# Patient Record
Sex: Female | Born: 1995
Health system: Southern US, Community
[De-identification: ages and names within clinical notes are randomized; demographics above are authoritative.]

## PROBLEM LIST (undated history)

## (undated) ENCOUNTER — Inpatient Hospital Stay (HOSPITAL_COMMUNITY): Payer: Self-pay

## (undated) DIAGNOSIS — R51 Headache: Secondary | ICD-10-CM

## (undated) DIAGNOSIS — F32A Depression, unspecified: Secondary | ICD-10-CM

## (undated) DIAGNOSIS — F329 Major depressive disorder, single episode, unspecified: Secondary | ICD-10-CM

## (undated) DIAGNOSIS — I10 Essential (primary) hypertension: Secondary | ICD-10-CM

## (undated) DIAGNOSIS — K589 Irritable bowel syndrome without diarrhea: Secondary | ICD-10-CM

## (undated) DIAGNOSIS — E282 Polycystic ovarian syndrome: Secondary | ICD-10-CM

## (undated) DIAGNOSIS — N979 Female infertility, unspecified: Secondary | ICD-10-CM

## (undated) DIAGNOSIS — F419 Anxiety disorder, unspecified: Secondary | ICD-10-CM

## (undated) DIAGNOSIS — O133 Gestational [pregnancy-induced] hypertension without significant proteinuria, third trimester: Secondary | ICD-10-CM

## (undated) DIAGNOSIS — R519 Headache, unspecified: Secondary | ICD-10-CM

## (undated) HISTORY — DX: Polycystic ovarian syndrome: E28.2

## (undated) HISTORY — PX: NO PAST SURGERIES: SHX2092

## (undated) HISTORY — DX: Gestational (pregnancy-induced) hypertension without significant proteinuria, third trimester: O13.3

## (undated) HISTORY — DX: Female infertility, unspecified: N97.9

## (undated) HISTORY — DX: Depression, unspecified: F32.A

## (undated) HISTORY — DX: Anxiety disorder, unspecified: F41.9

---
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
## 2013-02-11 ENCOUNTER — Ambulatory Visit: Payer: Self-pay | Admitting: Family Medicine

## 2014-07-14 ENCOUNTER — Encounter: Payer: Self-pay | Admitting: Obstetrics & Gynecology

## 2014-07-14 ENCOUNTER — Ambulatory Visit (INDEPENDENT_AMBULATORY_CARE_PROVIDER_SITE_OTHER): Payer: Medicaid Other | Admitting: Obstetrics & Gynecology

## 2014-07-14 VITALS — BP 120/89 | HR 91 | Wt 181.2 lb

## 2014-07-14 DIAGNOSIS — IMO0002 Reserved for concepts with insufficient information to code with codable children: Secondary | ICD-10-CM

## 2014-07-14 DIAGNOSIS — Z23 Encounter for immunization: Secondary | ICD-10-CM | POA: Diagnosis not present

## 2014-07-14 DIAGNOSIS — O3680X1 Pregnancy with inconclusive fetal viability, fetus 1: Secondary | ICD-10-CM

## 2014-07-14 DIAGNOSIS — Z9189 Other specified personal risk factors, not elsewhere classified: Secondary | ICD-10-CM

## 2014-07-14 NOTE — Progress Notes (Signed)
18 y.o. G1P0 at 2069w3d by limited clinic scan today that showed gestational and yolk sacs, here to initiate prenatal care.  No fetal pole yet. Patient was told that the formal New OB visit will occur once viability is confirmed; she will return in 2 weeks.  Flu vaccine given.  Told to continue PNVs. [facility] with multiple MDs and other Advanced Practitioners was explained to patient; also emphasized that residents, students are part of our team. Return in two weeks for repeat scan, possible new OB visit.

## 2014-07-14 NOTE — Patient Instructions (Signed)
First Trimester of Pregnancy The first trimester of pregnancy is from week 1 until the end of week 12 (months 1 through 3). A week after a sperm fertilizes an egg, the egg will implant on the wall of the uterus. This embryo will begin to develop into a baby. Genes from you and your partner are forming the baby. The female genes determine whether the baby is a boy or a girl. At 6-8 weeks, the eyes and face are formed, and the heartbeat can be seen on ultrasound. At the end of 12 weeks, all the baby's organs are formed.  Now that you are pregnant, you will want to do everything you can to have a healthy baby. Two of the most important things are to get good prenatal care and to follow your health care provider's instructions. Prenatal care is all the medical care you receive before the baby's birth. This care will help prevent, find, and treat any problems during the pregnancy and childbirth. BODY CHANGES Your body goes through many changes during pregnancy. The changes vary from woman to woman.   You may gain or lose a couple of pounds at first.  You may feel sick to your stomach (nauseous) and throw up (vomit). If the vomiting is uncontrollable, call your health care provider.  You may tire easily.  You may develop headaches that can be relieved by medicines approved by your health care provider.  You may urinate more often. Painful urination may mean you have a bladder infection.  You may develop heartburn as a result of your pregnancy.  You may develop constipation because certain hormones are causing the muscles that push waste through your intestines to slow down.  You may develop hemorrhoids or swollen, bulging veins (varicose veins).  Your breasts may begin to grow larger and become tender. Your nipples may stick out more, and the tissue that surrounds them (areola) may become darker.  Your gums may bleed and may be sensitive to brushing and flossing.  Dark spots or blotches (chloasma,  mask of pregnancy) may develop on your face. This will likely fade after the baby is born.  Your menstrual periods will stop.  You may have a loss of appetite.  You may develop cravings for certain kinds of food.  You may have changes in your emotions from day to day, such as being excited to be pregnant or being concerned that something may go wrong with the pregnancy and baby.  You may have more vivid and strange dreams.  You may have changes in your hair. These can include thickening of your hair, rapid growth, and changes in texture. Some women also have hair loss during or after pregnancy, or hair that feels dry or thin. Your hair will most likely return to normal after your baby is born. WHAT TO EXPECT AT YOUR PRENATAL VISITS During a routine prenatal visit:  You will be weighed to make sure you and the baby are growing normally.  Your blood pressure will be taken.  Your abdomen will be measured to track your baby's growth.  The fetal heartbeat will be listened to starting around week 10 or 12 of your pregnancy.  Test results from any previous visits will be discussed. Your health care provider may ask you:  How you are feeling.  If you are feeling the baby move.  If you have had any abnormal symptoms, such as leaking fluid, bleeding, severe headaches, or abdominal cramping.  If you have any questions. Other tests   that may be performed during your first trimester include:  Blood tests to find your blood type and to check for the presence of any previous infections. They will also be used to check for low iron levels (anemia) and Rh antibodies. Later in the pregnancy, blood tests for diabetes will be done along with other tests if problems develop.  Urine tests to check for infections, diabetes, or protein in the urine.  An ultrasound to confirm the proper growth and development of the baby.  An amniocentesis to check for possible genetic problems.  Fetal screens for  spina bifida and Down syndrome.  You may need other tests to make sure you and the baby are doing well. HOME CARE INSTRUCTIONS  Medicines  Follow your health care provider's instructions regarding medicine use. Specific medicines may be either safe or unsafe to take during pregnancy.  Take your prenatal vitamins as directed.  If you develop constipation, try taking a stool softener if your health care provider approves. Diet  Eat regular, well-balanced meals. Choose a variety of foods, such as meat or vegetable-based protein, fish, milk and low-fat dairy products, vegetables, fruits, and whole grain breads and cereals. Your health care provider will help you determine the amount of weight gain that is right for you.  Avoid raw meat and uncooked cheese. These carry germs that can cause birth defects in the baby.  Eating four or five small meals rather than three large meals a day may help relieve nausea and vomiting. If you start to feel nauseous, eating a few soda crackers can be helpful. Drinking liquids between meals instead of during meals also seems to help nausea and vomiting.  If you develop constipation, eat more high-fiber foods, such as fresh vegetables or fruit and whole grains. Drink enough fluids to keep your urine clear or pale yellow. Activity and Exercise  Exercise only as directed by your health care provider. Exercising will help you:  Control your weight.  Stay in shape.  Be prepared for labor and delivery.  Experiencing pain or cramping in the lower abdomen or low back is a good sign that you should stop exercising. Check with your health care provider before continuing normal exercises.  Try to avoid standing for long periods of time. Move your legs often if you must stand in one place for a long time.  Avoid heavy lifting.  Wear low-heeled shoes, and practice good posture.  You may continue to have sex unless your health care provider directs you  otherwise. Relief of Pain or Discomfort  Wear a good support bra for breast tenderness.   Take warm sitz baths to soothe any pain or discomfort caused by hemorrhoids. Use hemorrhoid cream if your health care provider approves.   Rest with your legs elevated if you have leg cramps or low back pain.  If you develop varicose veins in your legs, wear support hose. Elevate your feet for 15 minutes, 3-4 times a day. Limit salt in your diet. Prenatal Care  Schedule your prenatal visits by the twelfth week of pregnancy. They are usually scheduled monthly at first, then more often in the last 2 months before delivery.  Write down your questions. Take them to your prenatal visits.  Keep all your prenatal visits as directed by your health care provider. Safety  Wear your seat belt at all times when driving.  Make a list of emergency phone numbers, including numbers for family, friends, the hospital, and police and fire departments. General Tips    Ask your health care provider for a referral to a local prenatal education class. Begin classes no later than at the beginning of month 6 of your pregnancy.  Ask for help if you have counseling or nutritional needs during pregnancy. Your health care provider can offer advice or refer you to specialists for help with various needs.  Do not use hot tubs, steam rooms, or saunas.  Do not douche or use tampons or scented sanitary pads.  Do not cross your legs for long periods of time.  Avoid cat litter boxes and soil used by cats. These carry germs that can cause birth defects in the baby and possibly loss of the fetus by miscarriage or stillbirth.  Avoid all smoking, herbs, alcohol, and medicines not prescribed by your health care provider. Chemicals in these affect the formation and growth of the baby.  Schedule a dentist appointment. At home, brush your teeth with a soft toothbrush and be gentle when you floss. SEEK MEDICAL CARE IF:   You have  dizziness.  You have mild pelvic cramps, pelvic pressure, or nagging pain in the abdominal area.  You have persistent nausea, vomiting, or diarrhea.  You have a bad smelling vaginal discharge.  You have pain with urination.  You notice increased swelling in your face, hands, legs, or ankles. SEEK IMMEDIATE MEDICAL CARE IF:   You have a fever.  You are leaking fluid from your vagina.  You have spotting or bleeding from your vagina.  You have severe abdominal cramping or pain.  You have rapid weight gain or loss.  You vomit blood or material that looks like coffee grounds.  You are exposed to German measles and have never had them.  You are exposed to fifth disease or chickenpox.  You develop a severe headache.  You have shortness of breath.  You have any kind of trauma, such as from a fall or a car accident. Document Released: 07/19/2001 Document Revised: 12/09/2013 Document Reviewed: 06/04/2013 Exit[employer] Patient Information 2015 [employer]. This information is not intended to replace advice given to you by your health care provider. Make sure you discuss any questions you have with your health care provider.  

## 2014-07-14 NOTE — Progress Notes (Signed)
Bedside ultrasound shows gestational sac measuring 5 weeks and 3 days.  Yolk sac is present but fetal pole is not identified.

## 2014-07-16 ENCOUNTER — Inpatient Hospital Stay (HOSPITAL_COMMUNITY): Payer: Medicaid Other

## 2014-07-16 ENCOUNTER — Encounter (HOSPITAL_COMMUNITY): Payer: Self-pay | Admitting: *Deleted

## 2014-07-16 ENCOUNTER — Inpatient Hospital Stay (HOSPITAL_COMMUNITY)
Admission: AD | Admit: 2014-07-16 | Discharge: 2014-07-16 | Disposition: A | Discharge status: Home / Self Care | Payer: Medicaid Other | Source: Ambulatory Visit | Attending: Obstetrics & Gynecology | Admitting: Obstetrics & Gynecology

## 2014-07-16 DIAGNOSIS — R109 Unspecified abdominal pain: Secondary | ICD-10-CM | POA: Diagnosis present

## 2014-07-16 DIAGNOSIS — K589 Irritable bowel syndrome without diarrhea: Secondary | ICD-10-CM | POA: Diagnosis not present

## 2014-07-16 DIAGNOSIS — O209 Hemorrhage in early pregnancy, unspecified: Secondary | ICD-10-CM

## 2014-07-16 DIAGNOSIS — O99611 Diseases of the digestive system complicating pregnancy, first trimester: Secondary | ICD-10-CM | POA: Insufficient documentation

## 2014-07-16 DIAGNOSIS — O208 Other hemorrhage in early pregnancy: Secondary | ICD-10-CM | POA: Diagnosis not present

## 2014-07-16 DIAGNOSIS — Z3A01 Less than 8 weeks gestation of pregnancy: Secondary | ICD-10-CM | POA: Diagnosis not present

## 2014-07-16 DIAGNOSIS — O26899 Other specified pregnancy related conditions, unspecified trimester: Secondary | ICD-10-CM

## 2014-07-16 DIAGNOSIS — O9989 Other specified diseases and conditions complicating pregnancy, childbirth and the puerperium: Secondary | ICD-10-CM

## 2014-07-16 LAB — URINALYSIS, ROUTINE W REFLEX MICROSCOPIC
Bilirubin Urine: NEGATIVE
Glucose, UA: NEGATIVE mg/dL
Ketones, ur: NEGATIVE mg/dL
LEUKOCYTES UA: NEGATIVE
Nitrite: NEGATIVE
PROTEIN: NEGATIVE mg/dL
SPECIFIC GRAVITY, URINE: 1.025 (ref 1.005–1.030)
UROBILINOGEN UA: 0.2 mg/dL (ref 0.0–1.0)
pH: 5.5 (ref 5.0–8.0)

## 2014-07-16 LAB — CBC WITH DIFFERENTIAL/PLATELET
BASOS ABS: 0.1 10*3/uL (ref 0.0–0.1)
Basophils Relative: 1 % (ref 0–1)
EOS PCT: 22 % — AB (ref 0–5)
Eosinophils Absolute: 3.1 10*3/uL — ABNORMAL HIGH (ref 0.0–0.7)
HCT: 35.3 % — ABNORMAL LOW (ref 36.0–46.0)
Hemoglobin: 12 g/dL (ref 12.0–15.0)
LYMPHS ABS: 3.1 10*3/uL (ref 0.7–4.0)
LYMPHS PCT: 23 % (ref 12–46)
MCH: 28.7 pg (ref 26.0–34.0)
MCHC: 34 g/dL (ref 30.0–36.0)
MCV: 84.4 fL (ref 78.0–100.0)
Monocytes Absolute: 0.8 10*3/uL (ref 0.1–1.0)
Monocytes Relative: 6 % (ref 3–12)
NEUTROS ABS: 6.7 10*3/uL (ref 1.7–7.7)
NEUTROS PCT: 48 % (ref 43–77)
PLATELETS: 330 10*3/uL (ref 150–400)
RBC: 4.18 MIL/uL (ref 3.87–5.11)
RDW: 13.3 % (ref 11.5–15.5)
WBC: 13.7 10*3/uL — AB (ref 4.0–10.5)

## 2014-07-16 LAB — URINE MICROSCOPIC-ADD ON

## 2014-07-16 LAB — POCT PREGNANCY, URINE: PREG TEST UR: POSITIVE — AB

## 2014-07-16 LAB — WET PREP, GENITAL
Trich, Wet Prep: NONE SEEN
Yeast Wet Prep HPF POC: NONE SEEN

## 2014-07-16 LAB — HCG, QUANTITATIVE, PREGNANCY: HCG, BETA CHAIN, QUANT, S: 12450 m[IU]/mL — AB (ref <5)

## 2014-07-16 NOTE — MAU Note (Signed)
Pt having cramping x 2 days, spotting today.  IVF pregnancy.

## 2014-07-16 NOTE — Discharge Instructions (Signed)
Subchorionic Hematoma °A subchorionic hematoma is a gathering of blood between the outer wall of the placenta and the inner wall of the womb (uterus). The placenta is the organ that connects the fetus to the wall of the uterus. The placenta performs the feeding, breathing (oxygen to the fetus), and waste removal (excretory work) of the fetus.  °Subchorionic hematoma is the most common abnormality found on a result from ultrasonography done during the first trimester or early second trimester of pregnancy. If there has been little or no vaginal bleeding, early small hematomas usually shrink on their own and do not affect your baby or pregnancy. The blood is gradually absorbed over 1-2 weeks. When bleeding starts later in pregnancy or the hematoma is larger or occurs in an older pregnant woman, the outcome may not be as good. Larger hematomas may get bigger, which increases the chances for miscarriage. Subchorionic hematoma also increases the risk of premature detachment of the placenta from the uterus, preterm (premature) labor, and stillbirth. °HOME CARE INSTRUCTIONS °· Stay on bed rest if your health care provider recommends this. Although bed rest will not prevent more bleeding or prevent a miscarriage, your health care provider may recommend bed rest until you are advised otherwise. °· Avoid heavy lifting (more than 10 lb [4.5 kg]), exercise, sexual intercourse, or douching as directed by your health care provider. °· Keep track of the number of pads you use each day and how soaked (saturated) they are. Write down this information. °· Do not use tampons. °· Keep all follow-up appointments as directed by your health care provider. Your health care provider may ask you to have follow-up blood tests or ultrasound tests or both. °SEEK IMMEDIATE MEDICAL CARE IF: °· You have severe cramps in your stomach, back, abdomen, or pelvis. °· You have a fever. °· You pass large clots or tissue. Save any tissue for your health  care provider to look at. °· Your bleeding increases or you become lightheaded, feel weak, or have fainting episodes. °Document Released: 11/09/2006 Document Revised: 12/09/2013 Document Reviewed: 02/21/2013 °ExitCare® Patient Information ©2015 [employer]. This information is not intended to replace advice given to you by your health care provider. Make sure you discuss any questions you have with your health care provider. °First Trimester of Pregnancy °The first trimester of pregnancy is from week 1 until the end of week 12 (months 1 through 3). During this time, your baby will begin to develop inside you. At 6-8 weeks, the eyes and face are formed, and the heartbeat can be seen on ultrasound. At the end of 12 weeks, all the baby's organs are formed. Prenatal care is all the medical care you receive before the birth of your baby. Make sure you get good prenatal care and follow all of your doctor's instructions. °HOME CARE  °Medicines °· Take medicine only as told by your doctor. Some medicines are safe and some are not during pregnancy. °· Take your prenatal vitamins as told by your doctor. °· Take medicine that helps you poop (stool softener) as needed if your doctor says it is okay. °Diet °· Eat regular, healthy meals. °· Your doctor will tell you the amount of weight gain that is right for you. °· Avoid raw meat and uncooked cheese. °· If you feel sick to your stomach (nauseous) or throw up (vomit): °· Eat 4 or 5 small meals a day instead of 3 large meals. °· Try eating a few soda crackers. °· Drink liquids between meals   instead of during meals. °· If you have a hard time pooping (constipation): °· Eat high-fiber foods like fresh vegetables, fruit, and whole grains. °· Drink enough fluids to keep your pee (urine) clear or pale yellow. °Activity and Exercise °· Exercise only as told by your doctor. Stop exercising if you have cramps or pain in your lower belly (abdomen) or low back. °· Try to avoid standing  for long periods of time. Move your legs often if you must stand in one place for a long time. °· Avoid heavy lifting. °· Wear low-heeled shoes. Sit and stand up straight. °· You can have sex unless your doctor tells you not to. °Relief of Pain or Discomfort °· Wear a good support bra if your breasts are sore. °· Take warm water baths (sitz baths) to soothe pain or discomfort caused by hemorrhoids. Use hemorrhoid cream if your doctor says it is okay. °· Rest with your legs raised if you have leg cramps or low back pain. °· Wear support hose if you have puffy, bulging veins (varicose veins) in your legs. Raise (elevate) your feet for 15 minutes, 3-4 times a day. Limit salt in your diet. °Prenatal Care °· Schedule your prenatal visits by the twelfth week of pregnancy. °· Write down your questions. Take them to your prenatal visits. °· Keep all your prenatal visits as told by your doctor. °Safety °· Wear your seat belt at all times when driving. °· Make a list of emergency phone numbers. The list should include numbers for family, friends, the hospital, and police and fire departments. °General Tips °· Ask your doctor for a referral to a local prenatal class. Begin classes no later than at the start of month 6 of your pregnancy. °· Ask for help if you need counseling or help with nutrition. Your doctor can give you advice or tell you where to go for help. °· Do not use hot tubs, steam rooms, or saunas. °· Do not douche or use tampons or scented sanitary pads. °· Do not cross your legs for long periods of time. °· Avoid litter boxes and soil used by cats. °· Avoid all smoking, herbs, and alcohol. Avoid drugs not approved by your doctor. °· Visit your dentist. At home, brush your teeth with a soft toothbrush. Be gentle when you floss. °GET HELP IF: °· You are dizzy. °· You have mild cramps or pressure in your lower belly. °· You have a nagging pain in your belly area. °· You continue to feel sick to your stomach, throw  up, or have watery poop (diarrhea). °· You have a bad smelling fluid coming from your vagina. °· You have pain with peeing (urination). °· You have increased puffiness (swelling) in your face, hands, legs, or ankles. °GET HELP RIGHT AWAY IF:  °· You have a fever. °· You are leaking fluid from your vagina. °· You have spotting or bleeding from your vagina. °· You have very bad belly cramping or pain. °· You gain or lose weight rapidly. °· You throw up blood. It may look like coffee grounds. °· You are around people who have German measles, fifth disease, or chickenpox. °· You have a very bad headache. °· You have shortness of breath. °· You have any kind of trauma, such as from a fall or a car accident. °Document Released: 01/11/2008 Document Revised: 12/09/2013 Document Reviewed: 06/04/2013 °ExitCare® Patient Information ©2015 [employer]. This information is not intended to replace advice given to you by your health   care provider. Make sure you discuss any questions you have with your health care provider. ° °

## 2014-07-16 NOTE — MAU Provider Note (Signed)
History     CSN: [id number]  Arrival date and time: 07/16/14 4098   First Provider Initiated Contact with Patient 07/16/14 2142      Chief Complaint  Patient presents with  . Abdominal Cramping  . Vaginal Bleeding   HPI  [name] is a 18 y.o. G1P0 at [redacted]w[redacted]d who presents to MAU today with complaint of lower abdominal cramping and vaginal bleeding. Patient states that cramping started yesterday and she then noted spotting tonight. She states dark red blood. She denies other vaginal discharge, UTI symptoms or fever. She states mild nausea without vomiting, diarrhea or constipation. Patient recently [facility] care with [facility]. She denies intercourse recently.   OB History    Gravida Para Term Preterm AB TAB SAB Ectopic Multiple Living   1               Past Medical History  Diagnosis Date  . IBS (irritable bowel syndrome)   . Infertility, female   . PCOS (polycystic ovarian syndrome)     No past surgical history on file.  History reviewed. No pertinent family history.  History  Substance Use Topics  . Smoking status: Never Smoker   . Smokeless tobacco: Never Used  . Alcohol Use: No    Allergies: No Known Allergies  No prescriptions prior to admission    Review of Systems  Constitutional: Negative for fever and malaise/fatigue.  Gastrointestinal: Positive for nausea and abdominal pain. Negative for vomiting, diarrhea and constipation.  Genitourinary: Negative for dysuria, urgency and frequency.       + vaginal bleeding Neg - vaginal discharge   Physical Exam   Blood pressure 132/69, pulse 99, temperature 98.2 F (36.8 C), temperature source Oral, resp. rate 16, height 5\' 1"  (1.549 m), weight 181 lb 12.8 oz (82.464 kg), last menstrual period 06/07/2014.  Physical Exam  Constitutional: She is oriented to person, place, and time. She appears well-developed and well-nourished. No distress.  HENT:  Head:  Normocephalic.  Cardiovascular: Normal rate.   Respiratory: Effort normal.  Genitourinary: Uterus is not enlarged (exam limited by maternal body habitus) and not tender. Cervix exhibits friability (mild). Cervix exhibits no motion tenderness and no discharge. Right adnexum displays no mass and no tenderness. Left adnexum displays no mass and no tenderness. There is bleeding (scant light brown blood noted) in the vagina. Vaginal discharge (scant mucous discharge noted) found.  Neurological: She is alert and oriented to person, place, and time.  Skin: Skin is warm and dry. No erythema.  Psychiatric: She has a normal mood and affect.   Results for orders placed or performed during the hospital encounter of 07/16/14 (from the past 24 hour(s))  Urinalysis, Routine w reflex microscopic     Status: Abnormal   Collection Time: 07/16/14  8:40 PM  Result Value Ref Range   Color, Urine YELLOW YELLOW   APPearance CLEAR CLEAR   Specific Gravity, Urine 1.025 1.005 - 1.030   pH 5.5 5.0 - 8.0   Glucose, UA NEGATIVE NEGATIVE mg/dL   Hgb urine dipstick TRACE (A) NEGATIVE   Bilirubin Urine NEGATIVE NEGATIVE   Ketones, ur NEGATIVE NEGATIVE mg/dL   Protein, ur NEGATIVE NEGATIVE mg/dL   Urobilinogen, UA 0.2 0.0 - 1.0 mg/dL   Nitrite NEGATIVE NEGATIVE   Leukocytes, UA NEGATIVE NEGATIVE  Urine microscopic-add on     Status: Abnormal   Collection Time: 07/16/14  8:40 PM  Result Value Ref Range   Squamous Epithelial /  LPF FEW (A) RARE   WBC, UA 3-6 <3 WBC/hpf   RBC / HPF 3-6 <3 RBC/hpf   Bacteria, UA FEW (A) RARE  Pregnancy, urine POC     Status: Abnormal   Collection Time: 07/16/14  8:49 PM  Result Value Ref Range   Preg Test, Ur POSITIVE (A) NEGATIVE  CBC with Differential     Status: Abnormal   Collection Time: 07/16/14  9:42 PM  Result Value Ref Range   WBC 13.7 (H) 4.0 - 10.5 K/uL   RBC 4.18 3.87 - 5.11 MIL/uL   Hemoglobin 12.0 12.0 - 15.0 g/dL   HCT 16.135.3 (L) 09.636.0 - 04.546.0 %   MCV 84.4 78.0 -  100.0 fL   MCH 28.7 26.0 - 34.0 pg   MCHC 34.0 30.0 - 36.0 g/dL   RDW 40.913.3 81.111.5 - 91.415.5 %   Platelets 330 150 - 400 K/uL   Neutrophils Relative % 48 43 - 77 %   Neutro Abs 6.7 1.7 - 7.7 K/uL   Lymphocytes Relative 23 12 - 46 %   Lymphs Abs 3.1 0.7 - 4.0 K/uL   Monocytes Relative 6 3 - 12 %   Monocytes Absolute 0.8 0.1 - 1.0 K/uL   Eosinophils Relative 22 (H) 0 - 5 %   Eosinophils Absolute 3.1 (H) 0.0 - 0.7 K/uL   Basophils Relative 1 0 - 1 %   Basophils Absolute 0.1 0.0 - 0.1 K/uL  ABO/Rh     Status: None (Preliminary result)   Collection Time: 07/16/14  9:42 PM  Result Value Ref Range   ABO/RH(D) O POS   hCG, quantitative, pregnancy     Status: Abnormal   Collection Time: 07/16/14  9:48 PM  Result Value Ref Range   hCG, Beta Chain, Quant, S 12450 (H) <5 mIU/mL  Wet prep, genital     Status: Abnormal   Collection Time: 07/16/14  9:53 PM  Result Value Ref Range   Yeast Wet Prep HPF POC NONE SEEN NONE SEEN   Trich, Wet Prep NONE SEEN NONE SEEN   Clue Cells Wet Prep HPF POC FEW (A) NONE SEEN   WBC, Wet Prep HPF POC FEW (A) NONE SEEN   [place] Ob Comp Less 14 Wks  07/16/2014   CLINICAL DATA:  Vaginal bleeding, pregnancy, in vitro fertilization, EGA [redacted] weeks 4 days  EXAM: OBSTETRIC <14 WK US AND TRANSVAGINAL OB US  TECHNIQUE: Both transabdominal and transvaginal ultrasound examinations were performed for complete evaluation of the gestation as well as the maternal uterus, adnexal regions, and pelvic cul-de-sac. Transvaginal technique was performed to assess early pregnancy.  COMPARISON:  None  FINDINGS: Intrauterine gestational sac: Visualized/normal in shape.  Yolk sac:  Present  Embryo:  Present  Cardiac Activity: Present  Heart Rate:  100 bpm  CRL:   2.1  mm   5 w 6 d                  US EDC: 03/11/2015  Maternal uterus/adnexae: Small subchorionic hemorrhage.  LEFT ovary normal size and morphology, 2.3 x 4.6 x 1.9 cm.  RIGHT ovary normal size and morphology, 3.1 x 3.0 x 2.5 cm.  No adnexal  masses.  Trace free pelvic fluid.  IMPRESSION: Single live intrauterine gestational measured at 5 weeks 6 days EGA by crown-rump length.  Small subchorionic hemorrhage.   Electronically Signed   By: Ulyses SouthwardMark  Boles M.D.   On: 07/16/2014 22:39   [place] Ob Transvaginal  07/16/2014   CLINICAL  DATA:  Vaginal bleeding, pregnancy, in vitro fertilization, EGA [redacted] weeks 4 days  EXAM: OBSTETRIC <14 WK US AND TRANSVAGINAL OB US  TECHNIQUE: Both transabdominal and transvaginal ultrasound examinations were performed for complete evaluation of the gestation as well as the maternal uterus, adnexal regions, and pelvic cul-de-sac. Transvaginal technique was performed to assess early pregnancy.  COMPARISON:  None  FINDINGS: Intrauterine gestational sac: Visualized/normal in shape.  Yolk sac:  Present  Embryo:  Present  Cardiac Activity: Present  Heart Rate:  100 bpm  CRL:   2.1  mm   5 w 6 d                  US EDC: 03/11/2015  Maternal uterus/adnexae: Small subchorionic hemorrhage.  LEFT ovary normal size and morphology, 2.3 x 4.6 x 1.9 cm.  RIGHT ovary normal size and morphology, 3.1 x 3.0 x 2.5 cm.  No adnexal masses.  Trace free pelvic fluid.  IMPRESSION: Single live intrauterine gestational measured at 5 weeks 6 days EGA by crown-rump length.  Small subchorionic hemorrhage.   Electronically Signed   By: Ulyses SouthwardMark  Boles M.D.   On: 07/16/2014 22:39    MAU Course  Procedures None  MDM +UPT UA, wet prep, GC/Chlamydia, CBC, ABO/Rh, quant hCG, HIV and US today  Assessment and Plan  A: SIUP at [id number] Small subchorionic hemorrhage  P: Discharge home Bleeding and first trimester warning signs/precautions discussed Patient advised [facility] as scheduled Patient may return to MAU as needed or if her condition were to change or worsen   Marny LowensteinJulie N Wenzel, PA-C  07/16/2014, 11:16 PM

## 2014-07-17 LAB — ABO/RH: ABO/RH(D): O POS

## 2014-07-17 LAB — GC/CHLAMYDIA PROBE AMP
CT PROBE, AMP APTIMA: NEGATIVE
GC PROBE AMP APTIMA: NEGATIVE

## 2014-07-17 LAB — HIV ANTIBODY (ROUTINE TESTING W REFLEX): HIV 1&2 Ab, 4th Generation: NONREACTIVE

## 2014-07-20 ENCOUNTER — Inpatient Hospital Stay (HOSPITAL_COMMUNITY): Payer: Medicaid Other

## 2014-07-20 ENCOUNTER — Encounter (HOSPITAL_COMMUNITY): Payer: Self-pay | Admitting: *Deleted

## 2014-07-20 ENCOUNTER — Inpatient Hospital Stay (HOSPITAL_COMMUNITY)
Admission: AD | Admit: 2014-07-20 | Discharge: 2014-07-21 | Disposition: A | Discharge status: Home / Self Care | Payer: Medicaid Other | Source: Ambulatory Visit | Attending: Obstetrics and Gynecology | Admitting: Obstetrics and Gynecology

## 2014-07-20 DIAGNOSIS — Z3A01 Less than 8 weeks gestation of pregnancy: Secondary | ICD-10-CM | POA: Insufficient documentation

## 2014-07-20 DIAGNOSIS — R109 Unspecified abdominal pain: Secondary | ICD-10-CM | POA: Diagnosis present

## 2014-07-20 DIAGNOSIS — O209 Hemorrhage in early pregnancy, unspecified: Secondary | ICD-10-CM | POA: Insufficient documentation

## 2014-07-20 LAB — URINALYSIS, ROUTINE W REFLEX MICROSCOPIC
Bilirubin Urine: NEGATIVE
GLUCOSE, UA: NEGATIVE mg/dL
KETONES UR: NEGATIVE mg/dL
LEUKOCYTES UA: NEGATIVE
Nitrite: NEGATIVE
PROTEIN: NEGATIVE mg/dL
Specific Gravity, Urine: 1.015 (ref 1.005–1.030)
UROBILINOGEN UA: 0.2 mg/dL (ref 0.0–1.0)
pH: 6 (ref 5.0–8.0)

## 2014-07-20 LAB — URINE MICROSCOPIC-ADD ON

## 2014-07-20 NOTE — MAU Note (Signed)
Abdominal cramping & vaginal bleeding worse today. Was seen on Wednesday for same complaint. Having small blood clots now.

## 2014-07-20 NOTE — MAU Provider Note (Signed)
History     CSN: [id number]  Arrival date and time: 07/20/14 2232   First Provider Initiated Contact with Patient 07/20/14 2313      Chief Complaint  Patient presents with  . Vaginal Bleeding  . Abdominal Cramping   HPI  Chelsea Ferguson is a 18 y.o. a G1P0 at 439w1d who presents today with bleeding, and cramping. She states that the bleeding started around 2100. She states that it was heavy, and with small blood clots. She states that the clots were about the size of a grain of rice.   Past Medical History  Diagnosis Date  . IBS (irritable bowel syndrome)   . Infertility, female   . PCOS (polycystic ovarian syndrome)     History reviewed. No pertinent past surgical history.  No family history on file.  History  Substance Use Topics  . Smoking status: Never Smoker   . Smokeless tobacco: Never Used  . Alcohol Use: No    Allergies: No Known Allergies  Prescriptions prior to admission  Medication Sig Dispense Refill Last Dose  . aspirin 81 MG chewable tablet Chew 81 mg by mouth daily.   07/19/2014 at Unknown time  . estradiol valerate (DELESTROGEN) 20 MG/ML injection Inject 20 mg into the muscle 2 (two) times a week. .43mg  every Monday and Friday   Past Week at Unknown time  . hydroxyprogesterone caproate (DELALUTIN) 250 mg/mL OIL injection Inject 250 mg into the muscle daily.    07/19/2014 at Unknown time  . Prenatal Vit-Min-FA-Fish Oil ([facility] PRENATAL GUMMY PO) Take 2 tablets by mouth daily.   07/19/2014 at Unknown time    ROS Physical Exam   Blood pressure 137/87, pulse 87, temperature 98.1 F (36.7 C), temperature source Oral, resp. rate 18, last menstrual period 06/07/2014, SpO2 100 %.  Physical Exam  Nursing note and vitals reviewed. Constitutional: She is oriented to person, place, and time. She appears well-developed and well-nourished. No distress.  Cardiovascular: Normal rate.   Respiratory: Effort normal.  GI: Soft. There is no tenderness. There is no  rebound.  Genitourinary:   External: no lesion Vagina: small amount of blood seen  Cervix: pink, smooth, no CMT Uterus: NSSC Adnexa: NT   Neurological: She is alert and oriented to person, place, and time.  Skin: Skin is warm and dry.  Psychiatric: She has a normal mood and affect.    MAU Course  Procedures  [place] Ob Transvaginal  07/21/2014   CLINICAL DATA:  Vaginal bleeding and pregnancy, first trimester.  EXAM: TRANSVAGINAL OB ULTRASOUND  TECHNIQUE: Transvaginal ultrasound was performed for complete evaluation of the gestation as well as the maternal uterus, adnexal regions, and pelvic cul-de-sac.  COMPARISON:  July 16, 2014.  FINDINGS: Intrauterine gestational sac: Visualized/normal in shape.  Yolk sac:  Visualized.  Embryo:  Visualized.  Cardiac Activity: Visualized.  Heart Rate: 120 bpm  CRL:   6.8  mm   6 w 4 d                  US EDC: March 11, 2015.  Maternal uterus/adnexae: Both ovaries appear normal. Trace free fluid is noted most likely physiologic.  IMPRESSION: Single live intrauterine gestation of 6 weeks 4 days.   Electronically Signed   By: Roque LiasJames  Green M.D.   On: 07/21/2014 00:03     Assessment and Plan   1. Vaginal bleeding in pregnancy, first trimester    Bleeding precautions Return to MAU as needed  Follow-up Information  [facility]   Specialty:  Obstetrics and Gynecology   Why:  As scheduled   Contact information:   [address] [place] [id number] [phone]       Tawnya CrookHogan, Heather Donovan 07/20/2014, 11:16 PM

## 2014-07-21 DIAGNOSIS — O4691 Antepartum hemorrhage, unspecified, first trimester: Secondary | ICD-10-CM

## 2014-07-21 NOTE — Discharge Instructions (Signed)
Vaginal Bleeding During Pregnancy, First Trimester  A small amount of bleeding (spotting) from the vagina is relatively common in early pregnancy. It usually stops on its own. Various things may cause bleeding or spotting in early pregnancy. Some bleeding may be related to the pregnancy, and some may not. In most cases, the bleeding is normal and is not a problem. However, bleeding can also be a sign of something serious. Be sure to tell your health care provider about any vaginal bleeding right away.  Some possible causes of vaginal bleeding during the first trimester include:  · Infection or inflammation of the cervix.  · Growths (polyps) on the cervix.  · Miscarriage or threatened miscarriage.  · Pregnancy tissue has developed outside of the uterus and in a fallopian tube (tubal pregnancy).  · Tiny cysts have developed in the uterus instead of pregnancy tissue (molar pregnancy).  HOME CARE INSTRUCTIONS   Watch your condition for any changes. The following actions may help to lessen any discomfort you are feeling:  · Follow your health care provider's instructions for limiting your activity. If your health care provider orders bed rest, you may need to stay in bed and only get up to use the bathroom. However, your health care provider may allow you to continue light activity.  · If needed, make plans for someone to help with your regular activities and responsibilities while you are on bed rest.  · Keep track of the number of pads you use each day, how often you change pads, and how soaked (saturated) they are. Write this down.  · Do not use tampons. Do not douche.  · Do not have sexual intercourse or orgasms until approved by your health care provider.  · If you pass any tissue from your vagina, save the tissue so you can show it to your health care provider.  · Only take over-the-counter or prescription medicines as directed by your health care provider.  · Do not take aspirin because it can make you  bleed.  · Keep all follow-up appointments as directed by your health care provider.  SEEK MEDICAL CARE IF:  · You have any vaginal bleeding during any part of your pregnancy.  · You have cramps or labor pains.  · You have a fever, not controlled by medicine.  SEEK IMMEDIATE MEDICAL CARE IF:   · You have severe cramps in your back or belly (abdomen).  · You pass large clots or tissue from your vagina.  · Your bleeding increases.  · You feel light-headed or weak, or you have fainting episodes.  · You have chills.  · You are leaking fluid or have a gush of fluid from your vagina.  · You pass out while having a bowel movement.  MAKE SURE YOU:  · Understand these instructions.  · Will watch your condition.  · Will get help right away if you are not doing well or get worse.  Document Released: 05/04/2005 Document Revised: 07/30/2013 Document Reviewed: 04/01/2013  [employer]® Patient Information ©2015 [employer]. This information is not intended to replace advice given to you by your health care provider. Make sure you discuss any questions you have with your health care provider.

## 2014-07-28 ENCOUNTER — Ambulatory Visit (INDEPENDENT_AMBULATORY_CARE_PROVIDER_SITE_OTHER): Payer: Medicaid Other | Admitting: Obstetrics and Gynecology

## 2014-07-28 ENCOUNTER — Other Ambulatory Visit: Payer: Self-pay | Admitting: Obstetrics and Gynecology

## 2014-07-28 ENCOUNTER — Encounter: Payer: Self-pay | Admitting: Obstetrics and Gynecology

## 2014-07-28 VITALS — BP 120/74 | HR 88 | Wt 181.0 lb

## 2014-07-28 DIAGNOSIS — Z3682 Encounter for antenatal screening for nuchal translucency: Secondary | ICD-10-CM

## 2014-07-28 DIAGNOSIS — N970 Female infertility associated with anovulation: Secondary | ICD-10-CM

## 2014-07-28 DIAGNOSIS — Z3401 Encounter for supervision of normal first pregnancy, first trimester: Secondary | ICD-10-CM

## 2014-07-28 DIAGNOSIS — Z3400 Encounter for supervision of normal first pregnancy, unspecified trimester: Secondary | ICD-10-CM | POA: Insufficient documentation

## 2014-07-28 DIAGNOSIS — O99210 Obesity complicating pregnancy, unspecified trimester: Secondary | ICD-10-CM | POA: Insufficient documentation

## 2014-07-28 DIAGNOSIS — O99211 Obesity complicating pregnancy, first trimester: Secondary | ICD-10-CM

## 2014-07-28 DIAGNOSIS — E669 Obesity, unspecified: Secondary | ICD-10-CM | POA: Insufficient documentation

## 2014-07-28 HISTORY — DX: Female infertility associated with anovulation: N97.0

## 2014-07-28 HISTORY — DX: Obesity complicating pregnancy, unspecified trimester: O99.210

## 2014-07-28 HISTORY — DX: Encounter for supervision of normal first pregnancy, unspecified trimester: Z34.00

## 2014-07-28 NOTE — Patient Instructions (Signed)
First Trimester of Pregnancy The first trimester of pregnancy is from week 1 until the end of week 12 (months 1 through 3). A week after a sperm fertilizes an egg, the egg will implant on the wall of the uterus. This embryo will begin to develop into a baby. Genes from you and your partner are forming the baby. The female genes determine whether the baby is a boy or a girl. At 6-8 weeks, the eyes and face are formed, and the heartbeat can be seen on ultrasound. At the end of 12 weeks, all the baby's organs are formed.  Now that you are pregnant, you will want to do everything you can to have a healthy baby. Two of the most important things are to get good prenatal care and to follow your health care provider's instructions. Prenatal care is all the medical care you receive before the baby's birth. This care will help prevent, find, and treat any problems during the pregnancy and childbirth. BODY CHANGES Your body goes through many changes during pregnancy. The changes vary from woman to woman.   You may gain or lose a couple of pounds at first.  You may feel sick to your stomach (nauseous) and throw up (vomit). If the vomiting is uncontrollable, call your health care provider.  You may tire easily.  You may develop headaches that can be relieved by medicines approved by your health care provider.  You may urinate more often. Painful urination may mean you have a bladder infection.  You may develop heartburn as a result of your pregnancy.  You may develop constipation because certain hormones are causing the muscles that push waste through your intestines to slow down.  You may develop hemorrhoids or swollen, bulging veins (varicose veins).  Your breasts may begin to grow larger and become tender. Your nipples may stick out more, and the tissue that surrounds them (areola) may become darker.  Your gums may bleed and may be sensitive to brushing and flossing.  Dark spots or blotches  (chloasma, mask of pregnancy) may develop on your face. This will likely fade after the baby is born.  Your menstrual periods will stop.  You may have a loss of appetite.  You may develop cravings for certain kinds of food.  You may have changes in your emotions from day to day, such as being excited to be pregnant or being concerned that something may go wrong with the pregnancy and baby.  You may have more vivid and strange dreams.  You may have changes in your hair. These can include thickening of your hair, rapid growth, and changes in texture. Some women also have hair loss during or after pregnancy, or hair that feels dry or thin. Your hair will most likely return to normal after your baby is born. WHAT TO EXPECT AT YOUR PRENATAL VISITS During a routine prenatal visit:  You will be weighed to make sure you and the baby are growing normally.  Your blood pressure will be taken.  Your abdomen will be measured to track your baby's growth.  The fetal heartbeat will be listened to starting around week 10 or 12 of your pregnancy.  Test results from any previous visits will be discussed. Your health care provider may ask you:  How you are feeling.  If you are feeling the baby move.  If you have had any abnormal symptoms, such as leaking fluid, bleeding, severe headaches, or abdominal cramping.  If you have any questions. Other tests   that may be performed during your first trimester include:  Blood tests to find your blood type and to check for the presence of any previous infections. They will also be used to check for low iron levels (anemia) and Rh antibodies. Later in the pregnancy, blood tests for diabetes will be done along with other tests if problems develop.  Urine tests to check for infections, diabetes, or protein in the urine.  An ultrasound to confirm the proper growth and development of the baby.  An amniocentesis to check for possible genetic problems.  Fetal  screens for spina bifida and Down syndrome.  You may need other tests to make sure you and the baby are doing well. HOME CARE INSTRUCTIONS  Medicines  Follow your health care provider's instructions regarding medicine use. Specific medicines may be either safe or unsafe to take during pregnancy.  Take your prenatal vitamins as directed.  If you develop constipation, try taking a stool softener if your health care provider approves. Diet  Eat regular, well-balanced meals. Choose a variety of foods, such as meat or vegetable-based protein, fish, milk and low-fat dairy products, vegetables, fruits, and whole grain breads and cereals. Your health care provider will help you determine the amount of weight gain that is right for you.  Avoid raw meat and uncooked cheese. These carry germs that can cause birth defects in the baby.  Eating four or five small meals rather than three large meals a day may help relieve nausea and vomiting. If you start to feel nauseous, eating a few soda crackers can be helpful. Drinking liquids between meals instead of during meals also seems to help nausea and vomiting.  If you develop constipation, eat more high-fiber foods, such as fresh vegetables or fruit and whole grains. Drink enough fluids to keep your urine clear or pale yellow. Activity and Exercise  Exercise only as directed by your health care provider. Exercising will help you:  Control your weight.  Stay in shape.  Be prepared for labor and delivery.  Experiencing pain or cramping in the lower abdomen or low back is a good sign that you should stop exercising. Check with your health care provider before continuing normal exercises.  Try to avoid standing for long periods of time. Move your legs often if you must stand in one place for a long time.  Avoid heavy lifting.  Wear low-heeled shoes, and practice good posture.  You may continue to have sex unless your health care provider directs you  otherwise. Relief of Pain or Discomfort  Wear a good support bra for breast tenderness.   Take warm sitz baths to soothe any pain or discomfort caused by hemorrhoids. Use hemorrhoid cream if your health care provider approves.   Rest with your legs elevated if you have leg cramps or low back pain.  If you develop varicose veins in your legs, wear support hose. Elevate your feet for 15 minutes, 3-4 times a day. Limit salt in your diet. Prenatal Care  Schedule your prenatal visits by the twelfth week of pregnancy. They are usually scheduled monthly at first, then more often in the last 2 months before delivery.  Write down your questions. Take them to your prenatal visits.  Keep all your prenatal visits as directed by your health care provider. Safety  Wear your seat belt at all times when driving.  Make a list of emergency phone numbers, including numbers for family, friends, the hospital, and police and fire departments. General Tips    Ask your health care provider for a referral to a local prenatal education class. Begin classes no later than at the beginning of month 6 of your pregnancy.  Ask for help if you have counseling or nutritional needs during pregnancy. Your health care provider can offer advice or refer you to specialists for help with various needs.  Do not use hot tubs, steam rooms, or saunas.  Do not douche or use tampons or scented sanitary pads.  Do not cross your legs for long periods of time.  Avoid cat litter boxes and soil used by cats. These carry germs that can cause birth defects in the baby and possibly loss of the fetus by miscarriage or stillbirth.  Avoid all smoking, herbs, alcohol, and medicines not prescribed by your health care provider. Chemicals in these affect the formation and growth of the baby.  Schedule a dentist appointment. At home, brush your teeth with a soft toothbrush and be gentle when you floss. SEEK MEDICAL CARE IF:   You have  dizziness.  You have mild pelvic cramps, pelvic pressure, or nagging pain in the abdominal area.  You have persistent nausea, vomiting, or diarrhea.  You have a bad smelling vaginal discharge.  You have pain with urination.  You notice increased swelling in your face, hands, legs, or ankles. SEEK IMMEDIATE MEDICAL CARE IF:   You have a fever.  You are leaking fluid from your vagina.  You have spotting or bleeding from your vagina.  You have severe abdominal cramping or pain.  You have rapid weight gain or loss.  You vomit blood or material that looks like coffee grounds.  You are exposed to German measles and have never had them.  You are exposed to fifth disease or chickenpox.  You develop a severe headache.  You have shortness of breath.  You have any kind of trauma, such as from a fall or a car accident. Document Released: 07/19/2001 Document Revised: 12/09/2013 Document Reviewed: 06/04/2013 [employer] Patient Information 2015 [employer]. This information is not intended to replace advice given to you by your health care provider. Make sure you discuss any questions you have with your health care provider.  Contraception Choices Contraception (birth control) is the use of any methods or devices to prevent pregnancy. Below are some methods to help avoid pregnancy. HORMONAL METHODS   Contraceptive implant. This is a thin, plastic tube containing progesterone hormone. It does not contain estrogen hormone. Your health care provider inserts the tube in the inner part of the upper arm. The tube can remain in place for up to 3 years. After 3 years, the implant must be removed. The implant prevents the ovaries from releasing an egg (ovulation), thickens the cervical mucus to prevent sperm from entering the uterus, and thins the lining of the inside of the uterus.  Progesterone-only injections. These injections are given every 3 months by your health care provider to prevent  pregnancy. This synthetic progesterone hormone stops the ovaries from releasing eggs. It also thickens cervical mucus and changes the uterine lining. This makes it harder for sperm to survive in the uterus.  Birth control pills. These pills contain estrogen and progesterone hormone. They work by preventing the ovaries from releasing eggs (ovulation). They also cause the cervical mucus to thicken, preventing the sperm from entering the uterus. Birth control pills are prescribed by a health care provider.Birth control pills can also be used to treat heavy periods.  Minipill. This type of birth control pill contains   only the progesterone hormone. They are taken every day of each month and must be prescribed by your health care provider.  Birth control patch. The patch contains hormones similar to those in birth control pills. It must be changed once a week and is prescribed by a health care provider.  Vaginal ring. The ring contains hormones similar to those in birth control pills. It is left in the vagina for 3 weeks, removed for 1 week, and then a new one is put back in place. The patient must be comfortable inserting and removing the ring from the vagina.A health care provider's prescription is necessary.  Emergency contraception. Emergency contraceptives prevent pregnancy after unprotected sexual intercourse. This pill can be taken right after sex or up to 5 days after unprotected sex. It is most effective the sooner you take the pills after having sexual intercourse. Most emergency contraceptive pills are available without a prescription. Check with your pharmacist. Do not use emergency contraception as your only form of birth control. BARRIER METHODS   Female condom. This is a thin sheath (latex or rubber) that is worn over the penis during sexual intercourse. It can be used with spermicide to increase effectiveness.  Female condom. This is a soft, loose-fitting sheath that is put into the vagina  before sexual intercourse.  Diaphragm. This is a soft, latex, dome-shaped barrier that must be fitted by a health care provider. It is inserted into the vagina, along with a spermicidal jelly. It is inserted before intercourse. The diaphragm should be left in the vagina for 6 to 8 hours after intercourse.  Cervical cap. This is a round, soft, latex or plastic cup that fits over the cervix and must be fitted by a health care provider. The cap can be left in place for up to 48 hours after intercourse.  Sponge. This is a soft, circular piece of polyurethane foam. The sponge has spermicide in it. It is inserted into the vagina after wetting it and before sexual intercourse.  Spermicides. These are chemicals that kill or block sperm from entering the cervix and uterus. They come in the form of creams, jellies, suppositories, foam, or tablets. They do not require a prescription. They are inserted into the vagina with an applicator before having sexual intercourse. The process must be repeated every time you have sexual intercourse. INTRAUTERINE CONTRACEPTION  Intrauterine device (IUD). This is a T-shaped device that is put in a woman's uterus during a menstrual period to prevent pregnancy. There are 2 types:  Copper IUD. This type of IUD is wrapped in copper wire and is placed inside the uterus. Copper makes the uterus and fallopian tubes produce a fluid that kills sperm. It can stay in place for 10 years.  Hormone IUD. This type of IUD contains the hormone progestin (synthetic progesterone). The hormone thickens the cervical mucus and prevents sperm from entering the uterus, and it also thins the uterine lining to prevent implantation of a fertilized egg. The hormone can weaken or kill the sperm that get into the uterus. It can stay in place for 3-5 years, depending on which type of IUD is used. PERMANENT METHODS OF CONTRACEPTION  Female tubal ligation. This is when the woman's fallopian tubes are  surgically sealed, tied, or blocked to prevent the egg from traveling to the uterus.  Hysteroscopic sterilization. This involves placing a small coil or insert into each fallopian tube. Your doctor uses a technique called hysteroscopy to do the procedure. The device causes scar tissue   to form. This results in permanent blockage of the fallopian tubes, so the sperm cannot fertilize the egg. It takes about 3 months after the procedure for the tubes to become blocked. You must use another form of birth control for these 3 months.  Female sterilization. This is when the female has the tubes that carry sperm tied off (vasectomy).This blocks sperm from entering the vagina during sexual intercourse. After the procedure, the man can still ejaculate fluid (semen). NATURAL PLANNING METHODS  Natural family planning. This is not having sexual intercourse or using a barrier method (condom, diaphragm, cervical cap) on days the woman could become pregnant.  Calendar method. This is keeping track of the length of each menstrual cycle and identifying when you are fertile.  Ovulation method. This is avoiding sexual intercourse during ovulation.  Symptothermal method. This is avoiding sexual intercourse during ovulation, using a thermometer and ovulation symptoms.  Post-ovulation method. This is timing sexual intercourse after you have ovulated. Regardless of which type or method of contraception you choose, it is important that you use condoms to protect against the transmission of sexually transmitted infections (STIs). Talk with your health care provider about which form of contraception is most appropriate for you. Document Released: 07/25/2005 Document Revised: 07/30/2013 Document Reviewed: 01/17/2013 [employer] Patient Information 2015 [employer]. This information is not intended to replace advice given to you by your health care provider. Make sure you discuss any questions you have with your health care  provider.  Breastfeeding Deciding to breastfeed is one of the best choices you can make for you and your baby. A change in hormones during pregnancy causes your breast tissue to grow and increases the number and size of your milk ducts. These hormones also allow proteins, sugars, and fats from your blood supply to make breast milk in your milk-producing glands. Hormones prevent breast milk from being released before your baby is born as well as prompt milk flow after birth. Once breastfeeding has begun, thoughts of your baby, as well as his or her sucking or crying, can stimulate the release of milk from your milk-producing glands.  BENEFITS OF BREASTFEEDING For Your Baby  Your first milk (colostrum) helps your baby's digestive system function better.   There are antibodies in your milk that help your baby fight off infections.   Your baby has a lower incidence of asthma, allergies, and sudden infant death syndrome.   The nutrients in breast milk are better for your baby than infant formulas and are designed uniquely for your baby's needs.   Breast milk improves your baby's brain development.   Your baby is less likely to develop other conditions, such as childhood obesity, asthma, or type 2 diabetes mellitus.  For You   Breastfeeding helps to create a very special bond between you and your baby.   Breastfeeding is convenient. Breast milk is always available at the correct temperature and costs nothing.   Breastfeeding helps to burn calories and helps you lose the weight gained during pregnancy.   Breastfeeding makes your uterus contract to its prepregnancy size faster and slows bleeding (lochia) after you give birth.   Breastfeeding helps to lower your risk of developing type 2 diabetes mellitus, osteoporosis, and breast or ovarian cancer later in life. SIGNS THAT YOUR BABY IS HUNGRY Early Signs of Hunger  Increased alertness or activity.  Stretching.  Movement of the  head from side to side.  Movement of the head and opening of the mouth when the corner   of the mouth or cheek is stroked (rooting).  Increased sucking sounds, smacking lips, cooing, sighing, or squeaking.  Hand-to-mouth movements.  Increased sucking of fingers or hands. Late Signs of Hunger  Fussing.  Intermittent crying. Extreme Signs of Hunger Signs of extreme hunger will require calming and consoling before your baby will be able to breastfeed successfully. Do not wait for the following signs of extreme hunger to occur before you initiate breastfeeding:   Restlessness.  A loud, strong cry.   Screaming. BREASTFEEDING BASICS Breastfeeding Initiation  Find a comfortable place to sit or lie down, with your neck and back well supported.  Place a pillow or rolled up blanket under your baby to bring him or her to the level of your breast (if you are seated). Nursing pillows are specially designed to help support your arms and your baby while you breastfeed.  Make sure that your baby's abdomen is facing your abdomen.   Gently massage your breast. With your fingertips, massage from your chest wall toward your nipple in a circular motion. This encourages milk flow. You may need to continue this action during the feeding if your milk flows slowly.  Support your breast with 4 fingers underneath and your thumb above your nipple. Make sure your fingers are well away from your nipple and your baby's mouth.   Stroke your baby's lips gently with your finger or nipple.   When your baby's mouth is open wide enough, quickly bring your baby to your breast, placing your entire nipple and as much of the colored area around your nipple (areola) as possible into your baby's mouth.   More areola should be visible above your baby's upper lip than below the lower lip.   Your baby's tongue should be between his or her lower gum and your breast.   Ensure that your baby's mouth is correctly  positioned around your nipple (latched). Your baby's lips should create a seal on your breast and be turned out (everted).  It is common for your baby to suck about 2-3 minutes in order to start the flow of breast milk. Latching Teaching your baby how to latch on to your breast properly is very important. An improper latch can cause nipple pain and decreased milk supply for you and poor weight gain in your baby. Also, if your baby is not latched onto your nipple properly, he or she may swallow some air during feeding. This can make your baby fussy. Burping your baby when you switch breasts during the feeding can help to get rid of the air. However, teaching your baby to latch on properly is still the best way to prevent fussiness from swallowing air while breastfeeding. Signs that your baby has successfully latched on to your nipple:    Silent tugging or silent sucking, without causing you pain.   Swallowing heard between every 3-4 sucks.    Muscle movement above and in front of his or her ears while sucking.  Signs that your baby has not successfully latched on to nipple:   Sucking sounds or smacking sounds from your baby while breastfeeding.  Nipple pain. If you think your baby has not latched on correctly, slip your finger into the corner of your baby's mouth to break the suction and place it between your baby's gums. Attempt breastfeeding initiation again. Signs of Successful Breastfeeding Signs from your baby:   A gradual decrease in the number of sucks or complete cessation of sucking.   Falling asleep.     Relaxation of his or her body.   Retention of a small amount of milk in his or her mouth.   Letting go of your breast by himself or herself. Signs from you:  Breasts that have increased in firmness, weight, and size 1-3 hours after feeding.   Breasts that are softer immediately after breastfeeding.  Increased milk volume, as well as a change in milk consistency  and color by the fifth day of breastfeeding.   Nipples that are not sore, cracked, or bleeding. Signs That Your Baby is Getting Enough Milk  Wetting at least 3 diapers in a 24-hour period. The urine should be clear and pale yellow by age 5 days.  At least 3 stools in a 24-hour period by age 5 days. The stool should be soft and yellow.  At least 3 stools in a 24-hour period by age 7 days. The stool should be seedy and yellow.  No loss of weight greater than 10% of birth weight during the first 3 days of age.  Average weight gain of 4-7 ounces (113-198 g) per week after age 4 days.  Consistent daily weight gain by age 5 days, without weight loss after the age of 2 weeks. After a feeding, your baby may spit up a small amount. This is common. BREASTFEEDING FREQUENCY AND DURATION Frequent feeding will help you make more milk and can prevent sore nipples and breast engorgement. Breastfeed when you feel the need to reduce the fullness of your breasts or when your baby shows signs of hunger. This is called "breastfeeding on demand." Avoid introducing a pacifier to your baby while you are working to establish breastfeeding (the first 4-6 weeks after your baby is born). After this time you may choose to use a pacifier. Research has shown that pacifier use during the first year of a baby's life decreases the risk of sudden infant death syndrome (SIDS). Allow your baby to feed on each breast as long as he or she wants. Breastfeed until your baby is finished feeding. When your baby unlatches or falls asleep while feeding from the first breast, offer the second breast. Because newborns are often sleepy in the first few weeks of life, you may need to awaken your baby to get him or her to feed. Breastfeeding times will vary from baby to baby. However, the following rules can serve as a guide to help you ensure that your baby is properly fed:  Newborns (babies 4 weeks of age or younger) may breastfeed every  1-3 hours.  Newborns should not go longer than 3 hours during the day or 5 hours during the night without breastfeeding.  You should breastfeed your baby a minimum of 8 times in a 24-hour period until you begin to introduce solid foods to your baby at around 6 months of age. BREAST MILK PUMPING Pumping and storing breast milk allows you to ensure that your baby is exclusively fed your breast milk, even at times when you are unable to breastfeed. This is especially important if you are going back to work while you are still breastfeeding or when you are not able to be present during feedings. Your lactation consultant can give you guidelines on how long it is safe to store breast milk.  A breast pump is a machine that allows you to pump milk from your breast into a sterile bottle. The pumped breast milk can then be stored in a refrigerator or freezer. Some breast pumps are operated by hand, while others   use electricity. Ask your lactation consultant which type will work best for you. Breast pumps can be purchased, but some hospitals and breastfeeding support groups lease breast pumps on a monthly basis. A lactation consultant can teach you how to hand express breast milk, if you prefer not to use a pump.  CARING FOR YOUR BREASTS WHILE YOU BREASTFEED Nipples can become dry, cracked, and sore while breastfeeding. The following recommendations can help keep your breasts moisturized and healthy:  Avoid using soap on your nipples.   Wear a supportive bra. Although not required, special nursing bras and tank tops are designed to allow access to your breasts for breastfeeding without taking off your entire bra or top. Avoid wearing underwire-style bras or extremely tight bras.  Air dry your nipples for 3-4minutes after each feeding.   Use only cotton bra pads to absorb leaked breast milk. Leaking of breast milk between feedings is normal.   Use lanolin on your nipples after breastfeeding. Lanolin  helps to maintain your skin's normal moisture barrier. If you use pure lanolin, you do not need to wash it off before feeding your baby again. Pure lanolin is not toxic to your baby. You may also hand express a few drops of breast milk and gently massage that milk into your nipples and allow the milk to air dry. In the first few weeks after giving birth, some women experience extremely full breasts (engorgement). Engorgement can make your breasts feel heavy, warm, and tender to the touch. Engorgement peaks within 3-5 days after you give birth. The following recommendations can help ease engorgement:  Completely empty your breasts while breastfeeding or pumping. You may want to start by applying warm, moist heat (in the shower or with warm water-soaked hand towels) just before feeding or pumping. This increases circulation and helps the milk flow. If your baby does not completely empty your breasts while breastfeeding, pump any extra milk after he or she is finished.  Wear a snug bra (nursing or regular) or tank top for 1-2 days to signal your body to slightly decrease milk production.  Apply ice packs to your breasts, unless this is too uncomfortable for you.  Make sure that your baby is latched on and positioned properly while breastfeeding. If engorgement persists after 48 hours of following these recommendations, contact your health care provider or a lactation consultant. OVERALL HEALTH CARE RECOMMENDATIONS WHILE BREASTFEEDING  Eat healthy foods. Alternate between meals and snacks, eating 3 of each per day. Because what you eat affects your breast milk, some of the foods may make your baby more irritable than usual. Avoid eating these foods if you are sure that they are negatively affecting your baby.  Drink milk, fruit juice, and water to satisfy your thirst (about 10 glasses a day).   Rest often, relax, and continue to take your prenatal vitamins to prevent fatigue, stress, and  anemia.  Continue breast self-awareness checks.  Avoid chewing and smoking tobacco.  Avoid alcohol and drug use. Some medicines that may be harmful to your baby can pass through breast milk. It is important to ask your health care provider before taking any medicine, including all over-the-counter and prescription medicine as well as vitamin and herbal supplements. It is possible to become pregnant while breastfeeding. If birth control is desired, ask your health care provider about options that will be safe for your baby. SEEK MEDICAL CARE IF:   You feel like you want to stop breastfeeding or have become frustrated with breastfeeding.    You have painful breasts or nipples.  Your nipples are cracked or bleeding.  Your breasts are red, tender, or warm.  You have a swollen area on either breast.  You have a fever or chills.  You have nausea or vomiting.  You have drainage other than breast milk from your nipples.  Your breasts do not become full before feedings by the fifth day after you give birth.  You feel sad and depressed.  Your baby is too sleepy to eat well.  Your baby is having trouble sleeping.   Your baby is wetting less than 3 diapers in a 24-hour period.  Your baby has less than 3 stools in a 24-hour period.  Your baby's skin or the white part of his or her eyes becomes yellow.   Your baby is not gaining weight by 5 days of age. SEEK IMMEDIATE MEDICAL CARE IF:   Your baby is overly tired (lethargic) and does not want to wake up and feed.  Your baby develops an unexplained fever. Document Released: 07/25/2005 Document Revised: 07/30/2013 Document Reviewed: 01/16/2013 [employer] Patient Information 2015 [employer]. This information is not intended to replace advice given to you by your health care provider. Make sure you discuss any questions you have with your health care provider.  

## 2014-07-28 NOTE — Addendum Note (Signed)
Addended by: Catalina AntiguaONSTANT, Kyrus Hyde on: 07/28/2014 04:09 PM   Modules accepted: Orders

## 2014-07-28 NOTE — Progress Notes (Signed)
   Subjective:    Chelsea Ferguson is a G1P0 7048w2d being seen today for her first obstetrical visit.  Her obstetrical history is significant for obesity and pregnancy conceived via IVF. Patient does intend to breast feed. Pregnancy history fully reviewed.  Patient reports no complaints.  Filed Vitals:   07/28/14 1539  BP: 120/74  Pulse: 88  Weight: 181 lb (82.101 kg)    HISTORY: OB History  Gravida Para Term Preterm AB SAB TAB Ectopic Multiple Living  1             # Outcome Date GA Lbr Len/2nd Weight Sex Delivery Anes PTL Lv  1 Current              Past Medical History  Diagnosis Date  . IBS (irritable bowel syndrome)   . Infertility, female   . PCOS (polycystic ovarian syndrome)    No past surgical history on file. No family history on file.   Exam    Uterus:     Pelvic Exam:    Perineum: No Hemorrhoids, Normal Perineum   Vulva: normal   Vagina:  normal mucosa, normal discharge   pH:    Cervix: closed and long   Adnexa: normal adnexa and no mass, fullness, tenderness   Bony Pelvis: gynecoid  System: Breast:  normal appearance, no masses or tenderness   Skin: normal coloration and turgor, no rashes    Neurologic: oriented, no focal deficits   Extremities: normal strength, tone, and muscle mass   HEENT extra ocular movement intact   Mouth/Teeth mucous membranes moist, pharynx normal without lesions and dental hygiene good   Neck supple and no masses   Cardiovascular: regular rate and rhythm   Respiratory:  chest clear, no wheezing, crepitations, rhonchi, normal symmetric air entry   Abdomen: soft, non-tender; bowel sounds normal; no masses,  no organomegaly   Urinary:       Assessment:    Pregnancy: G1P0 Patient Active Problem List   Diagnosis Date Noted  . Supervision of normal first pregnancy 07/28/2014  . Obesity (BMI 30.0-34.9) 07/28/2014  . Obesity affecting pregnancy, antepartum 07/28/2014  . AMENORRHEA 11/14/2007        Plan:      Initial labs drawn. Prenatal vitamins. Problem list reviewed and updated. Genetic Screening discussed First Screen: undecided.  Ultrasound discussed; fetal survey: requested.  Follow up in 4 weeks. 50% of 30 min visit spent on counseling and coordination of care.     Chelsea Ferguson 07/28/2014

## 2014-07-29 LAB — PRESCRIPTION MONITORING PROFILE (19 PANEL)
AMPHETAMINE/METH: NEGATIVE ng/mL
BARBITURATE SCREEN, URINE: NEGATIVE ng/mL
BENZODIAZEPINE SCREEN, URINE: NEGATIVE ng/mL
BUPRENORPHINE, URINE: NEGATIVE ng/mL
Cannabinoid Scrn, Ur: NEGATIVE ng/mL
Carisoprodol, Urine: NEGATIVE ng/mL
Cocaine Metabolites: NEGATIVE ng/mL
Creatinine, Urine: 89.1 mg/dL (ref >20.0)
Fentanyl, Ur: NEGATIVE ng/mL
MDMA URINE: NEGATIVE ng/mL
METHAQUALONE SCREEN (URINE): NEGATIVE ng/mL
Meperidine, Ur: NEGATIVE ng/mL
Methadone Screen, Urine: NEGATIVE ng/mL
NITRITES URINE, INITIAL: NEGATIVE ug/mL
Opiate Screen, Urine: NEGATIVE ng/mL
Oxycodone Screen, Ur: NEGATIVE ng/mL
Phencyclidine, Ur: NEGATIVE ng/mL
Propoxyphene: NEGATIVE ng/mL
TAPENTADOLUR: NEGATIVE ng/mL
Tramadol Scrn, Ur: NEGATIVE ng/mL
ZOLPIDEM, URINE: NEGATIVE ng/mL
pH, Initial: 5.5 pH (ref 4.5–8.9)

## 2014-07-29 LAB — OBSTETRIC PANEL
Antibody Screen: NEGATIVE
Basophils Absolute: 0.2 10*3/uL — ABNORMAL HIGH (ref 0.0–0.1)
Basophils Relative: 1 % (ref 0–1)
Eosinophils Absolute: 3.5 10*3/uL — ABNORMAL HIGH (ref 0.0–0.7)
Eosinophils Relative: 23 % — ABNORMAL HIGH (ref 0–5)
HCT: 35.5 % — ABNORMAL LOW (ref 36.0–46.0)
HEMOGLOBIN: 11.9 g/dL — AB (ref 12.0–15.0)
Hepatitis B Surface Ag: NEGATIVE
LYMPHS PCT: 23 % (ref 12–46)
Lymphs Abs: 3.5 10*3/uL (ref 0.7–4.0)
MCH: 27.8 pg (ref 26.0–34.0)
MCHC: 33.5 g/dL (ref 30.0–36.0)
MCV: 82.9 fL (ref 78.0–100.0)
MONO ABS: 0.8 10*3/uL (ref 0.1–1.0)
MPV: 10.5 fL (ref 9.4–12.4)
Monocytes Relative: 5 % (ref 3–12)
Neutro Abs: 7.3 10*3/uL (ref 1.7–7.7)
Neutrophils Relative %: 48 % (ref 43–77)
PLATELETS: 341 10*3/uL (ref 150–400)
RBC: 4.28 MIL/uL (ref 3.87–5.11)
RDW: 14.1 % (ref 11.5–15.5)
RUBELLA: 1.02 {index} — AB (ref <0.90)
Rh Type: POSITIVE
WBC: 15.3 10*3/uL — ABNORMAL HIGH (ref 4.0–10.5)

## 2014-07-29 LAB — CULTURE, OB URINE: Colony Count: 30000

## 2014-07-29 LAB — HIV ANTIBODY (ROUTINE TESTING W REFLEX): HIV: NONREACTIVE

## 2014-08-08 NOTE — L&D Delivery Note (Addendum)
Operative Delivery Note At 7:34 PM a viable female was delivered via Vaginal, Vacuum Investment banker, operational(Extractor).  Presentation: vertex; Position: Right,, Occiput,, Anterior; Station: +3.  Verbal consent: obtained from patient.  Risks and benefits discussed in detail.  Risks include, but are not limited to the risks of anesthesia, bleeding, infection, damage to maternal tissues, fetal cephalhematoma.  There is also the risk of inability to effect vaginal delivery of the head, or shoulder dystocia that cannot be resolved by established maneuvers, leading to the need for emergency cesarean section.  APGAR: 3, 8; weight 5 lb 3.6 oz (2370 g).   Placenta status: Intact, Spontaneous.   Cord: 3 vessels with the following complications: none.    VAVD done for maternal exhaustion.  Kiwi Vacuum extractor placed at +3 station 3cm posterior from anterior fontenelle. At the onset of the next contraction, the Vacuum was increased to 500mm Hg. One pull was done along the axis of the pelvis and the head was delivered.  The vacuum was discontinued, due to mild shoulder dystocia, the patient was laid back and McRoberts was done and the remainder of the infant was delivered.  The baby was delivered to mother's abdomen.  Primary resuscitation started with stimulation.  With low Apgar, cord clamped and cut and handed off to warmer.  Cord blood gas obtained (arterial).  Due to extensive bleeding, cytotec 1000mcg PR given, along with Hemabate IM.  Second line initiated and code hemorrhage called.  Second dose of Hemabate given.  Uterine tone improved and bleeding slowed.  The patient continued to have some mild to moderate flow.  Bakari balloon placement was trialed, but was unsuccessful despite multiple attempts.  Ultrasound was used in attempt for guidance and it was noticed that her bladder was distended.  The foley catheter was adjusted and drained the bladder.  Bleeding improved after that with continued uterine tone.    UA cord pH:  7.29  An hour of direct critical care time was preformed, including life saving maneuvers to prevent hemorrhagic shock.  Anesthesia: Epidural  Instruments: none Episiotomy: None Lacerations:  1st degree Suture Repair: 3.0 vicryl rapide Est. Blood Loss (mL):  1800 mL  Mom to postpartum.  Baby to Couplet care / Skin to Skin.   Chelsea Ferguson JEHIEL 02/10/2015, 8:41 PM

## 2014-08-25 ENCOUNTER — Ambulatory Visit (INDEPENDENT_AMBULATORY_CARE_PROVIDER_SITE_OTHER): Payer: Medicaid Other | Admitting: Family Medicine

## 2014-08-25 ENCOUNTER — Encounter: Payer: Self-pay | Admitting: Family Medicine

## 2014-08-25 VITALS — BP 118/80 | HR 77 | Wt 178.0 lb

## 2014-08-25 DIAGNOSIS — Z3401 Encounter for supervision of normal first pregnancy, first trimester: Secondary | ICD-10-CM

## 2014-08-25 NOTE — Progress Notes (Signed)
New OB labs reviewed and look good Having occasional headaches. Limited appetite--should improve with change in trimester. For first screen next week.

## 2014-08-25 NOTE — Patient Instructions (Signed)
Second Trimester of Pregnancy The second trimester is from week 13 through week 28, months 4 through 6. The second trimester is often a time when you feel your best. Your body has also adjusted to being pregnant, and you begin to feel better physically. Usually, morning sickness has lessened or quit completely, you may have more energy, and you may have an increase in appetite. The second trimester is also a time when the fetus is growing rapidly. At the end of the sixth month, the fetus is about 9 inches long and weighs about 1 pounds. You will likely begin to feel the baby move (quickening) between 18 and 20 weeks of the pregnancy. BODY CHANGES Your body goes through many changes during pregnancy. The changes vary from woman to woman.   Your weight will continue to increase. You will notice your lower abdomen bulging out.  You may begin to get stretch marks on your hips, abdomen, and breasts.  You may develop headaches that can be relieved by medicines approved by your health care provider.  You may urinate more often because the fetus is pressing on your bladder.  You may develop or continue to have heartburn as a result of your pregnancy.  You may develop constipation because certain hormones are causing the muscles that push waste through your intestines to slow down.  You may develop hemorrhoids or swollen, bulging veins (varicose veins).  You may have back pain because of the weight gain and pregnancy hormones relaxing your joints between the bones in your pelvis and as a result of a shift in weight and the muscles that support your balance.  Your breasts will continue to grow and be tender.  Your gums may bleed and may be sensitive to brushing and flossing.  Dark spots or blotches (chloasma, mask of pregnancy) may develop on your face. This will likely fade after the baby is born.  A dark line from your belly button to the pubic area (linea nigra) may appear. This will likely  fade after the baby is born.  You may have changes in your hair. These can include thickening of your hair, rapid growth, and changes in texture. Some women also have hair loss during or after pregnancy, or hair that feels dry or thin. Your hair will most likely return to normal after your baby is born. WHAT TO EXPECT AT YOUR PRENATAL VISITS During a routine prenatal visit:  You will be weighed to make sure you and the fetus are growing normally.  Your blood pressure will be taken.  Your abdomen will be measured to track your baby's growth.  The fetal heartbeat will be listened to.  Any test results from the previous visit will be discussed. Your health care provider may ask you:  How you are feeling.  If you are feeling the baby move.  If you have had any abnormal symptoms, such as leaking fluid, bleeding, severe headaches, or abdominal cramping.  If you have any questions. Other tests that may be performed during your second trimester include:  Blood tests that check for:  Low iron levels (anemia).  Gestational diabetes (between 24 and 28 weeks).  Rh antibodies.  Urine tests to check for infections, diabetes, or protein in the urine.  An ultrasound to confirm the proper growth and development of the baby.  An amniocentesis to check for possible genetic problems.  Fetal screens for spina bifida and Down syndrome. HOME CARE INSTRUCTIONS   Avoid all smoking, herbs, alcohol, and unprescribed   drugs. These chemicals affect the formation and growth of the baby.  Follow your health care provider's instructions regarding medicine use. There are medicines that are either safe or unsafe to take during pregnancy.  Exercise only as directed by your health care provider. Experiencing uterine cramps is a good sign to stop exercising.  Continue to eat regular, healthy meals.  Wear a good support bra for breast tenderness.  Do not use hot tubs, steam rooms, or saunas.  Wear  your seat belt at all times when driving.  Avoid raw meat, uncooked cheese, cat litter boxes, and soil used by cats. These carry germs that can cause birth defects in the baby.  Take your prenatal vitamins.  Try taking a stool softener (if your health care provider approves) if you develop constipation. Eat more high-fiber foods, such as fresh vegetables or fruit and whole grains. Drink plenty of fluids to keep your urine clear or pale yellow.  Take warm sitz baths to soothe any pain or discomfort caused by hemorrhoids. Use hemorrhoid cream if your health care provider approves.  If you develop varicose veins, wear support hose. Elevate your feet for 15 minutes, 3-4 times a day. Limit salt in your diet.  Avoid heavy lifting, wear low heel shoes, and practice good posture.  Rest with your legs elevated if you have leg cramps or low back pain.  Visit your dentist if you have not gone yet during your pregnancy. Use a soft toothbrush to brush your teeth and be gentle when you floss.  A sexual relationship may be continued unless your health care provider directs you otherwise.  Continue to go to all your prenatal visits as directed by your health care provider. SEEK MEDICAL CARE IF:   You have dizziness.  You have mild pelvic cramps, pelvic pressure, or nagging pain in the abdominal area.  You have persistent nausea, vomiting, or diarrhea.  You have a bad smelling vaginal discharge.  You have pain with urination. SEEK IMMEDIATE MEDICAL CARE IF:   You have a fever.  You are leaking fluid from your vagina.  You have spotting or bleeding from your vagina.  You have severe abdominal cramping or pain.  You have rapid weight gain or loss.  You have shortness of breath with chest pain.  You notice sudden or extreme swelling of your face, hands, ankles, feet, or legs.  You have not felt your baby move in over an hour.  You have severe headaches that do not go away with  medicine.  You have vision changes. Document Released: 07/19/2001 Document Revised: 07/30/2013 Document Reviewed: 09/25/2012 [employer] Patient Information 2015 [employer]. This information is not intended to replace advice given to you by your health care provider. Make sure you discuss any questions you have with your health care provider.  Breastfeeding Deciding to breastfeed is one of the best choices you can make for you and your baby. A change in hormones during pregnancy causes your breast tissue to grow and increases the number and size of your milk ducts. These hormones also allow proteins, sugars, and fats from your blood supply to make breast milk in your milk-producing glands. Hormones prevent breast milk from being released before your baby is born as well as prompt milk flow after birth. Once breastfeeding has begun, thoughts of your baby, as well as his or her sucking or crying, can stimulate the release of milk from your milk-producing glands.  BENEFITS OF BREASTFEEDING For Your Baby  Your first   milk (colostrum) helps your baby's digestive system function better.   There are antibodies in your milk that help your baby fight off infections.   Your baby has a lower incidence of asthma, allergies, and sudden infant death syndrome.   The nutrients in breast milk are better for your baby than infant formulas and are designed uniquely for your baby's needs.   Breast milk improves your baby's brain development.   Your baby is less likely to develop other conditions, such as childhood obesity, asthma, or type 2 diabetes mellitus.  For You   Breastfeeding helps to create a very special bond between you and your baby.   Breastfeeding is convenient. Breast milk is always available at the correct temperature and costs nothing.   Breastfeeding helps to burn calories and helps you lose the weight gained during pregnancy.   Breastfeeding makes your uterus contract to its  prepregnancy size faster and slows bleeding (lochia) after you give birth.   Breastfeeding helps to lower your risk of developing type 2 diabetes mellitus, osteoporosis, and breast or ovarian cancer later in life. SIGNS THAT YOUR BABY IS HUNGRY Early Signs of Hunger  Increased alertness or activity.  Stretching.  Movement of the head from side to side.  Movement of the head and opening of the mouth when the corner of the mouth or cheek is stroked (rooting).  Increased sucking sounds, smacking lips, cooing, sighing, or squeaking.  Hand-to-mouth movements.  Increased sucking of fingers or hands. Late Signs of Hunger  Fussing.  Intermittent crying. Extreme Signs of Hunger Signs of extreme hunger will require calming and consoling before your baby will be able to breastfeed successfully. Do not wait for the following signs of extreme hunger to occur before you initiate breastfeeding:   Restlessness.  A loud, strong cry.   Screaming. BREASTFEEDING BASICS Breastfeeding Initiation  Find a comfortable place to sit or lie down, with your neck and back well supported.  Place a pillow or rolled up blanket under your baby to bring him or her to the level of your breast (if you are seated). Nursing pillows are specially designed to help support your arms and your baby while you breastfeed.  Make sure that your baby's abdomen is facing your abdomen.   Gently massage your breast. With your fingertips, massage from your chest wall toward your nipple in a circular motion. This encourages milk flow. You may need to continue this action during the feeding if your milk flows slowly.  Support your breast with 4 fingers underneath and your thumb above your nipple. Make sure your fingers are well away from your nipple and your baby's mouth.   Stroke your baby's lips gently with your finger or nipple.   When your baby's mouth is open wide enough, quickly bring your baby to your breast,  placing your entire nipple and as much of the colored area around your nipple (areola) as possible into your baby's mouth.   More areola should be visible above your baby's upper lip than below the lower lip.   Your baby's tongue should be between his or her lower gum and your breast.   Ensure that your baby's mouth is correctly positioned around your nipple (latched). Your baby's lips should create a seal on your breast and be turned out (everted).  It is common for your baby to suck about 2-3 minutes in order to start the flow of breast milk. Latching Teaching your baby how to latch on to your breast   properly is very important. An improper latch can cause nipple pain and decreased milk supply for you and poor weight gain in your baby. Also, if your baby is not latched onto your nipple properly, he or she may swallow some air during feeding. This can make your baby fussy. Burping your baby when you switch breasts during the feeding can help to get rid of the air. However, teaching your baby to latch on properly is still the best way to prevent fussiness from swallowing air while breastfeeding. Signs that your baby has successfully latched on to your nipple:    Silent tugging or silent sucking, without causing you pain.   Swallowing heard between every 3-4 sucks.    Muscle movement above and in front of his or her ears while sucking.  Signs that your baby has not successfully latched on to nipple:   Sucking sounds or smacking sounds from your baby while breastfeeding.  Nipple pain. If you think your baby has not latched on correctly, slip your finger into the corner of your baby's mouth to break the suction and place it between your baby's gums. Attempt breastfeeding initiation again. Signs of Successful Breastfeeding Signs from your baby:   A gradual decrease in the number of sucks or complete cessation of sucking.   Falling asleep.   Relaxation of his or her body.    Retention of a small amount of milk in his or her mouth.   Letting go of your breast by himself or herself. Signs from you:  Breasts that have increased in firmness, weight, and size 1-3 hours after feeding.   Breasts that are softer immediately after breastfeeding.  Increased milk volume, as well as a change in milk consistency and color by the fifth day of breastfeeding.   Nipples that are not sore, cracked, or bleeding. Signs That Your Baby is Getting Enough Milk  Wetting at least 3 diapers in a 24-hour period. The urine should be clear and pale yellow by age 5 days.  At least 3 stools in a 24-hour period by age 5 days. The stool should be soft and yellow.  At least 3 stools in a 24-hour period by age 7 days. The stool should be seedy and yellow.  No loss of weight greater than 10% of birth weight during the first 3 days of age.  Average weight gain of 4-7 ounces (113-198 g) per week after age 4 days.  Consistent daily weight gain by age 5 days, without weight loss after the age of 2 weeks. After a feeding, your baby may spit up a small amount. This is common. BREASTFEEDING FREQUENCY AND DURATION Frequent feeding will help you make more milk and can prevent sore nipples and breast engorgement. Breastfeed when you feel the need to reduce the fullness of your breasts or when your baby shows signs of hunger. This is called "breastfeeding on demand." Avoid introducing a pacifier to your baby while you are working to establish breastfeeding (the first 4-6 weeks after your baby is born). After this time you may choose to use a pacifier. Research has shown that pacifier use during the first year of a baby's life decreases the risk of sudden infant death syndrome (SIDS). Allow your baby to feed on each breast as long as he or she wants. Breastfeed until your baby is finished feeding. When your baby unlatches or falls asleep while feeding from the first breast, offer the second breast.  Because newborns are often sleepy in the   first few weeks of life, you may need to awaken your baby to get him or her to feed. Breastfeeding times will vary from baby to baby. However, the following rules can serve as a guide to help you ensure that your baby is properly fed:  Newborns (babies 4 weeks of age or younger) may breastfeed every 1-3 hours.  Newborns should not go longer than 3 hours during the day or 5 hours during the night without breastfeeding.  You should breastfeed your baby a minimum of 8 times in a 24-hour period until you begin to introduce solid foods to your baby at around 6 months of age. BREAST MILK PUMPING Pumping and storing breast milk allows you to ensure that your baby is exclusively fed your breast milk, even at times when you are unable to breastfeed. This is especially important if you are going back to work while you are still breastfeeding or when you are not able to be present during feedings. Your lactation consultant can give you guidelines on how long it is safe to store breast milk.  A breast pump is a machine that allows you to pump milk from your breast into a sterile bottle. The pumped breast milk can then be stored in a refrigerator or freezer. Some breast pumps are operated by hand, while others use electricity. Ask your lactation consultant which type will work best for you. Breast pumps can be purchased, but some hospitals and breastfeeding support groups lease breast pumps on a monthly basis. A lactation consultant can teach you how to hand express breast milk, if you prefer not to use a pump.  CARING FOR YOUR BREASTS WHILE YOU BREASTFEED Nipples can become dry, cracked, and sore while breastfeeding. The following recommendations can help keep your breasts moisturized and healthy:  Avoid using soap on your nipples.   Wear a supportive bra. Although not required, special nursing bras and tank tops are designed to allow access to your breasts for  breastfeeding without taking off your entire bra or top. Avoid wearing underwire-style bras or extremely tight bras.  Air dry your nipples for 3-4minutes after each feeding.   Use only cotton bra pads to absorb leaked breast milk. Leaking of breast milk between feedings is normal.   Use lanolin on your nipples after breastfeeding. Lanolin helps to maintain your skin's normal moisture barrier. If you use pure lanolin, you do not need to wash it off before feeding your baby again. Pure lanolin is not toxic to your baby. You may also hand express a few drops of breast milk and gently massage that milk into your nipples and allow the milk to air dry. In the first few weeks after giving birth, some women experience extremely full breasts (engorgement). Engorgement can make your breasts feel heavy, warm, and tender to the touch. Engorgement peaks within 3-5 days after you give birth. The following recommendations can help ease engorgement:  Completely empty your breasts while breastfeeding or pumping. You may want to start by applying warm, moist heat (in the shower or with warm water-soaked hand towels) just before feeding or pumping. This increases circulation and helps the milk flow. If your baby does not completely empty your breasts while breastfeeding, pump any extra milk after he or she is finished.  Wear a snug bra (nursing or regular) or tank top for 1-2 days to signal your body to slightly decrease milk production.  Apply ice packs to your breasts, unless this is too uncomfortable for you.    Make sure that your baby is latched on and positioned properly while breastfeeding. If engorgement persists after 48 hours of following these recommendations, contact your health care provider or a lactation consultant. OVERALL HEALTH CARE RECOMMENDATIONS WHILE BREASTFEEDING  Eat healthy foods. Alternate between meals and snacks, eating 3 of each per day. Because what you eat affects your breast milk,  some of the foods may make your baby more irritable than usual. Avoid eating these foods if you are sure that they are negatively affecting your baby.  Drink milk, fruit juice, and water to satisfy your thirst (about 10 glasses a day).   Rest often, relax, and continue to take your prenatal vitamins to prevent fatigue, stress, and anemia.  Continue breast self-awareness checks.  Avoid chewing and smoking tobacco.  Avoid alcohol and drug use. Some medicines that may be harmful to your baby can pass through breast milk. It is important to ask your health care provider before taking any medicine, including all over-the-counter and prescription medicine as well as vitamin and herbal supplements. It is possible to become pregnant while breastfeeding. If birth control is desired, ask your health care provider about options that will be safe for your baby. SEEK MEDICAL CARE IF:   You feel like you want to stop breastfeeding or have become frustrated with breastfeeding.  You have painful breasts or nipples.  Your nipples are cracked or bleeding.  Your breasts are red, tender, or warm.  You have a swollen area on either breast.  You have a fever or chills.  You have nausea or vomiting.  You have drainage other than breast milk from your nipples.  Your breasts do not become full before feedings by the fifth day after you give birth.  You feel sad and depressed.  Your baby is too sleepy to eat well.  Your baby is having trouble sleeping.   Your baby is wetting less than 3 diapers in a 24-hour period.  Your baby has less than 3 stools in a 24-hour period.  Your baby's skin or the white part of his or her eyes becomes yellow.   Your baby is not gaining weight by 5 days of age. SEEK IMMEDIATE MEDICAL CARE IF:   Your baby is overly tired (lethargic) and does not want to wake up and feed.  Your baby develops an unexplained fever. Document Released: 07/25/2005 Document Revised:  07/30/2013 Document Reviewed: 01/16/2013 [employer] Patient Information 2015 [employer]. This information is not intended to replace advice given to you by your health care provider. Make sure you discuss any questions you have with your health care provider.  

## 2014-08-29 ENCOUNTER — Other Ambulatory Visit (HOSPITAL_COMMUNITY): Payer: Self-pay

## 2014-09-03 ENCOUNTER — Ambulatory Visit (HOSPITAL_COMMUNITY)
Admission: RE | Admit: 2014-09-03 | Discharge: 2014-09-03 | Disposition: A | Discharge status: Home / Self Care | Payer: Medicaid Other | Source: Ambulatory Visit | Attending: Obstetrics and Gynecology | Admitting: Obstetrics and Gynecology

## 2014-09-03 ENCOUNTER — Encounter (HOSPITAL_COMMUNITY): Payer: Self-pay

## 2014-09-03 DIAGNOSIS — Z36 Encounter for antenatal screening of mother: Secondary | ICD-10-CM | POA: Diagnosis present

## 2014-09-03 DIAGNOSIS — Z3682 Encounter for antenatal screening for nuchal translucency: Secondary | ICD-10-CM | POA: Insufficient documentation

## 2014-09-03 DIAGNOSIS — O09811 Supervision of pregnancy resulting from assisted reproductive technology, first trimester: Secondary | ICD-10-CM | POA: Diagnosis not present

## 2014-09-03 DIAGNOSIS — Z3A12 12 weeks gestation of pregnancy: Secondary | ICD-10-CM | POA: Insufficient documentation

## 2014-09-09 ENCOUNTER — Other Ambulatory Visit (HOSPITAL_COMMUNITY): Payer: Self-pay

## 2014-09-22 ENCOUNTER — Ambulatory Visit (INDEPENDENT_AMBULATORY_CARE_PROVIDER_SITE_OTHER): Payer: Medicaid Other | Admitting: Obstetrics and Gynecology

## 2014-09-22 ENCOUNTER — Encounter: Payer: Self-pay | Admitting: Obstetrics and Gynecology

## 2014-09-22 VITALS — BP 134/77 | HR 89 | Wt 177.0 lb

## 2014-09-22 DIAGNOSIS — Z3402 Encounter for supervision of normal first pregnancy, second trimester: Secondary | ICD-10-CM

## 2014-09-22 DIAGNOSIS — N970 Female infertility associated with anovulation: Secondary | ICD-10-CM

## 2014-09-22 DIAGNOSIS — E669 Obesity, unspecified: Secondary | ICD-10-CM

## 2014-09-22 DIAGNOSIS — O99212 Obesity complicating pregnancy, second trimester: Secondary | ICD-10-CM

## 2014-09-22 NOTE — Progress Notes (Signed)
Patient is doing well without complaints. Patient is starting to eat more. Anatomy ultrasound scheduled in 3 weeks.

## 2014-09-25 ENCOUNTER — Telehealth (HOSPITAL_COMMUNITY): Payer: Self-pay | Admitting: MS"

## 2014-09-25 NOTE — Telephone Encounter (Signed)
Patient called to inquire about first trimester screen result, stated OB did not have the results yet. Attempted to call patient back to inform her that her screen was within normal limits for all conditions screened. The number that the patient left does not have a voicemail box set up yet. I was unable to leave a message. First trimester screen report is scanned into the patient's chart.   Chelsea Ferguson 09/25/2014 12:09 PM

## 2014-09-25 NOTE — Telephone Encounter (Signed)
Patient called to inquire about first trimester screen results. Patient identified by name and DOB. Discussed that these are within normal range for the conditions screened.   Chelsea BraunKaren Lasheena Frieze 09/25/2014 12:16 PM

## 2014-09-29 ENCOUNTER — Encounter (HOSPITAL_COMMUNITY): Payer: Self-pay | Admitting: Obstetrics and Gynecology

## 2014-09-29 ENCOUNTER — Inpatient Hospital Stay (HOSPITAL_COMMUNITY)
Admission: AD | Admit: 2014-09-29 | Discharge: 2014-09-29 | Disposition: A | Discharge status: Home / Self Care | Payer: Medicaid Other | Source: Ambulatory Visit | Attending: Obstetrics & Gynecology | Admitting: Obstetrics & Gynecology

## 2014-09-29 DIAGNOSIS — O99611 Diseases of the digestive system complicating pregnancy, first trimester: Secondary | ICD-10-CM | POA: Diagnosis not present

## 2014-09-29 DIAGNOSIS — K219 Gastro-esophageal reflux disease without esophagitis: Secondary | ICD-10-CM | POA: Diagnosis not present

## 2014-09-29 DIAGNOSIS — R079 Chest pain, unspecified: Secondary | ICD-10-CM | POA: Insufficient documentation

## 2014-09-29 DIAGNOSIS — O210 Mild hyperemesis gravidarum: Secondary | ICD-10-CM | POA: Diagnosis not present

## 2014-09-29 DIAGNOSIS — Z3A16 16 weeks gestation of pregnancy: Secondary | ICD-10-CM

## 2014-09-29 DIAGNOSIS — M940 Chondrocostal junction syndrome [Tietze]: Secondary | ICD-10-CM

## 2014-09-29 DIAGNOSIS — O219 Vomiting of pregnancy, unspecified: Secondary | ICD-10-CM

## 2014-09-29 HISTORY — DX: Headache: R51

## 2014-09-29 HISTORY — DX: Headache, unspecified: R51.9

## 2014-09-29 MED ORDER — DOXYLAMINE-PYRIDOXINE 10-10 MG PO TBEC: 2.0000 | DELAYED_RELEASE_TABLET | Freq: Every day | ORAL | Status: DC

## 2014-09-29 MED ORDER — RANITIDINE HCL 150 MG PO CAPS: 150.0000 mg | ORAL_CAPSULE | Freq: Every day | ORAL | Status: DC

## 2014-09-29 MED ORDER — GI COCKTAIL ~~LOC~~
30.0000 mL | Freq: Once | ORAL | Status: AC
  Administered 2014-09-29: 30 mL via ORAL
  Filled 2014-09-29: qty 30

## 2014-09-29 NOTE — MAU Note (Signed)
Was on a trip to [place] yesterday and did a lot of walking and riding; no problems with pregnancy so far; pain increases with ambulation; pain was rated at 6 earlier this AM but around 4; vomited twice yesterday;

## 2014-09-29 NOTE — MAU Note (Signed)
C/o chest pain that woke her up at 0300 this AM;

## 2014-09-29 NOTE — MAU Provider Note (Signed)
History     CSN: [id number]  Arrival date and time: 09/29/14 7829   First Provider Initiated Contact with Patient 09/29/14 [phone]      Chief Complaint  Patient presents with  . Chest Pain   HPI   [name] is a 19 y.o. female G1P0 at [redacted]w[redacted]d who presents with chest pain. She had 2 vomiting episodes yesterday, which is not uncommon for her in this pregnancy. The two episodes of vomiting were violent and she felt that she vomited to the point of dry heaving.  She has been having problems with heart burn off and on throughout the pregnancy as well and feels like she needs to burp a lot.   She eats a lot of greesey goods, and fast food, including french fries and cheese sticks. She also drinks a large amounts of soda; three cans per day.   She is receiving prenatal care at [facility] Next appointment is March 14th for [place] for anatomy scan.     OB History    Gravida Para Term Preterm AB TAB SAB Ectopic Multiple Living   1               Past Medical History  Diagnosis Date  . IBS (irritable bowel syndrome)   . Infertility, female   . PCOS (polycystic ovarian syndrome)   . Headache     Past Surgical History  Procedure Laterality Date  . No past surgeries      Family History  Problem Relation Age of Onset  . Diabetes Maternal Grandmother   . Hypertension Maternal Grandmother   . Hypertension Paternal Grandmother     History  Substance Use Topics  . Smoking status: Never Smoker   . Smokeless tobacco: Never Used  . Alcohol Use: No    Allergies: No Known Allergies  Prescriptions prior to admission  Medication Sig Dispense Refill Last Dose  . Prenatal Vit-Min-FA-Fish Oil ([facility] PRENATAL GUMMY PO) Take 2 tablets by mouth daily.   Taking   No results found for this or any previous visit (from the past 48 hour(s)).   Review of Systems  Respiratory: Negative for cough and shortness of breath.   Cardiovascular: Positive for chest pain (She can produce the  pain when she pushes on her upper chest).  Gastrointestinal: Positive for nausea and vomiting.  Neurological: Positive for headaches.   Physical Exam   Blood pressure 115/72, pulse 85, temperature 98.5 F (36.9 C), temperature source Oral, resp. rate 16, last menstrual period 06/07/2014.  Physical Exam  Constitutional: She is oriented to person, place, and time. She appears well-developed and well-nourished. No distress.  HENT:  Head: Normocephalic.  Eyes: Pupils are equal, round, and reactive to light.  Neck: Neck supple.  Cardiovascular: Normal rate, regular rhythm and normal heart sounds.   Respiratory: Effort normal and breath sounds normal. No respiratory distress. She exhibits mass and tenderness. She exhibits no edema, no deformity and no swelling.  GI: Soft.  Musculoskeletal: Normal range of motion.  Neurological: She is alert and oriented to person, place, and time.  Skin: Skin is warm. She is not diaphoretic. There is pallor.  Psychiatric: Her behavior is normal.    MAU Course  Procedures  None  MDM  + fetal heart tones.  Gi cocktail with some relief  Patient continues to feel the pain in her chest when she pushes on it. Heating pad given  EKG shows normal sinus rhythm.   Assessment and Plan  A:  Nausea and vomiting in pregnancy GERD Costochondritis.   P:  Discharge home in stable condition Recommend ice, heat and tylenol as directed on the bottle  RX: Zantac, diclegis  Follow up with OB as scheduled Return to MAU as needed, if symptoms worsen  GERD diet discussed  Limit caffeine     Iona HansenJennifer Irene Rocio Roam, NP 09/29/2014 9:42 AM

## 2014-09-29 NOTE — Discharge Instructions (Signed)

## 2014-09-29 NOTE — MAU Note (Signed)
Urine in lab 

## 2014-10-20 ENCOUNTER — Ambulatory Visit (HOSPITAL_COMMUNITY)
Admission: RE | Admit: 2014-10-20 | Discharge: 2014-10-20 | Disposition: A | Discharge status: Home / Self Care | Payer: Medicaid Other | Source: Ambulatory Visit | Attending: Obstetrics and Gynecology | Admitting: Obstetrics and Gynecology

## 2014-10-20 DIAGNOSIS — Z3A19 19 weeks gestation of pregnancy: Secondary | ICD-10-CM | POA: Insufficient documentation

## 2014-10-20 DIAGNOSIS — Z3402 Encounter for supervision of normal first pregnancy, second trimester: Secondary | ICD-10-CM | POA: Insufficient documentation

## 2014-10-20 DIAGNOSIS — Z3689 Encounter for other specified antenatal screening: Secondary | ICD-10-CM | POA: Insufficient documentation

## 2014-10-21 ENCOUNTER — Encounter: Payer: Self-pay | Admitting: Obstetrics & Gynecology

## 2014-10-21 ENCOUNTER — Ambulatory Visit (INDEPENDENT_AMBULATORY_CARE_PROVIDER_SITE_OTHER): Payer: Medicaid Other | Admitting: Obstetrics & Gynecology

## 2014-10-21 VITALS — BP 112/82 | HR 90 | Wt 175.0 lb

## 2014-10-21 DIAGNOSIS — Z3402 Encounter for supervision of normal first pregnancy, second trimester: Secondary | ICD-10-CM

## 2014-10-21 NOTE — Progress Notes (Signed)
Routine visit. Good FM. No problems. Discussed rec'd weight gain of less than 20 pounds.

## 2014-10-27 ENCOUNTER — Inpatient Hospital Stay (HOSPITAL_COMMUNITY)
Admission: AD | Admit: 2014-10-27 | Discharge: 2014-10-27 | Disposition: A | Discharge status: Home / Self Care | Payer: Medicaid Other | Source: Ambulatory Visit | Attending: Obstetrics & Gynecology | Admitting: Obstetrics & Gynecology

## 2014-10-27 ENCOUNTER — Encounter (HOSPITAL_COMMUNITY): Payer: Self-pay | Admitting: *Deleted

## 2014-10-27 DIAGNOSIS — Z3A20 20 weeks gestation of pregnancy: Secondary | ICD-10-CM | POA: Diagnosis not present

## 2014-10-27 DIAGNOSIS — O9989 Other specified diseases and conditions complicating pregnancy, childbirth and the puerperium: Secondary | ICD-10-CM | POA: Insufficient documentation

## 2014-10-27 DIAGNOSIS — K5900 Constipation, unspecified: Secondary | ICD-10-CM | POA: Insufficient documentation

## 2014-10-27 LAB — URINALYSIS, ROUTINE W REFLEX MICROSCOPIC
Bilirubin Urine: NEGATIVE
Glucose, UA: NEGATIVE mg/dL
Hgb urine dipstick: NEGATIVE
Ketones, ur: NEGATIVE mg/dL
LEUKOCYTES UA: NEGATIVE
NITRITE: NEGATIVE
Protein, ur: NEGATIVE mg/dL
Specific Gravity, Urine: 1.01 (ref 1.005–1.030)
UROBILINOGEN UA: 0.2 mg/dL (ref 0.0–1.0)
pH: 6 (ref 5.0–8.0)

## 2014-10-27 NOTE — MAU Provider Note (Signed)
  History   G1 at 20 weeks in with c/o several white stools today. That started this morning. Denies diarrhea, abd pain, or PICA.  CSN: [id number]  Arrival date and time: 10/27/14 1259   First Provider Initiated Contact with Patient 10/27/14 1612      Chief Complaint  Patient presents with  . white stool    HPI  OB History    Gravida Para Term Preterm AB TAB SAB Ectopic Multiple Living   1               Past Medical History  Diagnosis Date  . IBS (irritable bowel syndrome)   . Infertility, female   . PCOS (polycystic ovarian syndrome)   . Headache     Past Surgical History  Procedure Laterality Date  . No past surgeries      Family History  Problem Relation Age of Onset  . Diabetes Maternal Grandmother   . Hypertension Maternal Grandmother   . Hypertension Paternal Grandmother     History  Substance Use Topics  . Smoking status: Never Smoker   . Smokeless tobacco: Never Used  . Alcohol Use: No    Allergies: No Known Allergies  Prescriptions prior to admission  Medication Sig Dispense Refill Last Dose  . Doxylamine-Pyridoxine 10-10 MG TBEC Take 2 tablets by mouth at bedtime. Take 2 tablets at bedtime. If symptoms persist, add 1 tab in the AM starting on day 3. If symptoms persist, add 1 tab in the PM starting day 4. 90 tablet 1   . Prenatal Vit-Min-FA-Fish Oil ([facility] PRENATAL GUMMY PO) Take 2 tablets by mouth daily.   09/26/2014  . ranitidine (ZANTAC) 150 MG capsule Take 1 capsule (150 mg total) by mouth daily. 60 capsule 2     Review of Systems  Constitutional: Negative.   HENT: Negative.   Eyes: Negative.   Respiratory: Negative.   Cardiovascular: Negative.   Gastrointestinal:       White stool since this morning.  Genitourinary: Negative.   Musculoskeletal: Negative.   Skin: Negative.   Neurological: Negative.   Endo/Heme/Allergies: Negative.   Psychiatric/Behavioral: Negative.    Physical Exam   Blood pressure 122/70, pulse 87, temperature 98.4  F (36.9 C), temperature source Oral, resp. rate 18, last menstrual period 06/07/2014.  Physical Exam  Constitutional: She is oriented to person, place, and time. She appears well-developed and well-nourished.  HENT:  Head: Normocephalic.  Neck: Normal range of motion.  Cardiovascular: Normal rate, regular rhythm, normal heart sounds and intact distal pulses.   Respiratory: Effort normal and breath sounds normal.  GI: Soft. Bowel sounds are normal.  Musculoskeletal: Normal range of motion.  Neurological: She is alert and oriented to person, place, and time. She has normal reflexes.  Skin: Skin is warm and dry.  Psychiatric: She has a normal mood and affect. Her behavior is normal. Judgment and thought content normal.    MAU Course  Procedures  MDM D/c home  Assessment and Plan  G1 at 20 wks, change in stool since a.m., probable dietary cause after reviewing diet hx.  Wyvonnia DuskyLAWSON, London Tarnowski DARLENE 10/27/2014, 4:12 PM

## 2014-10-27 NOTE — MAU Note (Signed)
Pt states she had a lot of gas & abd discomfort yesterday, had BM today - stool was white.  Has had issues with constipation.  Has had decreased appetite during entire pregnancy.  Denies abd pain today, no nausea or vomiting - takes diclegis on a regular basis.  No vaginal bleeding.

## 2014-11-12 ENCOUNTER — Encounter: Payer: Self-pay | Admitting: Physician Assistant

## 2014-11-12 ENCOUNTER — Ambulatory Visit (INDEPENDENT_AMBULATORY_CARE_PROVIDER_SITE_OTHER): Payer: Medicaid Other | Admitting: Physician Assistant

## 2014-11-12 VITALS — BP 119/78 | HR 82 | Wt 176.0 lb

## 2014-11-12 DIAGNOSIS — Z36 Encounter for antenatal screening of mother: Secondary | ICD-10-CM | POA: Diagnosis not present

## 2014-11-12 DIAGNOSIS — Z3402 Encounter for supervision of normal first pregnancy, second trimester: Secondary | ICD-10-CM

## 2014-11-12 NOTE — Progress Notes (Signed)
22 weeks, endorses good fetal movement.  Denies LOF, VB, dysuria.  Does complain of clear/white mucus with stool along with significant constipation.  Pt unable to provide stool/mucus sample.    Will switch to flintstone sour gummies for vitamin instead of regular MVT.  Trial stool softener (OTC med list provided).  Trial of zantac. Will also continue Diclegis at this time.  Encouraged increased fluid and fiber intake.   RTC 4 weeks.  If no improvement, may return sooner.

## 2014-11-12 NOTE — Addendum Note (Signed)
Addended by: Tandy GawHINTON, Shantay Sonn C on: 11/12/2014 03:25 PM   Modules accepted: Orders

## 2014-11-12 NOTE — Patient Instructions (Signed)
Safe Over the Counter Medications in Pregnancy   Acne:  Benzoyl Peroxide  Salicylic Acid   Backache/Headache:  Tylenol: 2 regular strength every 4 hours OR        2 Extra strength every 6 hours   Colds/Coughs/Allergies:  Benadryl (alcohol free) 25 mg every 6 hours as needed  Breath right strips  Claritin  Cepacol throat lozenges  Chloraseptic throat spray  Cold-Eeze- up to three times per day  Cough drops, alcohol free  Flonase (by prescription only)  Guaifenesin  Mucinex  Robitussin DM (plain only, alcohol free)  Saline nasal spray/drops  Sudafed (pseudoephedrine) & Actifed * use only after [redacted] weeks gestation and if you do not have high blood pressure  Tylenol  Vicks Vaporub  Zinc lozenges  Zyrtec   Constipation:  Colace  Ducolax suppositories  Fleet enema  Glycerin suppositories  Metamucil  Milk of magnesia  Miralax  Senokot  Smooth move tea   Diarrhea:  Kaopectate  Imodium A-D   *NO pepto Bismol   Hemorrhoids:  Anusol  Anusol HC  Preparation H  Tucks   Indigestion:  Tums  Maalox  Mylanta  Zantac  Pepcid   Insomnia:  Benadryl (alcohol free)  every 6 hours as needed  Tylenol PM  Unisom, no Gelcaps   Leg Cramps:  Tums  MagGel   Nausea/Vomiting:  Bonine  Dramamine  Emetrol  Ginger extract  Sea bands  Meclizine  Nausea medication to take during pregnancy:  Unisom (doxylamine succinate 25 mg tablets) Take one tablet daily at bedtime. If symptoms are not adequately controlled, the dose can be increased to a maximum recommended dose of two tablets daily (1/2 tablet in the morning, 1/2 tablet mid-afternoon and one at bedtime).  Vitamin B6  tablets. Take one tablet twice a day (up to 200 mg per day).   Skin Rashes:  Aveeno products  Benadryl cream or  every 6 hours as needed  Calamine Lotion  1% cortisone cream   Yeast infection:  Gyne-lotrimin 7  Monistat 7    **If taking multiple medications, please check labels  to avoid duplicating the same active ingredients  **take medication as directed on the label  ** Do not exceed 4000 mg of tylenol in 24 hours  **Do not take medications that contain aspirin or ibuprofen        Second Trimester of Pregnancy The second trimester is from week 13 through week 28, months 4 through 6. The second trimester is often a time when you feel your best. Your body has also adjusted to being pregnant, and you begin to feel better physically. Usually, morning sickness has lessened or quit completely, you may have more energy, and you may have an increase in appetite. The second trimester is also a time when the fetus is growing rapidly. At the end of the sixth month, the fetus is about 9 inches long and weighs about 1 pounds. You will likely begin to feel the baby move (quickening) between 18 and 20 weeks of the pregnancy. BODY CHANGES Your body goes through many changes during pregnancy. The changes vary from woman to woman.   Your weight will continue to increase. You will notice your lower abdomen bulging out.  You may begin to get stretch marks on your hips, abdomen, and breasts.  You may develop headaches that can be relieved by medicines approved by your health care provider.  You may urinate more often because the fetus is pressing on your bladder.  You may  develop or continue to have heartburn as a result of your pregnancy.  You may develop constipation because certain hormones are causing the muscles that push waste through your intestines to slow down.  You may develop hemorrhoids or swollen, bulging veins (varicose veins).  You may have back pain because of the weight gain and pregnancy hormones relaxing your joints between the bones in your pelvis and as a result of a shift in weight and the muscles that support your balance.  Your breasts will continue to grow and be tender.  Your gums may bleed and may be sensitive to brushing and flossing.  Dark  spots or blotches (chloasma, mask of pregnancy) may develop on your face. This will likely fade after the baby is born.  A dark line from your belly button to the pubic area (linea nigra) may appear. This will likely fade after the baby is born.  You may have changes in your hair. These can include thickening of your hair, rapid growth, and changes in texture. Some women also have hair loss during or after pregnancy, or hair that feels dry or thin. Your hair will most likely return to normal after your baby is born. WHAT TO EXPECT AT YOUR PRENATAL VISITS During a routine prenatal visit:  You will be weighed to make sure you and the fetus are growing normally.  Your blood pressure will be taken.  Your abdomen will be measured to track your baby's growth.  The fetal heartbeat will be listened to.  Any test results from the previous visit will be discussed. Your health care provider may ask you:  How you are feeling.  If you are feeling the baby move.  If you have had any abnormal symptoms, such as leaking fluid, bleeding, severe headaches, or abdominal cramping.  If you have any questions. Other tests that may be performed during your second trimester include:  Blood tests that check for:  Low iron levels (anemia).  Gestational diabetes (between 24 and 28 weeks).  Rh antibodies.  Urine tests to check for infections, diabetes, or protein in the urine.  An ultrasound to confirm the proper growth and development of the baby.  An amniocentesis to check for possible genetic problems.  Fetal screens for spina bifida and Down syndrome. HOME CARE INSTRUCTIONS   Avoid all smoking, herbs, alcohol, and unprescribed drugs. These chemicals affect the formation and growth of the baby.  Follow your health care provider's instructions regarding medicine use. There are medicines that are either safe or unsafe to take during pregnancy.  Exercise only as directed by your health care  provider. Experiencing uterine cramps is a good sign to stop exercising.  Continue to eat regular, healthy meals.  Wear a good support bra for breast tenderness.  Do not use hot tubs, steam rooms, or saunas.  Wear your seat belt at all times when driving.  Avoid raw meat, uncooked cheese, cat litter boxes, and soil used by cats. These carry germs that can cause birth defects in the baby.  Take your prenatal vitamins.  Try taking a stool softener (if your health care provider approves) if you develop constipation. Eat more high-fiber foods, such as fresh vegetables or fruit and whole grains. Drink plenty of fluids to keep your urine clear or pale yellow.  Take warm sitz baths to soothe any pain or discomfort caused by hemorrhoids. Use hemorrhoid cream if your health care provider approves.  If you develop varicose veins, wear support hose. Elevate your feet  for 15 minutes, 3-4 times a day. Limit salt in your diet.  Avoid heavy lifting, wear low heel shoes, and practice good posture.  Rest with your legs elevated if you have leg cramps or low back pain.  Visit your dentist if you have not gone yet during your pregnancy. Use a soft toothbrush to brush your teeth and be gentle when you floss.  A sexual relationship may be continued unless your health care provider directs you otherwise.  Continue to go to all your prenatal visits as directed by your health care provider. SEEK MEDICAL CARE IF:   You have dizziness.  You have mild pelvic cramps, pelvic pressure, or nagging pain in the abdominal area.  You have persistent nausea, vomiting, or diarrhea.  You have a bad smelling vaginal discharge.  You have pain with urination. SEEK IMMEDIATE MEDICAL CARE IF:   You have a fever.  You are leaking fluid from your vagina.  You have spotting or bleeding from your vagina.  You have severe abdominal cramping or pain.  You have rapid weight gain or loss.  You have shortness of  breath with chest pain.  You notice sudden or extreme swelling of your face, hands, ankles, feet, or legs.  You have not felt your baby move in over an hour.  You have severe headaches that do not go away with medicine.  You have vision changes. Document Released: 07/19/2001 Document Revised: 07/30/2013 Document Reviewed: 09/25/2012 [facility] Patient Information 2015 [place], [place]. This information is not intended to replace advice given to you by your health care provider. Make sure you discuss any questions you have with your health care provider.

## 2014-11-13 LAB — GLUCOSE TOLERANCE, 1 HOUR (50G) W/O FASTING: GLUCOSE 1 HOUR GTT: 134 mg/dL (ref 70–140)

## 2014-11-18 ENCOUNTER — Encounter: Payer: Medicaid Other | Admitting: Family Medicine

## 2014-11-19 ENCOUNTER — Encounter: Payer: Medicaid Other | Admitting: Advanced Practice Midwife

## 2014-12-09 ENCOUNTER — Ambulatory Visit (INDEPENDENT_AMBULATORY_CARE_PROVIDER_SITE_OTHER): Payer: Medicaid Other | Admitting: Obstetrics & Gynecology

## 2014-12-09 VITALS — BP 129/80 | HR 103 | Wt 184.0 lb

## 2014-12-09 DIAGNOSIS — Z3402 Encounter for supervision of normal first pregnancy, second trimester: Secondary | ICD-10-CM

## 2014-12-09 NOTE — Progress Notes (Signed)
Routine visit. Good FM. No problems. Glucola, labs, tdap at next visit. 

## 2014-12-11 ENCOUNTER — Ambulatory Visit (HOSPITAL_COMMUNITY)
Admission: RE | Admit: 2014-12-11 | Discharge: 2014-12-11 | Disposition: A | Discharge status: Home / Self Care | Payer: Medicaid Other | Source: Ambulatory Visit | Attending: Physician Assistant | Admitting: Physician Assistant

## 2014-12-11 ENCOUNTER — Ambulatory Visit (HOSPITAL_COMMUNITY): Payer: Medicaid Other

## 2014-12-11 DIAGNOSIS — Z3402 Encounter for supervision of normal first pregnancy, second trimester: Secondary | ICD-10-CM

## 2014-12-11 DIAGNOSIS — Z3A27 27 weeks gestation of pregnancy: Secondary | ICD-10-CM | POA: Insufficient documentation

## 2014-12-11 DIAGNOSIS — O09812 Supervision of pregnancy resulting from assisted reproductive technology, second trimester: Secondary | ICD-10-CM | POA: Diagnosis not present

## 2014-12-11 DIAGNOSIS — Z36 Encounter for antenatal screening of mother: Secondary | ICD-10-CM | POA: Diagnosis present

## 2014-12-23 ENCOUNTER — Ambulatory Visit (INDEPENDENT_AMBULATORY_CARE_PROVIDER_SITE_OTHER): Payer: Medicaid Other | Admitting: Obstetrics & Gynecology

## 2014-12-23 VITALS — BP 121/81 | HR 85 | Wt 184.0 lb

## 2014-12-23 DIAGNOSIS — Z36 Encounter for antenatal screening of mother: Secondary | ICD-10-CM

## 2014-12-23 DIAGNOSIS — Z23 Encounter for immunization: Secondary | ICD-10-CM | POA: Diagnosis not present

## 2014-12-23 DIAGNOSIS — Z0489 Encounter for examination and observation for other specified reasons: Secondary | ICD-10-CM

## 2014-12-23 DIAGNOSIS — Z3403 Encounter for supervision of normal first pregnancy, third trimester: Secondary | ICD-10-CM

## 2014-12-23 DIAGNOSIS — IMO0002 Reserved for concepts with insufficient information to code with codable children: Secondary | ICD-10-CM

## 2014-12-23 NOTE — Patient Instructions (Addendum)
Return to clinic for any obstetric concerns or go to [facility] for evaluation Gestational Diabetes Mellitus Gestational diabetes mellitus, often simply referred to as gestational diabetes, is a type of diabetes that some women develop during pregnancy. In gestational diabetes, the pancreas does not make enough insulin (a hormone), the cells are less responsive to the insulin that is made (insulin resistance), or both.Normally, insulin moves sugars from food into the tissue cells. The tissue cells use the sugars for energy. The lack of insulin or the lack of normal response to insulin causes excess sugars to build up in the blood instead of going into the tissue cells. As a result, high blood sugar (hyperglycemia) develops. The effect of high sugar (glucose) levels can cause many problems.  RISK FACTORS You have an increased chance of developing gestational diabetes if you have a family history of diabetes and also have one or more of the following risk factors:  A body mass index over 30 (obesity).  A previous pregnancy with gestational diabetes.  An older age at the time of pregnancy. If blood glucose levels are kept in the normal range during pregnancy, women can have a healthy pregnancy. If your blood glucose levels are not well controlled, there may be risks to you, your unborn baby (fetus), your labor and delivery, or your newborn baby.  SYMPTOMS  If symptoms are experienced, they are much like symptoms you would normally expect during pregnancy. The symptoms of gestational diabetes include:   Increased thirst (polydipsia).  Increased urination (polyuria).  Increased urination during the night (nocturia).  Weight loss. This weight loss may be rapid.  Frequent, recurring infections.  Tiredness (fatigue).  Weakness.  Vision changes, such as blurred vision.  Fruity smell to your breath.  Abdominal pain. DIAGNOSIS Diabetes is diagnosed when blood glucose levels are increased. Your  blood glucose level may be checked by one or more of the following blood tests:  A fasting blood glucose test. You will not be allowed to eat for at least 8 hours before a blood sample is taken.  A random blood glucose test. Your blood glucose is checked at any time of the day regardless of when you ate.  A hemoglobin A1c blood glucose test. A hemoglobin A1c test provides information about blood glucose control over the previous 3 months.  An oral glucose tolerance test (OGTT). Your blood glucose is measured after you have not eaten (fasted) for 1-3 hours and then after you drink a glucose-containing beverage. Since the hormones that cause insulin resistance are highest at about 24-28 weeks of a pregnancy, an OGTT is usually performed during that time. If you have risk factors for gestational diabetes, your health care provider may test you for gestational diabetes earlier than 24 weeks of pregnancy. TREATMENT   You will need to take diabetes medicine or insulin daily to keep blood glucose levels in the desired range.  You will need to match insulin dosing with exercise and healthy food choices. The treatment goal is to maintain the before-meal (preprandial), bedtime, and overnight blood glucose level at 60-99 mg/dL during pregnancy. The treatment goal is to further maintain peak after-meal blood sugar (postprandial glucose) level at 100-140 mg/dL. HOME CARE INSTRUCTIONS   Have your hemoglobin A1c level checked twice a year.  Perform daily blood glucose monitoring as directed by your health care provider. It is common to perform frequent blood glucose monitoring.  Monitor urine ketones when you are ill and as directed by your health care provider.  Take your diabetes medicine and insulin as directed by your health care provider to maintain your blood glucose level in the desired range.  Never run out of diabetes medicine or insulin. It is needed every day.  Adjust insulin based on your  intake of carbohydrates. Carbohydrates can raise blood glucose levels but need to be included in your diet. Carbohydrates provide vitamins, minerals, and fiber which are an essential part of a healthy diet. Carbohydrates are found in fruits, vegetables, whole grains, dairy products, legumes, and foods containing added sugars.  Eat healthy foods. Alternate 3 meals with 3 snacks.  Maintain a healthy weight gain. The usual total expected weight gain varies according to your prepregnancy body mass index (BMI).  Carry a medical alert card or wear your medical alert jewelry.  Carry a 15-gram carbohydrate snack with you at all times to treat low blood glucose (hypoglycemia). Some examples of 15-gram carbohydrate snacks include:  Glucose tablets, 3 or 4.  Glucose gel, 15-gram tube.  Raisins, 2 tablespoons (24 g).  Jelly beans, 6.  Animal crackers, 8.  Fruit juice, regular soda, or low-fat milk, 4 ounces (120 mL).  Gummy treats, 9.  Recognize hypoglycemia. Hypoglycemia during pregnancy occurs with blood glucose levels of 60 mg/dL and below. The risk for hypoglycemia increases when fasting or skipping meals, during or after intense exercise, and during sleep. Hypoglycemia symptoms can include:  Tremors or shakes.  Decreased ability to concentrate.  Sweating.  Increased heart rate.  Headache.  Dry mouth.  Hunger.  Irritability.  Anxiety.  Restless sleep.  Altered speech or coordination.  Confusion.  Treat hypoglycemia promptly. If you are alert and able to safely swallow, follow the 15:15 rule:  Take 15-20 grams of rapid-acting glucose or carbohydrate. Rapid-acting options include glucose gel, glucose tablets, or 4 ounces (120 mL) of fruit juice, regular soda, or low-fat milk.  Check your blood glucose level 15 minutes after taking the glucose.  Take 15-20 grams more of glucose if the repeat blood glucose level is still 70 mg/dL or below.  Eat a meal or snack within 1  hour once blood glucose levels return to normal.  Be alert to polyuria (excess urination) and polydipsia (excess thirst) which are early signs of hyperglycemia. An early awareness of hyperglycemia allows for prompt treatment. Treat hyperglycemia as directed by your health care provider.  Engage in at least 30 minutes of physical activity a day or as directed by your health care provider. Ten minutes of physical activity timed 30 minutes after each meal is encouraged to control postprandial blood glucose levels.  Adjust your insulin dosing and food intake as needed if you start a new exercise or sport.  Follow your sick-day plan at any time you are unable to eat or drink as usual.  Avoid tobacco and alcohol use.  Keep all follow-up visits as directed by your health care provider.  Follow the advice of your health care provider regarding your prenatal and post-delivery (postpartum) appointments, meal planning, exercise, medicines, vitamins, blood tests, other medical tests, and physical activities.  Perform daily skin and foot care. Examine your skin and feet daily for cuts, bruises, redness, nail problems, bleeding, blisters, or sores.  Brush your teeth and gums at least twice a day and floss at least once a day. Follow up with your dentist regularly.  Schedule an eye exam during the first trimester of your pregnancy or as directed by your health care provider.  Share your diabetes management plan with your workplace  or school.  Stay up-to-date with immunizations.  Learn to manage stress.  Obtain ongoing diabetes education and support as needed.  Learn about and consider breastfeeding your baby.  You should have your blood sugar level checked 6-12 weeks after delivery. This is done with an oral glucose tolerance test (OGTT). SEEK MEDICAL CARE IF:   You are unable to eat food or drink fluids for more than 6 hours.  You have nausea and vomiting for more than 6 hours.  You have a  blood glucose level of 200 mg/dL and you have ketones in your urine.  There is a change in mental status.  You develop vision problems.  You have a persistent headache.  You have upper abdominal pain or discomfort.  You develop an additional serious illness.  You have diarrhea for more than 6 hours.  You have been sick or have had a fever for a couple of days and are not getting better. SEEK IMMEDIATE MEDICAL CARE IF:   You have difficulty breathing.  You no longer feel the baby moving.  You are bleeding or have discharge from your vagina.  You start having premature contractions or labor. MAKE SURE YOU:  Understand these instructions.  Will watch your condition.  Will get help right away if you are not doing well or get worse. Document Released: 10/31/2000 Document Revised: 12/09/2013 Document Reviewed: 02/21/2012 [facility] Patient Information 2015 [employer] [place], [place]. This information is not intended to replace advice given to you by your health care provider. Make sure you discuss any questions you have with your health care provider.

## 2014-12-23 NOTE — Progress Notes (Signed)
Third trimester labs, Tdap today. Follow up anatomy scan ordered as per patient, wants to make sure everything is okay with baby.  No other complaints or concerns.  Labor and fetal movement precautions reviewed.

## 2014-12-24 LAB — HIV ANTIBODY (ROUTINE TESTING W REFLEX): HIV: NONREACTIVE

## 2014-12-24 LAB — CBC
HEMATOCRIT: 29.7 % — AB (ref 36.0–46.0)
HEMOGLOBIN: 9.8 g/dL — AB (ref 12.0–15.0)
MCH: 26.9 pg (ref 26.0–34.0)
MCHC: 33 g/dL (ref 30.0–36.0)
MCV: 81.6 fL (ref 78.0–100.0)
MPV: 10.3 fL (ref 8.6–12.4)
Platelets: 297 10*3/uL (ref 150–400)
RBC: 3.64 MIL/uL — ABNORMAL LOW (ref 3.87–5.11)
RDW: 15.2 % (ref 11.5–15.5)
WBC: 9.4 10*3/uL (ref 4.0–10.5)

## 2014-12-24 LAB — RPR

## 2014-12-24 LAB — GLUCOSE TOLERANCE, 1 HOUR (50G) W/O FASTING: Glucose, 1 Hour GTT: 134 mg/dL (ref 70–140)

## 2014-12-25 ENCOUNTER — Other Ambulatory Visit: Payer: Self-pay | Admitting: Obstetrics & Gynecology

## 2014-12-25 DIAGNOSIS — O99019 Anemia complicating pregnancy, unspecified trimester: Secondary | ICD-10-CM

## 2014-12-25 MED ORDER — DOCUSATE SODIUM 100 MG PO CAPS: 100.0000 mg | ORAL_CAPSULE | Freq: Two times a day (BID) | ORAL | Status: DC | PRN

## 2014-12-25 MED ORDER — FERROUS SULFATE 325 (65 FE) MG PO TABS: 325.0000 mg | ORAL_TABLET | Freq: Three times a day (TID) | ORAL | Status: DC

## 2014-12-29 ENCOUNTER — Telehealth: Payer: Self-pay | Admitting: Obstetrics & Gynecology

## 2014-12-29 DIAGNOSIS — O99012 Anemia complicating pregnancy, second trimester: Secondary | ICD-10-CM

## 2014-12-29 MED ORDER — FERROUS SULFATE 325 (65 FE) MG PO TABS: 325.0000 mg | ORAL_TABLET | Freq: Three times a day (TID) | ORAL | Status: DC

## 2014-12-29 NOTE — Telephone Encounter (Signed)
Patient called stating pharmacy did not have her iron Rx, checked epic, looks like it was ordered incorrectly.  Reordered and sent to pharmacy.

## 2015-01-06 ENCOUNTER — Ambulatory Visit (HOSPITAL_COMMUNITY)
Admission: RE | Admit: 2015-01-06 | Discharge: 2015-01-06 | Disposition: A | Discharge status: Home / Self Care | Payer: Medicaid Other | Source: Ambulatory Visit | Attending: Obstetrics & Gynecology | Admitting: Obstetrics & Gynecology

## 2015-01-06 ENCOUNTER — Ambulatory Visit (HOSPITAL_COMMUNITY): Payer: Medicaid Other

## 2015-01-06 ENCOUNTER — Ambulatory Visit (INDEPENDENT_AMBULATORY_CARE_PROVIDER_SITE_OTHER): Payer: Medicaid Other | Admitting: Family Medicine

## 2015-01-06 VITALS — BP 125/83 | HR 83 | Wt 187.0 lb

## 2015-01-06 DIAGNOSIS — O99212 Obesity complicating pregnancy, second trimester: Secondary | ICD-10-CM

## 2015-01-06 DIAGNOSIS — E669 Obesity, unspecified: Secondary | ICD-10-CM

## 2015-01-06 DIAGNOSIS — Z3403 Encounter for supervision of normal first pregnancy, third trimester: Secondary | ICD-10-CM | POA: Insufficient documentation

## 2015-01-06 DIAGNOSIS — IMO0002 Reserved for concepts with insufficient information to code with codable children: Secondary | ICD-10-CM

## 2015-01-06 DIAGNOSIS — Z0489 Encounter for examination and observation for other specified reasons: Secondary | ICD-10-CM

## 2015-01-06 NOTE — Patient Instructions (Signed)
Third Trimester of Pregnancy The third trimester is from week 29 through week 42, months 7 through 9. The third trimester is a time when the fetus is growing rapidly. At the end of the ninth month, the fetus is about 20 inches in length and weighs 6-10 pounds.  BODY CHANGES Your body goes through many changes during pregnancy. The changes vary from woman to woman.   Your weight will continue to increase. You can expect to gain 25-35 pounds (11-16 kg) by the end of the pregnancy.  You may begin to get stretch marks on your hips, abdomen, and breasts.  You may urinate more often because the fetus is moving lower into your pelvis and pressing on your bladder.  You may develop or continue to have heartburn as a result of your pregnancy.  You may develop constipation because certain hormones are causing the muscles that push waste through your intestines to slow down.  You may develop hemorrhoids or swollen, bulging veins (varicose veins).  You may have pelvic pain because of the weight gain and pregnancy hormones relaxing your joints between the bones in your pelvis. Backaches may result from overexertion of the muscles supporting your posture.  You may have changes in your hair. These can include thickening of your hair, rapid growth, and changes in texture. Some women also have hair loss during or after pregnancy, or hair that feels dry or thin. Your hair will most likely return to normal after your baby is born.  Your breasts will continue to grow and be tender. A yellow discharge may leak from your breasts called colostrum.  Your belly button may stick out.  You may feel short of breath because of your expanding uterus.  You may notice the fetus "dropping," or moving lower in your abdomen.  You may have a bloody mucus discharge. This usually occurs a few days to a week before labor begins.  Your cervix becomes thin and soft (effaced) near your due date. WHAT TO EXPECT AT YOUR  PRENATAL EXAMS  You will have prenatal exams every 2 weeks until week 36. Then, you will have weekly prenatal exams. During a routine prenatal visit:  You will be weighed to make sure you and the fetus are growing normally.  Your blood pressure is taken.  Your abdomen will be measured to track your baby's growth.  The fetal heartbeat will be listened to.  Any test results from the previous visit will be discussed.  You may have a cervical check near your due date to see if you have effaced. At around 36 weeks, your caregiver will check your cervix. At the same time, your caregiver will also perform a test on the secretions of the vaginal tissue. This test is to determine if a type of bacteria, Group B streptococcus, is present. Your caregiver will explain this further. Your caregiver may ask you:  What your birth plan is.  How you are feeling.  If you are feeling the baby move.  If you have had any abnormal symptoms, such as leaking fluid, bleeding, severe headaches, or abdominal cramping.  If you have any questions. Other tests or screenings that may be performed during your third trimester include:  Blood tests that check for low iron levels (anemia).  Fetal testing to check the health, activity level, and growth of the fetus. Testing is done if you have certain medical conditions or if there are problems during the pregnancy. FALSE LABOR You may feel small, irregular contractions that   eventually go away. These are called Braxton Hicks contractions, or false labor. Contractions may last for hours, days, or even weeks before true labor sets in. If contractions come at regular intervals, intensify, or become painful, it is best to be seen by your caregiver.  SIGNS OF LABOR   Menstrual-like cramps.  Contractions that are 5 minutes apart or less.  Contractions that start on the top of the uterus and spread down to the lower abdomen and back.  A sense of increased pelvic  pressure or back pain.  A watery or bloody mucus discharge that comes from the vagina. If you have any of these signs before the 37th week of pregnancy, call your caregiver right away. You need to go to the hospital to get checked immediately. HOME CARE INSTRUCTIONS   Avoid all smoking, herbs, alcohol, and unprescribed drugs. These chemicals affect the formation and growth of the baby.  Follow your caregiver's instructions regarding medicine use. There are medicines that are either safe or unsafe to take during pregnancy.  Exercise only as directed by your caregiver. Experiencing uterine cramps is a good sign to stop exercising.  Continue to eat regular, healthy meals.  Wear a good support bra for breast tenderness.  Do not use hot tubs, steam rooms, or saunas.  Wear your seat belt at all times when driving.  Avoid raw meat, uncooked cheese, cat litter boxes, and soil used by cats. These carry germs that can cause birth defects in the baby.  Take your prenatal vitamins.  Try taking a stool softener (if your caregiver approves) if you develop constipation. Eat more high-fiber foods, such as fresh vegetables or fruit and whole grains. Drink plenty of fluids to keep your urine clear or pale yellow.  Take warm sitz baths to soothe any pain or discomfort caused by hemorrhoids. Use hemorrhoid cream if your caregiver approves.  If you develop varicose veins, wear support hose. Elevate your feet for 15 minutes, 3-4 times a day. Limit salt in your diet.  Avoid heavy lifting, wear low heal shoes, and practice good posture.  Rest a lot with your legs elevated if you have leg cramps or low back pain.  Visit your dentist if you have not gone during your pregnancy. Use a soft toothbrush to brush your teeth and be gentle when you floss.  A sexual relationship may be continued unless your caregiver directs you otherwise.  Do not travel far distances unless it is absolutely necessary and only  with the approval of your caregiver.  Take prenatal classes to understand, practice, and ask questions about the labor and delivery.  Make a trial run to the hospital.  Pack your hospital bag.  Prepare the baby's nursery.  Continue to go to all your prenatal visits as directed by your caregiver. SEEK MEDICAL CARE IF:  You are unsure if you are in labor or if your water has broken.  You have dizziness.  You have mild pelvic cramps, pelvic pressure, or nagging pain in your abdominal area.  You have persistent nausea, vomiting, or diarrhea.  You have a bad smelling vaginal discharge.  You have pain with urination. SEEK IMMEDIATE MEDICAL CARE IF:   You have a fever.  You are leaking fluid from your vagina.  You have spotting or bleeding from your vagina.  You have severe abdominal cramping or pain.  You have rapid weight loss or gain.  You have shortness of breath with chest pain.  You notice sudden or extreme swelling   of your face, hands, ankles, feet, or legs.  You have not felt your baby move in over an hour.  You have severe headaches that do not go away with medicine.  You have vision changes. Document Released: 07/19/2001 Document Revised: 07/30/2013 Document Reviewed: 09/25/2012 [employer] Patient Information 2015 [employer]. This information is not intended to replace advice given to you by your health care provider. Make sure you discuss any questions you have with your health care provider.  Breastfeeding Deciding to breastfeed is one of the best choices you can make for you and your baby. A change in hormones during pregnancy causes your breast tissue to grow and increases the number and size of your milk ducts. These hormones also allow proteins, sugars, and fats from your blood supply to make breast milk in your milk-producing glands. Hormones prevent breast milk from being released before your baby is born as well as prompt milk flow after birth. Once  breastfeeding has begun, thoughts of your baby, as well as his or her sucking or crying, can stimulate the release of milk from your milk-producing glands.  BENEFITS OF BREASTFEEDING For Your Baby  Your first milk (colostrum) helps your baby's digestive system function better.   There are antibodies in your milk that help your baby fight off infections.   Your baby has a lower incidence of asthma, allergies, and sudden infant death syndrome.   The nutrients in breast milk are better for your baby than infant formulas and are designed uniquely for your baby's needs.   Breast milk improves your baby's brain development.   Your baby is less likely to develop other conditions, such as childhood obesity, asthma, or type 2 diabetes mellitus.  For You   Breastfeeding helps to create a very special bond between you and your baby.   Breastfeeding is convenient. Breast milk is always available at the correct temperature and costs nothing.   Breastfeeding helps to burn calories and helps you lose the weight gained during pregnancy.   Breastfeeding makes your uterus contract to its prepregnancy size faster and slows bleeding (lochia) after you give birth.   Breastfeeding helps to lower your risk of developing type 2 diabetes mellitus, osteoporosis, and breast or ovarian cancer later in life. SIGNS THAT YOUR BABY IS HUNGRY Early Signs of Hunger  Increased alertness or activity.  Stretching.  Movement of the head from side to side.  Movement of the head and opening of the mouth when the corner of the mouth or cheek is stroked (rooting).  Increased sucking sounds, smacking lips, cooing, sighing, or squeaking.  Hand-to-mouth movements.  Increased sucking of fingers or hands. Late Signs of Hunger  Fussing.  Intermittent crying. Extreme Signs of Hunger Signs of extreme hunger will require calming and consoling before your baby will be able to breastfeed successfully. Do not  wait for the following signs of extreme hunger to occur before you initiate breastfeeding:   Restlessness.  A loud, strong cry.   Screaming. BREASTFEEDING BASICS Breastfeeding Initiation  Find a comfortable place to sit or lie down, with your neck and back well supported.  Place a pillow or rolled up blanket under your baby to bring him or her to the level of your breast (if you are seated). Nursing pillows are specially designed to help support your arms and your baby while you breastfeed.  Make sure that your baby's abdomen is facing your abdomen.   Gently massage your breast. With your fingertips, massage from your chest   wall toward your nipple in a circular motion. This encourages milk flow. You may need to continue this action during the feeding if your milk flows slowly.  Support your breast with 4 fingers underneath and your thumb above your nipple. Make sure your fingers are well away from your nipple and your baby's mouth.   Stroke your baby's lips gently with your finger or nipple.   When your baby's mouth is open wide enough, quickly bring your baby to your breast, placing your entire nipple and as much of the colored area around your nipple (areola) as possible into your baby's mouth.   More areola should be visible above your baby's upper lip than below the lower lip.   Your baby's tongue should be between his or her lower gum and your breast.   Ensure that your baby's mouth is correctly positioned around your nipple (latched). Your baby's lips should create a seal on your breast and be turned out (everted).  It is common for your baby to suck about 2-3 minutes in order to start the flow of breast milk. Latching Teaching your baby how to latch on to your breast properly is very important. An improper latch can cause nipple pain and decreased milk supply for you and poor weight gain in your baby. Also, if your baby is not latched onto your nipple properly, he or she  may swallow some air during feeding. This can make your baby fussy. Burping your baby when you switch breasts during the feeding can help to get rid of the air. However, teaching your baby to latch on properly is still the best way to prevent fussiness from swallowing air while breastfeeding. Signs that your baby has successfully latched on to your nipple:    Silent tugging or silent sucking, without causing you pain.   Swallowing heard between every 3-4 sucks.    Muscle movement above and in front of his or her ears while sucking.  Signs that your baby has not successfully latched on to nipple:   Sucking sounds or smacking sounds from your baby while breastfeeding.  Nipple pain. If you think your baby has not latched on correctly, slip your finger into the corner of your baby's mouth to break the suction and place it between your baby's gums. Attempt breastfeeding initiation again. Signs of Successful Breastfeeding Signs from your baby:   A gradual decrease in the number of sucks or complete cessation of sucking.   Falling asleep.   Relaxation of his or her body.   Retention of a small amount of milk in his or her mouth.   Letting go of your breast by himself or herself. Signs from you:  Breasts that have increased in firmness, weight, and size 1-3 hours after feeding.   Breasts that are softer immediately after breastfeeding.  Increased milk volume, as well as a change in milk consistency and color by the fifth day of breastfeeding.   Nipples that are not sore, cracked, or bleeding. Signs That Your Baby is Getting Enough Milk  Wetting at least 3 diapers in a 24-hour period. The urine should be clear and pale yellow by age 5 days.  At least 3 stools in a 24-hour period by age 5 days. The stool should be soft and yellow.  At least 3 stools in a 24-hour period by age 7 days. The stool should be seedy and yellow.  No loss of weight greater than 10% of birth weight  during the first 3   days of age.  Average weight gain of 4-7 ounces (113-198 g) per week after age 4 days.  Consistent daily weight gain by age 5 days, without weight loss after the age of 2 weeks. After a feeding, your baby may spit up a small amount. This is common. BREASTFEEDING FREQUENCY AND DURATION Frequent feeding will help you make more milk and can prevent sore nipples and breast engorgement. Breastfeed when you feel the need to reduce the fullness of your breasts or when your baby shows signs of hunger. This is called "breastfeeding on demand." Avoid introducing a pacifier to your baby while you are working to establish breastfeeding (the first 4-6 weeks after your baby is born). After this time you may choose to use a pacifier. Research has shown that pacifier use during the first year of a baby's life decreases the risk of sudden infant death syndrome (SIDS). Allow your baby to feed on each breast as long as he or she wants. Breastfeed until your baby is finished feeding. When your baby unlatches or falls asleep while feeding from the first breast, offer the second breast. Because newborns are often sleepy in the first few weeks of life, you may need to awaken your baby to get him or her to feed. Breastfeeding times will vary from baby to baby. However, the following rules can serve as a guide to help you ensure that your baby is properly fed:  Newborns (babies 4 weeks of age or younger) may breastfeed every 1-3 hours.  Newborns should not go longer than 3 hours during the day or 5 hours during the night without breastfeeding.  You should breastfeed your baby a minimum of 8 times in a 24-hour period until you begin to introduce solid foods to your baby at around 6 months of age. BREAST MILK PUMPING Pumping and storing breast milk allows you to ensure that your baby is exclusively fed your breast milk, even at times when you are unable to breastfeed. This is especially important if you are  going back to work while you are still breastfeeding or when you are not able to be present during feedings. Your lactation consultant can give you guidelines on how long it is safe to store breast milk.  A breast pump is a machine that allows you to pump milk from your breast into a sterile bottle. The pumped breast milk can then be stored in a refrigerator or freezer. Some breast pumps are operated by hand, while others use electricity. Ask your lactation consultant which type will work best for you. Breast pumps can be purchased, but some hospitals and breastfeeding support groups lease breast pumps on a monthly basis. A lactation consultant can teach you how to hand express breast milk, if you prefer not to use a pump.  CARING FOR YOUR BREASTS WHILE YOU BREASTFEED Nipples can become dry, cracked, and sore while breastfeeding. The following recommendations can help keep your breasts moisturized and healthy:  Avoid using soap on your nipples.   Wear a supportive bra. Although not required, special nursing bras and tank tops are designed to allow access to your breasts for breastfeeding without taking off your entire bra or top. Avoid wearing underwire-style bras or extremely tight bras.  Air dry your nipples for 3-4minutes after each feeding.   Use only cotton bra pads to absorb leaked breast milk. Leaking of breast milk between feedings is normal.   Use lanolin on your nipples after breastfeeding. Lanolin helps to maintain your skin's   normal moisture barrier. If you use pure lanolin, you do not need to wash it off before feeding your baby again. Pure lanolin is not toxic to your baby. You may also hand express a few drops of breast milk and gently massage that milk into your nipples and allow the milk to air dry. In the first few weeks after giving birth, some women experience extremely full breasts (engorgement). Engorgement can make your breasts feel heavy, warm, and tender to the touch.  Engorgement peaks within 3-5 days after you give birth. The following recommendations can help ease engorgement:  Completely empty your breasts while breastfeeding or pumping. You may want to start by applying warm, moist heat (in the shower or with warm water-soaked hand towels) just before feeding or pumping. This increases circulation and helps the milk flow. If your baby does not completely empty your breasts while breastfeeding, pump any extra milk after he or she is finished.  Wear a snug bra (nursing or regular) or tank top for 1-2 days to signal your body to slightly decrease milk production.  Apply ice packs to your breasts, unless this is too uncomfortable for you.  Make sure that your baby is latched on and positioned properly while breastfeeding. If engorgement persists after 48 hours of following these recommendations, contact your health care provider or a lactation consultant. OVERALL HEALTH CARE RECOMMENDATIONS WHILE BREASTFEEDING  Eat healthy foods. Alternate between meals and snacks, eating 3 of each per day. Because what you eat affects your breast milk, some of the foods may make your baby more irritable than usual. Avoid eating these foods if you are sure that they are negatively affecting your baby.  Drink milk, fruit juice, and water to satisfy your thirst (about 10 glasses a day).   Rest often, relax, and continue to take your prenatal vitamins to prevent fatigue, stress, and anemia.  Continue breast self-awareness checks.  Avoid chewing and smoking tobacco.  Avoid alcohol and drug use. Some medicines that may be harmful to your baby can pass through breast milk. It is important to ask your health care provider before taking any medicine, including all over-the-counter and prescription medicine as well as vitamin and herbal supplements. It is possible to become pregnant while breastfeeding. If birth control is desired, ask your health care provider about options that  will be safe for your baby. SEEK MEDICAL CARE IF:   You feel like you want to stop breastfeeding or have become frustrated with breastfeeding.  You have painful breasts or nipples.  Your nipples are cracked or bleeding.  Your breasts are red, tender, or warm.  You have a swollen area on either breast.  You have a fever or chills.  You have nausea or vomiting.  You have drainage other than breast milk from your nipples.  Your breasts do not become full before feedings by the fifth day after you give birth.  You feel sad and depressed.  Your baby is too sleepy to eat well.  Your baby is having trouble sleeping.   Your baby is wetting less than 3 diapers in a 24-hour period.  Your baby has less than 3 stools in a 24-hour period.  Your baby's skin or the white part of his or her eyes becomes yellow.   Your baby is not gaining weight by 5 days of age. SEEK IMMEDIATE MEDICAL CARE IF:   Your baby is overly tired (lethargic) and does not want to wake up and feed.  Your baby   develops an unexplained fever. Document Released: 07/25/2005 Document Revised: 07/30/2013 Document Reviewed: 01/16/2013 [employer] Patient Information 2015 [employer]. This information is not intended to replace advice given to you by your health care provider. Make sure you discuss any questions you have with your health care provider.  

## 2015-01-06 NOTE — Progress Notes (Signed)
  Subjective:    Chelsea HelperRebekah J Ferguson is a 19 y.o. G1P0 10240w5d being seen today for her obstetrical visit.  Patient reports no complaints. Fetal movement: normal.  Objective:    BP 125/83 mmHg  Pulse 83  Wt 187 lb (84.823 kg)  LMP 06/07/2014 (Approximate)  Physical Exam  Exam  FHT: Fetal Heart Rate (bpm): 159  Uterine Size:  31 cm  Abdomen:  soft, gravid, NT    Urine Pr/Gluc-neg/neg Assessment:    Pregnancy:  G1P0    Plan:    Patient Active Problem List   Diagnosis Date Noted  . Supervision of normal first pregnancy 07/28/2014    Priority: High  . Obesity affecting pregnancy, antepartum 07/28/2014    Priority: Medium  . Obesity (BMI 30.0-34.9) 07/28/2014  . Infertility associated with anovulation 07/28/2014    PTL precautions given  She is not currently interested in childbirth classes  Follow up in 2 Weeks.

## 2015-01-07 ENCOUNTER — Inpatient Hospital Stay (HOSPITAL_COMMUNITY)
Admission: AD | Admit: 2015-01-07 | Discharge: 2015-01-07 | Disposition: A | Discharge status: Home / Self Care | Payer: Medicaid Other | Source: Ambulatory Visit | Attending: Obstetrics & Gynecology | Admitting: Obstetrics & Gynecology

## 2015-01-07 ENCOUNTER — Encounter (HOSPITAL_COMMUNITY): Payer: Self-pay

## 2015-01-07 DIAGNOSIS — Z3A31 31 weeks gestation of pregnancy: Secondary | ICD-10-CM

## 2015-01-07 DIAGNOSIS — O479 False labor, unspecified: Secondary | ICD-10-CM

## 2015-01-07 DIAGNOSIS — O4703 False labor before 37 completed weeks of gestation, third trimester: Secondary | ICD-10-CM | POA: Diagnosis not present

## 2015-01-07 DIAGNOSIS — Z3A30 30 weeks gestation of pregnancy: Secondary | ICD-10-CM | POA: Diagnosis not present

## 2015-01-07 LAB — URINALYSIS, ROUTINE W REFLEX MICROSCOPIC
BILIRUBIN URINE: NEGATIVE
GLUCOSE, UA: NEGATIVE mg/dL
Hgb urine dipstick: NEGATIVE
Ketones, ur: NEGATIVE mg/dL
Leukocytes, UA: NEGATIVE
Nitrite: NEGATIVE
Protein, ur: NEGATIVE mg/dL
UROBILINOGEN UA: 0.2 mg/dL (ref 0.0–1.0)
pH: 6.5 (ref 5.0–8.0)

## 2015-01-07 MED ORDER — HYDROCORTISONE 2.5 % EX OINT: TOPICAL_OINTMENT | Freq: Two times a day (BID) | CUTANEOUS | Status: DC

## 2015-01-07 NOTE — MAU Note (Signed)
Contractions every 3-12 minutes since 5 pm today. Denies LOF or vaginal bleeding. States lost mucous plug yesterday. Positive fetal movement.

## 2015-01-07 NOTE — MAU Note (Signed)
Pt states contractions aren't painful, just can just feel the pressure of them.  Also complains of poison ivy rash on back, abdomen, and thighs. Rash appeared 4 days ago, and has been treating with OTC cortisone cream.

## 2015-01-07 NOTE — MAU Provider Note (Signed)
History   CSN: [id number]  Arrival date and time: 01/07/15 5784   First Provider Initiated Contact with Patient 01/07/15 2054     Chief Complaint  Patient presents with  . Contractions   HPI  Patient is 19 y.o. G1P0 [redacted]w[redacted]d here with complaints of contractions. Saw her OB provider yesterday who told her that if she has more than 4 contractions in an hr she needs to be evaluated. Patient states that starting around 5:30pm she started having frequent contractions. Had ~8x in an hr and ~13x in 2 hrs. Contractions do sometimes causer her to have sharp pelvic pain and pressure. +FM, denies LOF, VB, vaginal discharge.  Of note, patient has had diarrhea x5 today. She also endorses having poison oak for the last 4 days now. She got it from her husband who works outdoors. States it is not getting better with OTC cream she has been using.   OB History    Gravida Para Term Preterm AB TAB SAB Ectopic Multiple Living   1             -h/o PCOS -in vitro fertilization -prenatal care at [facility]  Past Medical History  Diagnosis Date  . IBS (irritable bowel syndrome)   . Infertility, female   . PCOS (polycystic ovarian syndrome)   . Headache     Past Surgical History  Procedure Laterality Date  . No past surgeries      Family History  Problem Relation Age of Onset  . Diabetes Maternal Grandmother   . Hypertension Maternal Grandmother   . Hypertension Paternal Grandmother     History  Substance Use Topics  . Smoking status: Never Smoker   . Smokeless tobacco: Never Used  . Alcohol Use: No    Allergies: No Known Allergies  Facility-administered medications prior to admission  Medication Dose Route Frequency Provider Last Rate Last Dose  . [DISCONTINUED] ferrous sulfate tablet 325 mg  325 mg Oral TID WC Ugonna A Anyanwu, MD       Prescriptions prior to admission  Medication Sig Dispense Refill Last Dose  . Doxylamine-Pyridoxine 10-10 MG TBEC Take 2 tablets by mouth at bedtime.  Take 2 tablets at bedtime. If symptoms persist, add 1 tab in the AM starting on day 3. If symptoms persist, add 1 tab in the PM starting day 4. 90 tablet 1 01/07/2015 at Unknown time  . ferrous sulfate 325 (65 FE) MG tablet Take 1 tablet (325 mg total) by mouth 3 (three) times daily with meals. 90 tablet 3 01/07/2015 at Unknown time  . hydrocortisone cream 0.5 % Apply 1 application topically 2 (two) times daily.   01/07/2015 at Unknown time  . Prenatal Vit-Fe Fumarate-FA (PRENATAL MULTIVITAMIN) TABS tablet Take 1 tablet by mouth daily at 12 noon.   01/07/2015 at Unknown time  . ranitidine (ZANTAC) 150 MG capsule Take 1 capsule (150 mg total) by mouth daily. 60 capsule 2 01/07/2015 at Unknown time    Review of Systems  Constitutional: Negative for fever and chills.  Eyes: Negative for blurred vision and double vision.  Respiratory: Negative for shortness of breath.   Cardiovascular: Positive for leg swelling. Negative for chest pain.  Gastrointestinal: Negative for nausea, vomiting and constipation.  Genitourinary: Negative for dysuria.  Neurological: Negative for dizziness and headaches.   Physical Exam   Blood pressure 138/82, pulse 101, temperature 98.5 F (36.9 C), temperature source Oral, resp. rate 18, height  (1.549 m), weight 187 lb (84.823 kg), last menstrual  period 06/07/2014.  Physical Exam  Constitutional: She is oriented to person, place, and time. She appears well-developed and well-nourished. No distress.  HENT:  Head: Normocephalic and atraumatic.  Eyes: EOM are normal.  Cardiovascular: Regular rhythm, normal heart sounds and intact distal pulses.  Tachycardia present.   Respiratory: Effort normal and breath sounds normal.  GI: Soft. Bowel sounds are normal. There is no tenderness.  Musculoskeletal: Normal range of motion. She exhibits no edema or tenderness.  Neurological: She is alert and oriented to person, place, and time.  Skin: Skin is warm and dry.  Psychiatric: Her  mood appears anxious.  SVE: 0.5/thick/-3/posterior  MAU Course  Procedures - None  MDM: - FHT reassuring; contractions noticed  - cervix not dilated or effaced.  - UA reviewed and wnl  Assessment and Plan  A: Patient is 19 y.o. G1P0 5845w6d reporting contactions likely secondary to Braxton-hicks contractions and uterine irritability. Also with poison oak.   P: - reassured patient these are not true contractions of labor - fetal kick counts reinforced - preterm labor precautions - handout given - Rx for higher potency cortisone ointment - follow-up with OB provider -Needs monitoring of BP as outpatient. Pressures borderline elevated most likely due to nervousness.  Chelsea AdaJazma Phelps, DO 01/07/2015, 9:42 PM PGY-1, [facility]  CNM attestation:  I have seen and examined this patient; I agree with above documentation in the resident's note.   Chelsea Ferguson is a 19 y.o. G1P0 reporting BH ctx +FM, denies LOF, VB, vaginal discharge.  PE: BP 142/80 mmHg  Pulse 92  Temp(Src) 98.5 F (36.9 C) (Oral)  Resp 18  Ht 5\' 1"  (1.549 m)  Wt 84.823 kg (187 lb)  BMI 35.35 kg/m2  LMP 06/07/2014 (Approximate) Additional BPs: 139/82 and 138/82 Gen: calm comfortable, NAD Resp: normal effort, no distress Abd: gravid  ROS, labs, PMH reviewed NST reactive for GA Toco: irreg irritability  Plan: - fetal kick counts reinforced, preterm labor precautions - rx for cortisone given - continue routine follow up in OB clinic  Wanette Robison, CNM 10:14 PM

## 2015-01-07 NOTE — Discharge Instructions (Signed)
Braxton Hicks Contractions °Contractions of the uterus can occur throughout pregnancy. Contractions are not always a sign that you are in labor.  °WHAT ARE BRAXTON HICKS CONTRACTIONS?  °Contractions that occur before labor are called Braxton Hicks contractions, or false labor. Toward the end of pregnancy (32-34 weeks), these contractions can develop more often and may become more forceful. This is not true labor because these contractions do not result in opening (dilatation) and thinning of the cervix. They are sometimes difficult to tell apart from true labor because these contractions can be forceful and people have different pain tolerances. You should not feel embarrassed if you go to the hospital with false labor. Sometimes, the only way to tell if you are in true labor is for your health care provider to look for changes in the cervix. °If there are no prenatal problems or other health problems associated with the pregnancy, it is completely safe to be sent home with false labor and await the onset of true labor. °HOW CAN YOU TELL THE DIFFERENCE BETWEEN TRUE AND FALSE LABOR? °False Labor °· The contractions of false labor are usually shorter and not as hard as those of true labor.   °· The contractions are usually irregular.   °· The contractions are often felt in the front of the lower abdomen and in the groin.   °· The contractions may go away when you walk around or change positions while lying down.   °· The contractions get weaker and are shorter lasting as time goes on.   °· The contractions do not usually become progressively stronger, regular, and closer together as with true labor.   °True Labor °· Contractions in true labor last 30-70 seconds, become very regular, usually become more intense, and increase in frequency.   °· The contractions do not go away with walking.   °· The discomfort is usually felt in the top of the uterus and spreads to the lower abdomen and low back.   °· True labor can be  determined by your health care provider with an exam. This will show that the cervix is dilating and getting thinner.   °WHAT TO REMEMBER °· Keep up with your usual exercises and follow other instructions given by your health care provider.   °· Take medicines as directed by your health care provider.   °· Keep your regular prenatal appointments.   °· Eat and drink lightly if you think you are going into labor.   °· If Braxton Hicks contractions are making you uncomfortable:   °¨ Change your position from lying down or resting to walking, or from walking to resting.   °¨ Sit and rest in a tub of warm water.   °¨ Drink 2-3 glasses of water. Dehydration may cause these contractions.   °¨ Do slow and deep breathing several times an hour.   °WHEN SHOULD I SEEK IMMEDIATE MEDICAL CARE? °Seek immediate medical care if: °· Your contractions become stronger, more regular, and closer together.   °· You have fluid leaking or gushing from your vagina.   °· You have a fever.   °· You pass blood-tinged mucus.   °· You have vaginal bleeding.   °· You have continuous abdominal pain.   °· You have low back pain that you never had before.   °· You feel your baby's head pushing down and causing pelvic pressure.   °· Your baby is not moving as much as it used to.   °Document Released: 07/25/2005 Document Revised: 07/30/2013 Document Reviewed: 05/06/2013 °Exit[employer]® Patient Information ©2015 [employer]. This information is not intended to replace advice given to you by your health care   provider. Make sure you discuss any questions you have with your health care provider. ° °

## 2015-01-08 ENCOUNTER — Encounter: Payer: Self-pay | Admitting: *Deleted

## 2015-01-08 ENCOUNTER — Telehealth: Payer: Self-pay | Admitting: *Deleted

## 2015-01-08 DIAGNOSIS — R112 Nausea with vomiting, unspecified: Secondary | ICD-10-CM

## 2015-01-08 MED ORDER — DOXYLAMINE-PYRIDOXINE 10-10 MG PO TBEC: 2.0000 | DELAYED_RELEASE_TABLET | Freq: Every day | ORAL | Status: DC

## 2015-01-08 NOTE — Telephone Encounter (Signed)
-----   Message from Reva Boresanya S Pratt, MD sent at 01/07/2015  4:38 PM EDT ----- Regarding: RE: poison oak Call in a medrol dose pack ----- Message -----    From: Grayland Ormondhrissie C Jahnai Slingerland, CMA    Sent: 01/07/2015   3:29 PM      To: Reva Boresanya S Pratt, MD [facility]                                     Patient called and said she has poison oak and it is continuing to spread and she has tried everything OTC and nothing is working.  Patient wanted know what else to do or if anything prescription could be sent in.

## 2015-01-08 NOTE — Telephone Encounter (Signed)
Pt called and needed refills on her Diclegis, refills sent in.

## 2015-01-08 NOTE — Telephone Encounter (Signed)
I have called in prescription and pt is aware.

## 2015-01-12 ENCOUNTER — Ambulatory Visit (INDEPENDENT_AMBULATORY_CARE_PROVIDER_SITE_OTHER): Payer: Medicaid Other | Admitting: Family Medicine

## 2015-01-12 VITALS — BP 116/78 | HR 83 | Wt 189.0 lb

## 2015-01-12 DIAGNOSIS — O4700 False labor before 37 completed weeks of gestation, unspecified trimester: Secondary | ICD-10-CM | POA: Insufficient documentation

## 2015-01-12 DIAGNOSIS — O4703 False labor before 37 completed weeks of gestation, third trimester: Secondary | ICD-10-CM

## 2015-01-12 DIAGNOSIS — Z3403 Encounter for supervision of normal first pregnancy, third trimester: Secondary | ICD-10-CM

## 2015-01-12 HISTORY — DX: False labor before 37 completed weeks of gestation, unspecified trimester: O47.00

## 2015-01-12 MED ORDER — NIFEDIPINE ER OSMOTIC RELEASE 30 MG PO TB24: 30.0000 mg | ORAL_TABLET | Freq: Every day | ORAL | Status: DC | PRN

## 2015-01-12 NOTE — Progress Notes (Signed)
Subjective:   Tera HelperRebekah J Wiechmann is a 19 y.o. G1P0 at 2357w4d being seen today for her obstetrical visit.  Patient reports contractions since several nights ago. Seen in MAU and had minimal cervical dilation.  Reports worsening pain over last night.  Improved today..   Contractions: Contractions: Irregular.   Vaginal Bleeding Vag. Bleeding: None.   Fetal Movement: Movement: Present.   The following portions of the patient's history were reviewed and updated as appropriate: allergies, current medications, past family history, past medical history, past social history, past surgical history and problem list.   Objective:  BP 116/78 mmHg  Pulse 83  Wt 189 lb (85.73 kg)  LMP 06/07/2014 (Approximate) Fetal Heart Rate: Fetal Heart Rate (bpm): 145  Fundal Height: Fundal Height: 32 cm  Fetal Movement: Movement: Present  Fetal Presentation:     Abdomen: Soft, gravid, appropriate for gestational age.  Pain/Pressure: Pain/Pressure: Present     Vaginal: Vaginal Bleeding Vag. Bleeding: None   Discharge: Vag D/C Character: Thin  Cervix: Dilation: Dilation: Fingertip Effacement: Effacement (%): Thick Station: Station: Ballotable  Extremities: Edema: Edema: None   Urinalysis: Protein: Urine Protein: Negative Glucose: Urine Glucose: Negative No results found for this or any previous visit (from the past 24 hour(s)).   Assessment and Plan:   Pregnancy:  G1P0 at 4257w4d  1. Threatened preterm labor, antepartum No cervical change. - NIFEdipine (PROCARDIA XL) 30 MG 24 hr tablet; Take 1 tablet (30 mg total) by mouth daily as needed (for contractions).  Dispense: 30 tablet; Refill: 2  2. Encounter for supervision of normal first pregnancy in third trimester Continue routine care   Preterm labor symptoms: vaginal bleeding, contractions and leaking of fluid reviewed in detail.  Fetal movement precautions reviewed.  Follow up in 2 weeks.   Reva Boresanya S Shahrzad Koble, MD

## 2015-01-12 NOTE — Patient Instructions (Signed)
Third Trimester of Pregnancy The third trimester is from week 29 through week 42, months 7 through 9. The third trimester is a time when the fetus is growing rapidly. At the end of the ninth month, the fetus is about 20 inches in length and weighs 6-10 pounds.  BODY CHANGES Your body goes through many changes during pregnancy. The changes vary from woman to woman.   Your weight will continue to increase. You can expect to gain 25-35 pounds (11-16 kg) by the end of the pregnancy.  You may begin to get stretch marks on your hips, abdomen, and breasts.  You may urinate more often because the fetus is moving lower into your pelvis and pressing on your bladder.  You may develop or continue to have heartburn as a result of your pregnancy.  You may develop constipation because certain hormones are causing the muscles that push waste through your intestines to slow down.  You may develop hemorrhoids or swollen, bulging veins (varicose veins).  You may have pelvic pain because of the weight gain and pregnancy hormones relaxing your joints between the bones in your pelvis. Backaches may result from overexertion of the muscles supporting your posture.  You may have changes in your hair. These can include thickening of your hair, rapid growth, and changes in texture. Some women also have hair loss during or after pregnancy, or hair that feels dry or thin. Your hair will most likely return to normal after your baby is born.  Your breasts will continue to grow and be tender. A yellow discharge may leak from your breasts called colostrum.  Your belly button may stick out.  You may feel short of breath because of your expanding uterus.  You may notice the fetus "dropping," or moving lower in your abdomen.  You may have a bloody mucus discharge. This usually occurs a few days to a week before labor begins.  Your cervix becomes thin and soft (effaced) near your due date. WHAT TO EXPECT AT YOUR  PRENATAL EXAMS  You will have prenatal exams every 2 weeks until week 36. Then, you will have weekly prenatal exams. During a routine prenatal visit:  You will be weighed to make sure you and the fetus are growing normally.  Your blood pressure is taken.  Your abdomen will be measured to track your baby's growth.  The fetal heartbeat will be listened to.  Any test results from the previous visit will be discussed.  You may have a cervical check near your due date to see if you have effaced. At around 36 weeks, your caregiver will check your cervix. At the same time, your caregiver will also perform a test on the secretions of the vaginal tissue. This test is to determine if a type of bacteria, Group B streptococcus, is present. Your caregiver will explain this further. Your caregiver may ask you:  What your birth plan is.  How you are feeling.  If you are feeling the baby move.  If you have had any abnormal symptoms, such as leaking fluid, bleeding, severe headaches, or abdominal cramping.  If you have any questions. Other tests or screenings that may be performed during your third trimester include:  Blood tests that check for low iron levels (anemia).  Fetal testing to check the health, activity level, and growth of the fetus. Testing is done if you have certain medical conditions or if there are problems during the pregnancy. FALSE LABOR You may feel small, irregular contractions that   eventually go away. These are called Braxton Hicks contractions, or false labor. Contractions may last for hours, days, or even weeks before true labor sets in. If contractions come at regular intervals, intensify, or become painful, it is best to be seen by your caregiver.  SIGNS OF LABOR   Menstrual-like cramps.  Contractions that are 5 minutes apart or less.  Contractions that start on the top of the uterus and spread down to the lower abdomen and back.  A sense of increased pelvic  pressure or back pain.  A watery or bloody mucus discharge that comes from the vagina. If you have any of these signs before the 37th week of pregnancy, call your caregiver right away. You need to go to the hospital to get checked immediately. HOME CARE INSTRUCTIONS   Avoid all smoking, herbs, alcohol, and unprescribed drugs. These chemicals affect the formation and growth of the baby.  Follow your caregiver's instructions regarding medicine use. There are medicines that are either safe or unsafe to take during pregnancy.  Exercise only as directed by your caregiver. Experiencing uterine cramps is a good sign to stop exercising.  Continue to eat regular, healthy meals.  Wear a good support bra for breast tenderness.  Do not use hot tubs, steam rooms, or saunas.  Wear your seat belt at all times when driving.  Avoid raw meat, uncooked cheese, cat litter boxes, and soil used by cats. These carry germs that can cause birth defects in the baby.  Take your prenatal vitamins.  Try taking a stool softener (if your caregiver approves) if you develop constipation. Eat more high-fiber foods, such as fresh vegetables or fruit and whole grains. Drink plenty of fluids to keep your urine clear or pale yellow.  Take warm sitz baths to soothe any pain or discomfort caused by hemorrhoids. Use hemorrhoid cream if your caregiver approves.  If you develop varicose veins, wear support hose. Elevate your feet for 15 minutes, 3-4 times a day. Limit salt in your diet.  Avoid heavy lifting, wear low heal shoes, and practice good posture.  Rest a lot with your legs elevated if you have leg cramps or low back pain.  Visit your dentist if you have not gone during your pregnancy. Use a soft toothbrush to brush your teeth and be gentle when you floss.  A sexual relationship may be continued unless your caregiver directs you otherwise.  Do not travel far distances unless it is absolutely necessary and only  with the approval of your caregiver.  Take prenatal classes to understand, practice, and ask questions about the labor and delivery.  Make a trial run to the hospital.  Pack your hospital bag.  Prepare the baby's nursery.  Continue to go to all your prenatal visits as directed by your caregiver. SEEK MEDICAL CARE IF:  You are unsure if you are in labor or if your water has broken.  You have dizziness.  You have mild pelvic cramps, pelvic pressure, or nagging pain in your abdominal area.  You have persistent nausea, vomiting, or diarrhea.  You have a bad smelling vaginal discharge.  You have pain with urination. SEEK IMMEDIATE MEDICAL CARE IF:   You have a fever.  You are leaking fluid from your vagina.  You have spotting or bleeding from your vagina.  You have severe abdominal cramping or pain.  You have rapid weight loss or gain.  You have shortness of breath with chest pain.  You notice sudden or extreme swelling   of your face, hands, ankles, feet, or legs.  You have not felt your baby move in over an hour.  You have severe headaches that do not go away with medicine.  You have vision changes. Document Released: 07/19/2001 Document Revised: 07/30/2013 Document Reviewed: 09/25/2012 [employer] Patient Information 2015 [employer]. This information is not intended to replace advice given to you by your health care provider. Make sure you discuss any questions you have with your health care provider.  Breastfeeding Deciding to breastfeed is one of the best choices you can make for you and your baby. A change in hormones during pregnancy causes your breast tissue to grow and increases the number and size of your milk ducts. These hormones also allow proteins, sugars, and fats from your blood supply to make breast milk in your milk-producing glands. Hormones prevent breast milk from being released before your baby is born as well as prompt milk flow after birth. Once  breastfeeding has begun, thoughts of your baby, as well as his or her sucking or crying, can stimulate the release of milk from your milk-producing glands.  BENEFITS OF BREASTFEEDING For Your Baby  Your first milk (colostrum) helps your baby's digestive system function better.   There are antibodies in your milk that help your baby fight off infections.   Your baby has a lower incidence of asthma, allergies, and sudden infant death syndrome.   The nutrients in breast milk are better for your baby than infant formulas and are designed uniquely for your baby's needs.   Breast milk improves your baby's brain development.   Your baby is less likely to develop other conditions, such as childhood obesity, asthma, or type 2 diabetes mellitus.  For You   Breastfeeding helps to create a very special bond between you and your baby.   Breastfeeding is convenient. Breast milk is always available at the correct temperature and costs nothing.   Breastfeeding helps to burn calories and helps you lose the weight gained during pregnancy.   Breastfeeding makes your uterus contract to its prepregnancy size faster and slows bleeding (lochia) after you give birth.   Breastfeeding helps to lower your risk of developing type 2 diabetes mellitus, osteoporosis, and breast or ovarian cancer later in life. SIGNS THAT YOUR BABY IS HUNGRY Early Signs of Hunger  Increased alertness or activity.  Stretching.  Movement of the head from side to side.  Movement of the head and opening of the mouth when the corner of the mouth or cheek is stroked (rooting).  Increased sucking sounds, smacking lips, cooing, sighing, or squeaking.  Hand-to-mouth movements.  Increased sucking of fingers or hands. Late Signs of Hunger  Fussing.  Intermittent crying. Extreme Signs of Hunger Signs of extreme hunger will require calming and consoling before your baby will be able to breastfeed successfully. Do not  wait for the following signs of extreme hunger to occur before you initiate breastfeeding:   Restlessness.  A loud, strong cry.   Screaming. BREASTFEEDING BASICS Breastfeeding Initiation  Find a comfortable place to sit or lie down, with your neck and back well supported.  Place a pillow or rolled up blanket under your baby to bring him or her to the level of your breast (if you are seated). Nursing pillows are specially designed to help support your arms and your baby while you breastfeed.  Make sure that your baby's abdomen is facing your abdomen.   Gently massage your breast. With your fingertips, massage from your chest   wall toward your nipple in a circular motion. This encourages milk flow. You may need to continue this action during the feeding if your milk flows slowly.  Support your breast with 4 fingers underneath and your thumb above your nipple. Make sure your fingers are well away from your nipple and your baby's mouth.   Stroke your baby's lips gently with your finger or nipple.   When your baby's mouth is open wide enough, quickly bring your baby to your breast, placing your entire nipple and as much of the colored area around your nipple (areola) as possible into your baby's mouth.   More areola should be visible above your baby's upper lip than below the lower lip.   Your baby's tongue should be between his or her lower gum and your breast.   Ensure that your baby's mouth is correctly positioned around your nipple (latched). Your baby's lips should create a seal on your breast and be turned out (everted).  It is common for your baby to suck about 2-3 minutes in order to start the flow of breast milk. Latching Teaching your baby how to latch on to your breast properly is very important. An improper latch can cause nipple pain and decreased milk supply for you and poor weight gain in your baby. Also, if your baby is not latched onto your nipple properly, he or she  may swallow some air during feeding. This can make your baby fussy. Burping your baby when you switch breasts during the feeding can help to get rid of the air. However, teaching your baby to latch on properly is still the best way to prevent fussiness from swallowing air while breastfeeding. Signs that your baby has successfully latched on to your nipple:    Silent tugging or silent sucking, without causing you pain.   Swallowing heard between every 3-4 sucks.    Muscle movement above and in front of his or her ears while sucking.  Signs that your baby has not successfully latched on to nipple:   Sucking sounds or smacking sounds from your baby while breastfeeding.  Nipple pain. If you think your baby has not latched on correctly, slip your finger into the corner of your baby's mouth to break the suction and place it between your baby's gums. Attempt breastfeeding initiation again. Signs of Successful Breastfeeding Signs from your baby:   A gradual decrease in the number of sucks or complete cessation of sucking.   Falling asleep.   Relaxation of his or her body.   Retention of a small amount of milk in his or her mouth.   Letting go of your breast by himself or herself. Signs from you:  Breasts that have increased in firmness, weight, and size 1-3 hours after feeding.   Breasts that are softer immediately after breastfeeding.  Increased milk volume, as well as a change in milk consistency and color by the fifth day of breastfeeding.   Nipples that are not sore, cracked, or bleeding. Signs That Your Baby is Getting Enough Milk  Wetting at least 3 diapers in a 24-hour period. The urine should be clear and pale yellow by age 5 days.  At least 3 stools in a 24-hour period by age 5 days. The stool should be soft and yellow.  At least 3 stools in a 24-hour period by age 7 days. The stool should be seedy and yellow.  No loss of weight greater than 10% of birth weight  during the first 3   days of age.  Average weight gain of 4-7 ounces (113-198 g) per week after age 4 days.  Consistent daily weight gain by age 5 days, without weight loss after the age of 2 weeks. After a feeding, your baby may spit up a small amount. This is common. BREASTFEEDING FREQUENCY AND DURATION Frequent feeding will help you make more milk and can prevent sore nipples and breast engorgement. Breastfeed when you feel the need to reduce the fullness of your breasts or when your baby shows signs of hunger. This is called "breastfeeding on demand." Avoid introducing a pacifier to your baby while you are working to establish breastfeeding (the first 4-6 weeks after your baby is born). After this time you may choose to use a pacifier. Research has shown that pacifier use during the first year of a baby's life decreases the risk of sudden infant death syndrome (SIDS). Allow your baby to feed on each breast as long as he or she wants. Breastfeed until your baby is finished feeding. When your baby unlatches or falls asleep while feeding from the first breast, offer the second breast. Because newborns are often sleepy in the first few weeks of life, you may need to awaken your baby to get him or her to feed. Breastfeeding times will vary from baby to baby. However, the following rules can serve as a guide to help you ensure that your baby is properly fed:  Newborns (babies 4 weeks of age or younger) may breastfeed every 1-3 hours.  Newborns should not go longer than 3 hours during the day or 5 hours during the night without breastfeeding.  You should breastfeed your baby a minimum of 8 times in a 24-hour period until you begin to introduce solid foods to your baby at around 6 months of age. BREAST MILK PUMPING Pumping and storing breast milk allows you to ensure that your baby is exclusively fed your breast milk, even at times when you are unable to breastfeed. This is especially important if you are  going back to work while you are still breastfeeding or when you are not able to be present during feedings. Your lactation consultant can give you guidelines on how long it is safe to store breast milk.  A breast pump is a machine that allows you to pump milk from your breast into a sterile bottle. The pumped breast milk can then be stored in a refrigerator or freezer. Some breast pumps are operated by hand, while others use electricity. Ask your lactation consultant which type will work best for you. Breast pumps can be purchased, but some hospitals and breastfeeding support groups lease breast pumps on a monthly basis. A lactation consultant can teach you how to hand express breast milk, if you prefer not to use a pump.  CARING FOR YOUR BREASTS WHILE YOU BREASTFEED Nipples can become dry, cracked, and sore while breastfeeding. The following recommendations can help keep your breasts moisturized and healthy:  Avoid using soap on your nipples.   Wear a supportive bra. Although not required, special nursing bras and tank tops are designed to allow access to your breasts for breastfeeding without taking off your entire bra or top. Avoid wearing underwire-style bras or extremely tight bras.  Air dry your nipples for 3-4minutes after each feeding.   Use only cotton bra pads to absorb leaked breast milk. Leaking of breast milk between feedings is normal.   Use lanolin on your nipples after breastfeeding. Lanolin helps to maintain your skin's   normal moisture barrier. If you use pure lanolin, you do not need to wash it off before feeding your baby again. Pure lanolin is not toxic to your baby. You may also hand express a few drops of breast milk and gently massage that milk into your nipples and allow the milk to air dry. In the first few weeks after giving birth, some women experience extremely full breasts (engorgement). Engorgement can make your breasts feel heavy, warm, and tender to the touch.  Engorgement peaks within 3-5 days after you give birth. The following recommendations can help ease engorgement:  Completely empty your breasts while breastfeeding or pumping. You may want to start by applying warm, moist heat (in the shower or with warm water-soaked hand towels) just before feeding or pumping. This increases circulation and helps the milk flow. If your baby does not completely empty your breasts while breastfeeding, pump any extra milk after he or she is finished.  Wear a snug bra (nursing or regular) or tank top for 1-2 days to signal your body to slightly decrease milk production.  Apply ice packs to your breasts, unless this is too uncomfortable for you.  Make sure that your baby is latched on and positioned properly while breastfeeding. If engorgement persists after 48 hours of following these recommendations, contact your health care provider or a lactation consultant. OVERALL HEALTH CARE RECOMMENDATIONS WHILE BREASTFEEDING  Eat healthy foods. Alternate between meals and snacks, eating 3 of each per day. Because what you eat affects your breast milk, some of the foods may make your baby more irritable than usual. Avoid eating these foods if you are sure that they are negatively affecting your baby.  Drink milk, fruit juice, and water to satisfy your thirst (about 10 glasses a day).   Rest often, relax, and continue to take your prenatal vitamins to prevent fatigue, stress, and anemia.  Continue breast self-awareness checks.  Avoid chewing and smoking tobacco.  Avoid alcohol and drug use. Some medicines that may be harmful to your baby can pass through breast milk. It is important to ask your health care provider before taking any medicine, including all over-the-counter and prescription medicine as well as vitamin and herbal supplements. It is possible to become pregnant while breastfeeding. If birth control is desired, ask your health care provider about options that  will be safe for your baby. SEEK MEDICAL CARE IF:   You feel like you want to stop breastfeeding or have become frustrated with breastfeeding.  You have painful breasts or nipples.  Your nipples are cracked or bleeding.  Your breasts are red, tender, or warm.  You have a swollen area on either breast.  You have a fever or chills.  You have nausea or vomiting.  You have drainage other than breast milk from your nipples.  Your breasts do not become full before feedings by the fifth day after you give birth.  You feel sad and depressed.  Your baby is too sleepy to eat well.  Your baby is having trouble sleeping.   Your baby is wetting less than 3 diapers in a 24-hour period.  Your baby has less than 3 stools in a 24-hour period.  Your baby's skin or the white part of his or her eyes becomes yellow.   Your baby is not gaining weight by 5 days of age. SEEK IMMEDIATE MEDICAL CARE IF:   Your baby is overly tired (lethargic) and does not want to wake up and feed.  Your baby   develops an unexplained fever. Document Released: 07/25/2005 Document Revised: 07/30/2013 Document Reviewed: 01/16/2013 [employer] Patient Information 2015 [employer]. This information is not intended to replace advice given to you by your health care provider. Make sure you discuss any questions you have with your health care provider.  

## 2015-01-20 ENCOUNTER — Encounter (HOSPITAL_COMMUNITY): Payer: Self-pay | Admitting: *Deleted

## 2015-01-20 ENCOUNTER — Encounter: Payer: Medicaid Other | Admitting: Family Medicine

## 2015-01-20 ENCOUNTER — Inpatient Hospital Stay (HOSPITAL_COMMUNITY)
Admission: AD | Admit: 2015-01-20 | Discharge: 2015-01-21 | Disposition: A | Discharge status: Home / Self Care | Payer: Medicaid Other | Source: Ambulatory Visit | Attending: Family Medicine | Admitting: Family Medicine

## 2015-01-20 DIAGNOSIS — R51 Headache: Secondary | ICD-10-CM | POA: Diagnosis not present

## 2015-01-20 DIAGNOSIS — R030 Elevated blood-pressure reading, without diagnosis of hypertension: Secondary | ICD-10-CM | POA: Diagnosis present

## 2015-01-20 DIAGNOSIS — O133 Gestational [pregnancy-induced] hypertension without significant proteinuria, third trimester: Secondary | ICD-10-CM | POA: Diagnosis not present

## 2015-01-20 DIAGNOSIS — Z3A32 32 weeks gestation of pregnancy: Secondary | ICD-10-CM | POA: Insufficient documentation

## 2015-01-20 DIAGNOSIS — O4700 False labor before 37 completed weeks of gestation, unspecified trimester: Secondary | ICD-10-CM

## 2015-01-20 DIAGNOSIS — Z3403 Encounter for supervision of normal first pregnancy, third trimester: Secondary | ICD-10-CM

## 2015-01-20 DIAGNOSIS — O99212 Obesity complicating pregnancy, second trimester: Secondary | ICD-10-CM

## 2015-01-20 LAB — URINALYSIS, ROUTINE W REFLEX MICROSCOPIC
Bilirubin Urine: NEGATIVE
GLUCOSE, UA: NEGATIVE mg/dL
Hgb urine dipstick: NEGATIVE
Ketones, ur: NEGATIVE mg/dL
LEUKOCYTES UA: NEGATIVE
NITRITE: NEGATIVE
PROTEIN: NEGATIVE mg/dL
Specific Gravity, Urine: <1.005 — ABNORMAL LOW (ref 1.005–1.030)
UROBILINOGEN UA: 0.2 mg/dL (ref 0.0–1.0)
pH: 6 (ref 5.0–8.0)

## 2015-01-20 NOTE — MAU Note (Signed)
PT  SAYS  HER FEET  ARE SWOLLEN-  HAS BEEN  -  BUT WORSE  TODAY.   WENT  TO FD - CHECK  BP-  160/90-  AT  830PM.     H/A  STARTED   AT8PM-    NO MEDS-  WORSE  NOW  THAN  AT  8PM.    NO BLURRED  VISION.  GETS  PNC   WITH  [facility]  [facility]-   LAST  SEEN ON   Tuesday-  BP  OK   THEN.     DENIES HSV  AND  MRSA.  VE  HERE   2 WEEKS  AGO    FT.

## 2015-01-21 ENCOUNTER — Encounter (HOSPITAL_COMMUNITY): Payer: Self-pay

## 2015-01-21 LAB — CBC
HCT: 28.8 % — ABNORMAL LOW (ref 36.0–46.0)
Hemoglobin: 9.6 g/dL — ABNORMAL LOW (ref 12.0–15.0)
MCH: 27 pg (ref 26.0–34.0)
MCHC: 33.3 g/dL (ref 30.0–36.0)
MCV: 81.1 fL (ref 78.0–100.0)
Platelets: 250 10*3/uL (ref 150–400)
RBC: 3.55 MIL/uL — ABNORMAL LOW (ref 3.87–5.11)
RDW: 14.9 % (ref 11.5–15.5)
WBC: 11 10*3/uL — ABNORMAL HIGH (ref 4.0–10.5)

## 2015-01-21 LAB — COMPREHENSIVE METABOLIC PANEL
ALK PHOS: 113 U/L (ref 38–126)
ALT: 7 U/L — AB (ref 14–54)
AST: 21 U/L (ref 15–41)
Albumin: 2.7 g/dL — ABNORMAL LOW (ref 3.5–5.0)
Anion gap: 8 (ref 5–15)
BILIRUBIN TOTAL: 0.1 mg/dL — AB (ref 0.3–1.2)
BUN: <5 mg/dL — ABNORMAL LOW (ref 6–20)
CHLORIDE: 107 mmol/L (ref 101–111)
CO2: 23 mmol/L (ref 22–32)
Calcium: 8.8 mg/dL — ABNORMAL LOW (ref 8.9–10.3)
Creatinine, Ser: 0.7 mg/dL (ref 0.44–1.00)
GFR calc Af Amer: >60 mL/min (ref >60)
Glucose, Bld: 97 mg/dL (ref 65–99)
Potassium: 2.9 mmol/L — ABNORMAL LOW (ref 3.5–5.1)
SODIUM: 138 mmol/L (ref 135–145)
Total Protein: 6.4 g/dL — ABNORMAL LOW (ref 6.5–8.1)

## 2015-01-21 LAB — PROTEIN / CREATININE RATIO, URINE
Creatinine, Urine: 25 mg/dL
Total Protein, Urine: <6 mg/dL

## 2015-01-21 MED ORDER — ACETAMINOPHEN 500 MG PO TABS
1000.0000 mg | ORAL_TABLET | Freq: Once | ORAL | Status: AC
  Administered 2015-01-21: 1000 mg via ORAL
  Filled 2015-01-21: qty 2

## 2015-01-21 NOTE — MAU Provider Note (Signed)
History     CSN: [id number]  Arrival date and time: 01/20/15 2309   First Provider Initiated Contact with Patient 01/21/15 0015      Chief Complaint  Patient presents with  . Hypertension   HPI Patient is 19 y.o. G1P0 [redacted]w[redacted]d here with complaints of high blood pressure and swelling. She reports she checked her BP tonight and it was elevated at 160/90. She notes that she has had significantly increased welling in bilateral LE over the last day, much worse than it has been. She endorses a mild headache. She has felt mildly nauseated but no vomiting. Denies any visual changes or RUQ pain. No history of issues with blood pressure. Pregnancy has been uncomplicated thus far.   +FM, denies LOF, VB, contractions, vaginal discharge.    OB History    Gravida Para Term Preterm AB TAB SAB Ectopic Multiple Living   1               Past Medical History  Diagnosis Date  . IBS (irritable bowel syndrome)   . Infertility, female   . PCOS (polycystic ovarian syndrome)   . Headache     Past Surgical History  Procedure Laterality Date  . No past surgeries      Family History  Problem Relation Age of Onset  . Diabetes Maternal Grandmother   . Hypertension Maternal Grandmother   . Hypertension Paternal Grandmother     History  Substance Use Topics  . Smoking status: Never Smoker   . Smokeless tobacco: Never Used  . Alcohol Use: No    Allergies: No Known Allergies  Prescriptions prior to admission  Medication Sig Dispense Refill Last Dose  . Doxylamine-Pyridoxine 10-10 MG TBEC Take 2 tablets by mouth at bedtime. Take 2 tablets at bedtime. If symptoms persist, add 1 tab in the AM starting on day 3. If symptoms persist, add 1 tab in the PM starting day 4. 90 tablet 1 01/19/2015 at Unknown time  . ferrous sulfate 325 (65 FE) MG tablet Take 1 tablet (325 mg total) by mouth 3 (three) times daily with meals. (Patient taking differently: Take 325 mg by mouth daily with breakfast. ) 90 tablet  3 01/20/2015 at Unknown time  . Prenatal Vit-Fe Fumarate-FA (PRENATAL MULTIVITAMIN) TABS tablet Take 1 tablet by mouth daily at 12 noon.   01/20/2015 at Unknown time  . ranitidine (ZANTAC) 150 MG capsule Take 1 capsule (150 mg total) by mouth daily. 60 capsule 2 01/20/2015 at Unknown time  . hydrocortisone 2.5 % ointment Apply topically 2 (two) times daily. 30 g 0 Taking  . NIFEdipine (PROCARDIA XL) 30 MG 24 hr tablet Take 1 tablet (30 mg total) by mouth daily as needed (for contractions). 30 tablet 2     Review of Systems  Constitutional: Negative for fever and chills.  Eyes: Negative for blurred vision and double vision.  Respiratory: Negative for shortness of breath.   Cardiovascular: Positive for leg swelling. Negative for chest pain.  Gastrointestinal: Positive for nausea and diarrhea. Negative for heartburn, vomiting and constipation.  Genitourinary: Negative for dysuria and urgency.       No contractions, vaginal bleeding, itching or discharge  Skin: Negative for itching and rash.  Neurological: Positive for headaches. Negative for dizziness.   Physical Exam   Blood pressure 145/93, pulse 78, temperature 97.8 F (36.6 C), temperature source Oral, resp. rate 18, height  (1.549 m), weight 87.204 kg (192 lb 4 oz), last menstrual period 06/07/2014.  Physical Exam  Constitutional: She is oriented to person, place, and time. She appears well-developed and well-nourished.  HENT:  Head: Normocephalic and atraumatic.  Eyes: Conjunctivae are normal.  Neck: Normal range of motion.  Cardiovascular: Normal rate, regular rhythm, normal heart sounds and intact distal pulses.   No murmur heard. Respiratory: Effort normal and breath sounds normal. No respiratory distress. She has no wheezes.  GI: Soft. Bowel sounds are normal. She exhibits no distension. There is no tenderness.  Musculoskeletal: Normal range of motion.  2+ pitting edema below the knee in bilateral LE  Neurological: She is  alert and oriented to person, place, and time. She has normal reflexes.  Skin: Skin is warm and dry.  Psychiatric: She has a normal mood and affect.   FHT: cat I, baseline 145, + accels, - decels Toco: irregular contractions, patient reports not feeling contractions  MAU Course  Procedures  MDM 19 y.o. G1P0 at [redacted]w[redacted]d presenting with elevated BP and lower extremity swelling x 1 day. She has mild headache and nausea but no other associated symptoms. Will get pre-eclampsia labs and monitor repeated blood pressures.   Assessment and Plan  Patient is 19 y.o. G1P0 [redacted]w[redacted]d reporting elevated blood pressures and LE swelling. BP in evaluation ranged 143-145/90-94. Laboratory evaluation normal with no proteinuria. Patient was given tylenol with improvement in her headache. Not consistent with preeclampsia at this time. Patient is discharged home with instructions to follow up in the office for BP check in 1-2 days.   Gestational hypertension without proteinuria.  - preeclampsia warning signs given - fetal kick counts reinforced - preterm labor precautions  Patient history, exam, assessment and plan discussed with Illene Bolus, CNM  Fabio Asa 01/21/2015, 12:18 AM

## 2015-01-21 NOTE — Discharge Instructions (Signed)

## 2015-01-26 ENCOUNTER — Inpatient Hospital Stay (HOSPITAL_COMMUNITY)
Admission: AD | Admit: 2015-01-26 | Discharge: 2015-01-28 | DRG: 782 | Disposition: A | Discharge status: Home / Self Care | Payer: Medicaid Other | Source: Ambulatory Visit | Attending: Obstetrics and Gynecology | Admitting: Obstetrics and Gynecology

## 2015-01-26 ENCOUNTER — Encounter (HOSPITAL_COMMUNITY): Payer: Self-pay | Admitting: *Deleted

## 2015-01-26 ENCOUNTER — Ambulatory Visit (INDEPENDENT_AMBULATORY_CARE_PROVIDER_SITE_OTHER): Payer: Medicaid Other | Admitting: Obstetrics & Gynecology

## 2015-01-26 ENCOUNTER — Observation Stay (HOSPITAL_COMMUNITY): Payer: Medicaid Other

## 2015-01-26 VITALS — BP 155/93 | HR 75 | Wt 191.0 lb

## 2015-01-26 DIAGNOSIS — O133 Gestational [pregnancy-induced] hypertension without significant proteinuria, third trimester: Secondary | ICD-10-CM

## 2015-01-26 DIAGNOSIS — Z6830 Body mass index (BMI) 30.0-30.9, adult: Secondary | ICD-10-CM

## 2015-01-26 DIAGNOSIS — O163 Unspecified maternal hypertension, third trimester: Secondary | ICD-10-CM

## 2015-01-26 DIAGNOSIS — Z3403 Encounter for supervision of normal first pregnancy, third trimester: Secondary | ICD-10-CM

## 2015-01-26 DIAGNOSIS — Z833 Family history of diabetes mellitus: Secondary | ICD-10-CM

## 2015-01-26 DIAGNOSIS — O1490 Unspecified pre-eclampsia, unspecified trimester: Secondary | ICD-10-CM

## 2015-01-26 DIAGNOSIS — O4700 False labor before 37 completed weeks of gestation, unspecified trimester: Secondary | ICD-10-CM

## 2015-01-26 DIAGNOSIS — Z3A33 33 weeks gestation of pregnancy: Secondary | ICD-10-CM | POA: Insufficient documentation

## 2015-01-26 DIAGNOSIS — Z3A26 26 weeks gestation of pregnancy: Secondary | ICD-10-CM | POA: Diagnosis present

## 2015-01-26 DIAGNOSIS — O99212 Obesity complicating pregnancy, second trimester: Secondary | ICD-10-CM

## 2015-01-26 DIAGNOSIS — O132 Gestational [pregnancy-induced] hypertension without significant proteinuria, second trimester: Principal | ICD-10-CM | POA: Diagnosis present

## 2015-01-26 DIAGNOSIS — E669 Obesity, unspecified: Secondary | ICD-10-CM | POA: Diagnosis present

## 2015-01-26 DIAGNOSIS — Z3400 Encounter for supervision of normal first pregnancy, unspecified trimester: Secondary | ICD-10-CM

## 2015-01-26 DIAGNOSIS — O139 Gestational [pregnancy-induced] hypertension without significant proteinuria, unspecified trimester: Secondary | ICD-10-CM | POA: Diagnosis present

## 2015-01-26 DIAGNOSIS — E66811 Obesity, class 1: Secondary | ICD-10-CM | POA: Diagnosis present

## 2015-01-26 DIAGNOSIS — Z8249 Family history of ischemic heart disease and other diseases of the circulatory system: Secondary | ICD-10-CM

## 2015-01-26 LAB — CBC
HEMATOCRIT: 29.2 % — AB (ref 36.0–46.0)
Hemoglobin: 9.9 g/dL — ABNORMAL LOW (ref 12.0–15.0)
MCH: 27.4 pg (ref 26.0–34.0)
MCHC: 33.9 g/dL (ref 30.0–36.0)
MCV: 80.9 fL (ref 78.0–100.0)
PLATELETS: 247 10*3/uL (ref 150–400)
RBC: 3.61 MIL/uL — AB (ref 3.87–5.11)
RDW: 14.9 % (ref 11.5–15.5)
WBC: 9.4 10*3/uL (ref 4.0–10.5)

## 2015-01-26 LAB — COMPREHENSIVE METABOLIC PANEL
ALBUMIN: 2.6 g/dL — AB (ref 3.5–5.0)
ALT: 11 U/L — ABNORMAL LOW (ref 14–54)
AST: 57 U/L — AB (ref 15–41)
Alkaline Phosphatase: 140 U/L — ABNORMAL HIGH (ref 38–126)
Anion gap: 7 (ref 5–15)
BILIRUBIN TOTAL: 0.5 mg/dL (ref 0.3–1.2)
BUN: <5 mg/dL — ABNORMAL LOW (ref 6–20)
CHLORIDE: 108 mmol/L (ref 101–111)
CO2: 23 mmol/L (ref 22–32)
CREATININE: 0.87 mg/dL (ref 0.44–1.00)
Calcium: 8.7 mg/dL — ABNORMAL LOW (ref 8.9–10.3)
GFR calc Af Amer: >60 mL/min (ref >60)
GFR calc non Af Amer: >60 mL/min (ref >60)
Glucose, Bld: 124 mg/dL — ABNORMAL HIGH (ref 65–99)
Potassium: 3 mmol/L — ABNORMAL LOW (ref 3.5–5.1)
Sodium: 138 mmol/L (ref 135–145)
Total Protein: 6.3 g/dL — ABNORMAL LOW (ref 6.5–8.1)

## 2015-01-26 LAB — TYPE AND SCREEN
ABO/RH(D): O POS
ANTIBODY SCREEN: NEGATIVE

## 2015-01-26 LAB — PROTEIN / CREATININE RATIO, URINE
Creatinine, Urine: 29 mg/dL
Total Protein, Urine: <6 mg/dL

## 2015-01-26 MED ORDER — BETAMETHASONE SOD PHOS & ACET 6 (3-3) MG/ML IJ SUSP
12.0000 mg | INTRAMUSCULAR | Status: AC
  Administered 2015-01-26 – 2015-01-27 (×2): 12 mg via INTRAMUSCULAR
  Filled 2015-01-26 (×2): qty 2

## 2015-01-26 MED ORDER — ZOLPIDEM TARTRATE 5 MG PO TABS: 5.0000 mg | ORAL_TABLET | Freq: Every evening | ORAL | Status: DC | PRN

## 2015-01-26 MED ORDER — DOXYLAMINE SUCCINATE (SLEEP) 25 MG PO TABS
25.0000 mg | ORAL_TABLET | Freq: Three times a day (TID) | ORAL | Status: DC | PRN
  Administered 2015-01-26: 25 mg via ORAL
  Filled 2015-01-26 (×2): qty 1

## 2015-01-26 MED ORDER — DIPHENHYDRAMINE HCL 25 MG PO CAPS
50.0000 mg | ORAL_CAPSULE | Freq: Every evening | ORAL | Status: DC | PRN
  Administered 2015-01-27: 50 mg via ORAL
  Filled 2015-01-26: qty 2

## 2015-01-26 MED ORDER — DIPHENHYDRAMINE-APAP (SLEEP) 25-500 MG PO TABS: 2.0000 | ORAL_TABLET | Freq: Every evening | ORAL | Status: DC | PRN

## 2015-01-26 MED ORDER — FAMOTIDINE 20 MG PO TABS: 10.0000 mg | ORAL_TABLET | Freq: Every day | ORAL | Status: DC

## 2015-01-26 MED ORDER — FERROUS SULFATE 325 (65 FE) MG PO TABS
325.0000 mg | ORAL_TABLET | Freq: Every day | ORAL | Status: DC
  Administered 2015-01-27 – 2015-01-28 (×2): 325 mg via ORAL
  Filled 2015-01-26 (×2): qty 1

## 2015-01-26 MED ORDER — PRENATAL MULTIVITAMIN CH
1.0000 | ORAL_TABLET | Freq: Every day | ORAL | Status: DC
  Administered 2015-01-28: 1 via ORAL
  Filled 2015-01-26: qty 1

## 2015-01-26 MED ORDER — ACETAMINOPHEN 500 MG PO TABS: 1000.0000 mg | ORAL_TABLET | Freq: Every evening | ORAL | Status: DC | PRN

## 2015-01-26 MED ORDER — ONDANSETRON HCL 4 MG/2ML IJ SOLN: 4.0000 mg | Freq: Three times a day (TID) | INTRAMUSCULAR | Status: DC | PRN

## 2015-01-26 MED ORDER — ACETAMINOPHEN 325 MG PO TABS
650.0000 mg | ORAL_TABLET | ORAL | Status: DC | PRN
  Administered 2015-01-26 – 2015-01-28 (×3): 650 mg via ORAL
  Filled 2015-01-26 (×3): qty 2

## 2015-01-26 MED ORDER — DOXYLAMINE-PYRIDOXINE 10-10 MG PO TBEC: 2.0000 | DELAYED_RELEASE_TABLET | Freq: Every day | ORAL | Status: DC

## 2015-01-26 MED ORDER — DOCUSATE SODIUM 100 MG PO CAPS
100.0000 mg | ORAL_CAPSULE | Freq: Every day | ORAL | Status: DC
  Filled 2015-01-26 (×2): qty 1

## 2015-01-26 MED ORDER — PRENATAL MULTIVITAMIN CH: 1.0000 | ORAL_TABLET | Freq: Every day | ORAL | Status: DC

## 2015-01-26 MED ORDER — CALCIUM CARBONATE ANTACID 500 MG PO CHEW
2.0000 | CHEWABLE_TABLET | ORAL | Status: DC | PRN
  Filled 2015-01-26: qty 2

## 2015-01-26 MED ORDER — FAMOTIDINE 20 MG PO TABS
10.0000 mg | ORAL_TABLET | Freq: Every day | ORAL | Status: DC
  Administered 2015-01-27 – 2015-01-28 (×2): 10 mg via ORAL
  Filled 2015-01-26 (×2): qty 1

## 2015-01-26 NOTE — Progress Notes (Signed)
She has felt "off" all day. She awoke in the middle of the night with bad epigastric pain but couldn't wake her husband to take her to the hospital. She has had a headache all day today. She denies visual changes. DTR 2+. She will go to the MAU for evaluation.

## 2015-01-26 NOTE — Progress Notes (Signed)
Pt c/o epigastric pain 8/10 pain scale last night but better today.

## 2015-01-26 NOTE — MAU Note (Signed)
Urine in lab 

## 2015-01-26 NOTE — MAU Note (Signed)
Pt c/o headache for the past two days and has a mild one now but says it doesn't require any pain medication.   Swelling noted in her feet and ankles.  Denies any blurred vision, spots or flashes of light.  Plus 1 reflex and 2 beats of clonus noted in MAU today.

## 2015-01-26 NOTE — H&P (Signed)
FACULTY PRACTICE ANTEPARTUM ADMISSION HISTORY AND PHYSICAL NOTE   History of Present Illness: Chelsea Ferguson is a 19 y.o. G1P0 at [redacted]w[redacted]d admitted for monitoring of elevated blood pressures and rule our pre-eclampsia.    Pt was seen in MAU on 6/15 for worsening leg swelling and elevated BP with HA. In MAU, BPs were elevated, ranging from 143-145/90-94. Laboratory evaluation normal with no proteinuria. Patient was given tylenol with improvement in her headache. She was discharged home with instructions to follow up in clinic on 6/20.  She reports HA present for the past 2 days, mild (2/10 pain). Denies scotoma, SOB. She does endorse feeling "sick and bad" with worsening epigastric pain and nausea. She states she has had no appetite for 2 days.  In clinic noted to have elevated BP in 150s/100 and thus was instructed to present to MAU for further eval.  Patient reports the fetal movement as active. Patient reports uterine contraction  activity as none. Patient reports  vaginal bleeding as none. Patient describes fluid per vagina as None. Fetal presentation is unsure.  Patient Active Problem List   Diagnosis Date Noted  . Gestational hypertension 01/26/2015  . Threatened preterm labor, antepartum 01/12/2015  . Supervision of normal first pregnancy 07/28/2014  . Obesity (BMI 30.0-34.9) 07/28/2014  . Obesity affecting pregnancy, antepartum 07/28/2014  . Infertility associated with anovulation 07/28/2014     Past Medical History  Diagnosis Date  . IBS (irritable bowel syndrome)   . Infertility, female   . PCOS (polycystic ovarian syndrome)   . Headache      Past Surgical History  Procedure Laterality Date  . No past surgeries       OB History    Gravida Para Term Preterm AB TAB SAB Ectopic Multiple Living   1               History   Social History  . Marital Status: Married    Spouse Name: N/A  . Number of Children: N/A  . Years of Education: N/A   Social History  Main Topics  . Smoking status: Never Smoker   . Smokeless tobacco: Never Used  . Alcohol Use: No  . Drug Use: No  . Sexual Activity: Yes    Birth Control/ Protection: None   Other Topics Concern  . None   Social History Narrative    Family History  Problem Relation Age of Onset  . Diabetes Maternal Grandmother   . Hypertension Maternal Grandmother   . Hypertension Paternal Grandmother     No Known Allergies  Prescriptions prior to admission  Medication Sig Dispense Refill Last Dose  . diphenhydramine-acetaminophen (TYLENOL PM) 25-500 MG TABS Take 2 tablets by mouth at bedtime as needed (sleep).    01/25/2015 at Unknown time  . Doxylamine-Pyridoxine 10-10 MG TBEC Take 2 tablets by mouth at bedtime. Take 2 tablets at bedtime. If symptoms persist, add 1 tab in the AM starting on day 3. If symptoms persist, add 1 tab in the PM starting day 4. 90 tablet 1 01/25/2015 at Unknown time  . ferrous sulfate 325 (65 FE) MG tablet Take 1 tablet (325 mg total) by mouth 3 (three) times daily with meals. (Patient taking differently: Take 325 mg by mouth daily with breakfast. ) 90 tablet 3 01/25/2015 at Unknown time  . Prenatal Vit-Fe Fumarate-FA (PRENATAL MULTIVITAMIN) TABS tablet Take 1 tablet by mouth daily at 12 noon.   01/25/2015 at Unknown time  . ranitidine (ZANTAC) 150 MG capsule Take  1 capsule (150 mg total) by mouth daily. 60 capsule 2 01/26/2015 at Unknown time  . NIFEdipine (PROCARDIA XL) 30 MG 24 hr tablet Take 1 tablet (30 mg total) by mouth daily as needed (for contractions). (Patient not taking: Reported on 01/26/2015) 30 tablet 2 Not Taking at Unknown time     I have reviewed the patient's current medications. Prior to Admission:  Prescriptions prior to admission  Medication Sig Dispense Refill Last Dose  . diphenhydramine-acetaminophen (TYLENOL PM) 25-500 MG TABS Take 2 tablets by mouth at bedtime as needed (sleep).    01/25/2015 at Unknown time  . Doxylamine-Pyridoxine 10-10 MG TBEC  Take 2 tablets by mouth at bedtime. Take 2 tablets at bedtime. If symptoms persist, add 1 tab in the AM starting on day 3. If symptoms persist, add 1 tab in the PM starting day 4. 90 tablet 1 01/25/2015 at Unknown time  . ferrous sulfate 325 (65 FE) MG tablet Take 1 tablet (325 mg total) by mouth 3 (three) times daily with meals. (Patient taking differently: Take 325 mg by mouth daily with breakfast. ) 90 tablet 3 01/25/2015 at Unknown time  . Prenatal Vit-Fe Fumarate-FA (PRENATAL MULTIVITAMIN) TABS tablet Take 1 tablet by mouth daily at 12 noon.   01/25/2015 at Unknown time  . ranitidine (ZANTAC) 150 MG capsule Take 1 capsule (150 mg total) by mouth daily. 60 capsule 2 01/26/2015 at Unknown time  . NIFEdipine (PROCARDIA XL) 30 MG 24 hr tablet Take 1 tablet (30 mg total) by mouth daily as needed (for contractions). (Patient not taking: Reported on 01/26/2015) 30 tablet 2 Not Taking at Unknown time    Review of Systems - History obtained from the patient General ROS: negative for - chills or fever Ophthalmic ROS: negative for - scotomata Respiratory ROS: no cough, shortness of breath, or wheezing Cardiovascular ROS: no chest pain or dyspnea on exertion Gastrointestinal ROS: positive for - abdominal pain negative for - constipation, diarrhea or nausea/vomiting Genito-Urinary ROS: no dysuria, trouble voiding, or hematuria Musculoskeletal ROS: negative Neurological ROS: negative for - confusion, dizziness, gait disturbance, numbness/tingling, seizures or visual changes, positive for HA  Vitals:  BP 150/93 mmHg  Pulse 100  Resp 16  LMP 06/07/2014 (Approximate) Physical Examination:  General appearance - alert, well appearing, and in no distress Eyes - pupils equal and reactive, extraocular eye movements intact Chest - clear to auscultation, no wheezes, rales or rhonchi, symmetric air entry Heart - normal rate, regular rhythm, normal S1, S2, no murmurs, rubs, clicks or gallops Abdomen - soft,  nontender, nondistended, no masses or organomegaly Neurological - alert, oriented, normal speech, no focal findings or movement disorder noted, DTR's normal and symmetric Extremities - peripheral pulses normal, 1+ pedal edema to ankles, no clubbing or cyanosis Abdomen: gravid and fundal height  is size equals dates Pelvic Exam: examination not indicated Extremities: edema 1+ with DTRs 2+ bilaterally Membranes:intact Fetal Monitoring:Baseline: 145 bpm, Variability: Good {> 6 bpm), Accelerations: Reactive and Decelerations: Absent TOCO: quiet  Labs:  Results for orders placed or performed during the hospital encounter of 01/26/15 (from the past 24 hour(s))  Protein / creatinine ratio, urine   Collection Time: 01/26/15  3:10 PM  Result Value Ref Range   Creatinine, Urine 29.00 mg/dL   Total Protein, Urine <6 mg/dL   Protein Creatinine Ratio        0.00 - 0.15 mg/mg[Cre]  Comprehensive metabolic panel   Collection Time: 01/26/15  3:53 PM  Result Value Ref Range  Sodium 138 135 - 145 mmol/L   Potassium 3.0 (L) 3.5 - 5.1 mmol/L   Chloride 108 101 - 111 mmol/L   CO2 23 22 - 32 mmol/L   Glucose, Bld 124 (H) 65 - 99 mg/dL   BUN <5 (L) 6 - 20 mg/dL   Creatinine, Ser 1.61 0.44 - 1.00 mg/dL   Calcium 8.7 (L) 8.9 - 10.3 mg/dL   Total Protein 6.3 (L) 6.5 - 8.1 g/dL   Albumin 2.6 (L) 3.5 - 5.0 g/dL   AST 57 (H) 15 - 41 U/L   ALT 11 (L) 14 - 54 U/L   Alkaline Phosphatase 140 (H) 38 - 126 U/L   Total Bilirubin 0.5 0.3 - 1.2 mg/dL   GFR calc non Af Amer >60 >60 mL/min   GFR calc Af Amer >60 >60 mL/min   Anion gap 7 5 - 15  CBC   Collection Time: 01/26/15  3:53 PM  Result Value Ref Range   WBC 9.4 4.0 - 10.5 K/uL   RBC 3.61 (L) 3.87 - 5.11 MIL/uL   Hemoglobin 9.9 (L) 12.0 - 15.0 g/dL   HCT 09.6 (L) 04.5 - 40.9 %   MCV 80.9 78.0 - 100.0 fL   MCH 27.4 26.0 - 34.0 pg   MCHC 33.9 30.0 - 36.0 g/dL   RDW 81.1 91.4 - 78.2 %   Platelets 247 150 - 400 K/uL    Imaging Studies: US Ob Follow  Up  01/06/2015   OBSTETRICAL ULTRASOUND: This exam was performed within a [place] [facility]. The OB US report was generated in the AS system, and faxed to the ordering physician.   This report is available in the [employer]. See the AS Obstetric US report via the Image Link.    Assessment and Plan: Patient Active Problem List   Diagnosis Date Noted  . Gestational hypertension 01/26/2015  . Threatened preterm labor, antepartum 01/12/2015  . Supervision of normal first pregnancy 07/28/2014  . Obesity (BMI 30.0-34.9) 07/28/2014  . Obesity affecting pregnancy, antepartum 07/28/2014  . Infertility associated with anovulation 07/28/2014   Chelsea Ferguson is a 19 yo G1P0 @ [redacted]w[redacted]d by L/6 who presents with elevated BP and r/o pre-eclampsia  #) gHTN vs pre-eclampsia (POA) - Pt with mild range BP, close to severe range BP, which have escalated over past week. Now with HA and nausea with abdominal pain, concerning for developing pre-eclampsia. AST increased from 21 --> 57 in 5 days, other wise HELLP labs WNL and UP:C not able to be calculated because urine protein < 6. - AM HELLP labs - growth [place] with UA dopplers - 24 hour urine protein - serial BP monitorings - treat severe range BP (> 160/110)  #) FWB - Cat I, reassuring - BMZ 12 mg IM x 2 - growth [place] with UA dopplers - NST q shift  Ethelda Chick, MD OB fellow Faculty Practice, [facility] [place]

## 2015-01-27 DIAGNOSIS — Z3403 Encounter for supervision of normal first pregnancy, third trimester: Secondary | ICD-10-CM | POA: Diagnosis not present

## 2015-01-27 DIAGNOSIS — O132 Gestational [pregnancy-induced] hypertension without significant proteinuria, second trimester: Secondary | ICD-10-CM | POA: Diagnosis present

## 2015-01-27 DIAGNOSIS — R030 Elevated blood-pressure reading, without diagnosis of hypertension: Secondary | ICD-10-CM | POA: Diagnosis not present

## 2015-01-27 DIAGNOSIS — O1490 Unspecified pre-eclampsia, unspecified trimester: Secondary | ICD-10-CM | POA: Insufficient documentation

## 2015-01-27 DIAGNOSIS — O133 Gestational [pregnancy-induced] hypertension without significant proteinuria, third trimester: Secondary | ICD-10-CM | POA: Insufficient documentation

## 2015-01-27 DIAGNOSIS — E669 Obesity, unspecified: Secondary | ICD-10-CM | POA: Diagnosis present

## 2015-01-27 DIAGNOSIS — Z3A33 33 weeks gestation of pregnancy: Secondary | ICD-10-CM | POA: Insufficient documentation

## 2015-01-27 DIAGNOSIS — Z8249 Family history of ischemic heart disease and other diseases of the circulatory system: Secondary | ICD-10-CM | POA: Diagnosis not present

## 2015-01-27 DIAGNOSIS — Z833 Family history of diabetes mellitus: Secondary | ICD-10-CM | POA: Diagnosis not present

## 2015-01-27 DIAGNOSIS — O99212 Obesity complicating pregnancy, second trimester: Secondary | ICD-10-CM | POA: Diagnosis present

## 2015-01-27 DIAGNOSIS — Z6830 Body mass index (BMI) 30.0-30.9, adult: Secondary | ICD-10-CM | POA: Diagnosis not present

## 2015-01-27 DIAGNOSIS — Z3A26 26 weeks gestation of pregnancy: Secondary | ICD-10-CM | POA: Diagnosis present

## 2015-01-27 LAB — PROTEIN, URINE, 24 HOUR
Collection Interval-UPROT: 24 hours
Protein, 24H Urine: 168 mg/d — ABNORMAL HIGH (ref 50–100)
Protein, Urine: 8 mg/dL
URINE TOTAL VOLUME-UPROT: 2100 mL

## 2015-01-27 LAB — COMPREHENSIVE METABOLIC PANEL
ALT: 10 U/L — ABNORMAL LOW (ref 14–54)
AST: 31 U/L (ref 15–41)
Albumin: 2.7 g/dL — ABNORMAL LOW (ref 3.5–5.0)
Alkaline Phosphatase: 132 U/L — ABNORMAL HIGH (ref 38–126)
Anion gap: 7 (ref 5–15)
BUN: <5 mg/dL — ABNORMAL LOW (ref 6–20)
CALCIUM: 8.7 mg/dL — AB (ref 8.9–10.3)
CO2: 21 mmol/L — AB (ref 22–32)
Chloride: 109 mmol/L (ref 101–111)
Creatinine, Ser: 0.78 mg/dL (ref 0.44–1.00)
GFR calc non Af Amer: >60 mL/min (ref >60)
Glucose, Bld: 129 mg/dL — ABNORMAL HIGH (ref 65–99)
Potassium: 3.2 mmol/L — ABNORMAL LOW (ref 3.5–5.1)
Sodium: 137 mmol/L (ref 135–145)
Total Bilirubin: 0.4 mg/dL (ref 0.3–1.2)
Total Protein: 6 g/dL — ABNORMAL LOW (ref 6.5–8.1)

## 2015-01-27 LAB — CBC
HCT: 28.6 % — ABNORMAL LOW (ref 36.0–46.0)
Hemoglobin: 9.7 g/dL — ABNORMAL LOW (ref 12.0–15.0)
MCH: 27.4 pg (ref 26.0–34.0)
MCHC: 33.9 g/dL (ref 30.0–36.0)
MCV: 80.8 fL (ref 78.0–100.0)
PLATELETS: 246 10*3/uL (ref 150–400)
RBC: 3.54 MIL/uL — AB (ref 3.87–5.11)
RDW: 15 % (ref 11.5–15.5)
WBC: 9.9 10*3/uL (ref 4.0–10.5)

## 2015-01-27 MED ORDER — SODIUM CHLORIDE 0.9 % IJ SOLN
3.0000 mL | Freq: Two times a day (BID) | INTRAMUSCULAR | Status: DC
  Administered 2015-01-27 – 2015-01-28 (×2): 3 mL via INTRAVENOUS

## 2015-01-27 NOTE — Progress Notes (Signed)
Patient ID: Chelsea Ferguson, female   DOB: 24-May-1996, 19 y.o.   MRN: [id number] FACULTY PRACTICE ANTEPARTUM(COMPREHENSIVE) NOTE  Chelsea Ferguson is a 19 y.o. G1P0 at [redacted]w[redacted]d  who is admitted for evaluation of GHTN vs preeclampsia.    Fetal presentation is unsure. Length of Stay:    Days  Date of admission:01/26/2015  Subjective: Patient is without complaints. She denies headaches, visual disturbances, RUQ/epigastric pain, nausea or emesis Patient reports the fetal movement as active. Patient reports uterine contraction  activity as none. Patient reports  vaginal bleeding as none. Patient describes fluid per vagina as None.  Vitals:  Blood pressure 129/74, pulse 64, temperature 97.8 F (36.6 C), temperature source Axillary, resp. rate 18, height 5\' 1"  (1.549 m), weight 192 lb (87.091 kg), last menstrual period 06/07/2014. Filed Vitals:   01/26/15 1643 01/26/15 2050 01/27/15 0015 01/27/15 0518  BP: 150/93 118/72 119/63 129/74  Pulse:  74 68 64  Temp:  98.3 F (36.8 C)  97.8 F (36.6 C)  TempSrc:  Oral  Axillary  Resp:  18 18 18   Height:  5\' 1"  (1.549 m)    Weight:  192 lb (87.091 kg)     Physical Examination:  General appearance - alert, well appearing, and in no distress Fundal Height:  size equals dates Pelvic Exam:  examination not indicated Cervical Exam: Not evaluated. Extremities: extremities normal, atraumatic, no cyanosis or edema with DTRs 2+ bilaterally Membranes:intact  Fetal Monitoring:  Baseline: 150 bpm, Variability: Good {> 6 bpm), Accelerations: Reactive, Decelerations: Absent and Toco: no contractions   reactive  Labs:  Results for orders placed or performed during the hospital encounter of 01/26/15 (from the past 24 hour(s))  Protein / creatinine ratio, urine   Collection Time: 01/26/15  3:10 PM  Result Value Ref Range   Creatinine, Urine 29.00 mg/dL   Total Protein, Urine <6 mg/dL   Protein Creatinine Ratio        0.00 - 0.15 mg/mg[Cre]  Comprehensive  metabolic panel   Collection Time: 01/26/15  3:53 PM  Result Value Ref Range   Sodium 138 135 - 145 mmol/L   Potassium 3.0 (L) 3.5 - 5.1 mmol/L   Chloride 108 101 - 111 mmol/L   CO2 23 22 - 32 mmol/L   Glucose, Bld 124 (H) 65 - 99 mg/dL   BUN <5 (L) 6 - 20 mg/dL   Creatinine, Ser 6.76 0.44 - 1.00 mg/dL   Calcium 8.7 (L) 8.9 - 10.3 mg/dL   Total Protein 6.3 (L) 6.5 - 8.1 g/dL   Albumin 2.6 (L) 3.5 - 5.0 g/dL   AST 57 (H) 15 - 41 U/L   ALT 11 (L) 14 - 54 U/L   Alkaline Phosphatase 140 (H) 38 - 126 U/L   Total Bilirubin 0.5 0.3 - 1.2 mg/dL   GFR calc non Af Amer >60 >60 mL/min   GFR calc Af Amer >60 >60 mL/min   Anion gap 7 5 - 15  CBC   Collection Time: 01/26/15  3:53 PM  Result Value Ref Range   WBC 9.4 4.0 - 10.5 K/uL   RBC 3.61 (L) 3.87 - 5.11 MIL/uL   Hemoglobin 9.9 (L) 12.0 - 15.0 g/dL   HCT 72.0 (L) 94.7 - 09.6 %   MCV 80.9 78.0 - 100.0 fL   MCH 27.4 26.0 - 34.0 pg   MCHC 33.9 30.0 - 36.0 g/dL   RDW 28.3 66.2 - 94.7 %   Platelets 247 150 - 400 K/uL  Type and screen   Collection Time: 01/26/15  6:43 PM  Result Value Ref Range   ABO/RH(D) O POS    Antibody Screen NEG    Sample Expiration 01/29/2015   Comprehensive metabolic panel   Collection Time: 01/27/15  5:10 AM  Result Value Ref Range   Sodium 137 135 - 145 mmol/L   Potassium 3.2 (L) 3.5 - 5.1 mmol/L   Chloride 109 101 - 111 mmol/L   CO2 21 (L) 22 - 32 mmol/L   Glucose, Bld 129 (H) 65 - 99 mg/dL   BUN <5 (L) 6 - 20 mg/dL   Creatinine, Ser 1.61 0.44 - 1.00 mg/dL   Calcium 8.7 (L) 8.9 - 10.3 mg/dL   Total Protein 6.0 (L) 6.5 - 8.1 g/dL   Albumin 2.7 (L) 3.5 - 5.0 g/dL   AST 31 15 - 41 U/L   ALT 10 (L) 14 - 54 U/L   Alkaline Phosphatase 132 (H) 38 - 126 U/L   Total Bilirubin 0.4 0.3 - 1.2 mg/dL   GFR calc non Af Amer >60 >60 mL/min   GFR calc Af Amer >60 >60 mL/min   Anion gap 7 5 - 15  CBC   Collection Time: 01/27/15  5:10 AM  Result Value Ref Range   WBC 9.9 4.0 - 10.5 K/uL   RBC 3.54 (L) 3.87 -  5.11 MIL/uL   Hemoglobin 9.7 (L) 12.0 - 15.0 g/dL   HCT 09.6 (L) 04.5 - 40.9 %   MCV 80.8 78.0 - 100.0 fL   MCH 27.4 26.0 - 34.0 pg   MCHC 33.9 30.0 - 36.0 g/dL   RDW 81.1 91.4 - 78.2 %   Platelets 246 150 - 400 K/uL    Imaging Studies:    Currently EPIC will not allow sonographic studies to automatically populate into notes.  In the meantime, copy and paste results into note or free text.  Medications:  Scheduled . betamethasone acetate-betamethasone sodium phosphate  12 mg Intramuscular Q24H  . docusate sodium  100 mg Oral Daily  . famotidine  10 mg Oral Daily  . ferrous sulfate  325 mg Oral Q breakfast  . prenatal multivitamin  1 tablet Oral Q1200  . sodium chloride  3 mL Intravenous Q12H   I have reviewed the patient's current medications.  ASSESSMENT: G1P0 [redacted]w[redacted]d Estimated Date of Delivery: 03/12/15  Patient Active Problem List   Diagnosis Date Noted  . Gestational hypertension w/o significant proteinuria in 3rd trimester   . Gestational hypertension 01/26/2015  . Threatened preterm labor, antepartum 01/12/2015  . Supervision of normal first pregnancy 07/28/2014  . Obesity (BMI 30.0-34.9) 07/28/2014  . Obesity affecting pregnancy, antepartum 07/28/2014  . Infertility associated with anovulation 07/28/2014    PLAN: Continue monitoring BP  Continue 24 hour urine collection Complete betamethasone administration today Continue routine antepartum care  Keyonta Barradas 01/27/2015,7:33 AM

## 2015-01-28 DIAGNOSIS — Z3A33 33 weeks gestation of pregnancy: Secondary | ICD-10-CM

## 2015-01-28 LAB — CULTURE, BETA STREP (GROUP B ONLY)

## 2015-01-28 NOTE — Discharge Summary (Addendum)
Physician Discharge Summary  Patient ID: Chelsea Ferguson MRN: [id number] DOB/AGE: 01/31/1996 19 y.o.  Admit date: 01/26/2015 Discharge date: 01/28/2015  Admission Diagnoses:  Same  Discharge Diagnoses:  Active Problems:   Supervision of normal first pregnancy   Obesity (BMI 30.0-34.9)   Gestational hypertension   Gestational hypertension w/o significant proteinuria in 3rd trimester   Pre-eclampsia   [redacted] weeks gestation of pregnancy   Discharged Condition: good  Hospital Course: 19 year old G1 P0 at 33 weeks and 6 days who was admitted for elevated blood pressures with concerns of preeclampsia. The patient was admitted, given 2 doses of betamethasone, and had a 24-hour urine protein collection done which revealed 168 mg of protein. On admission the patient had a slightly elevated AST at 57, but this resolved the subsequent day. The patient's being discharged home to follow-up in the clinic for twice weekly testing for gestational hypertension.  Consults: None  Significant Diagnostic Studies: labs: 24-hour urine protein: 168 mg protein, AST 57, normal on repeat.    Treatments: Betamethasone 2  Discharge Exam: Blood pressure 127/65, pulse 96, temperature 98.2 F (36.8 C), temperature source Oral, resp. rate 18, height  (1.549 m), weight 192 lb (87.091 kg), last menstrual period 06/07/2014, SpO2 97 %. General appearance: alert, cooperative and no distress Head: Normocephalic, without obvious abnormality, atraumatic Resp: clear to auscultation bilaterally and normal percussion bilaterally Cardio: regular rate and rhythm, S1, S2 normal, no murmur, click, rub or gallop GI: soft, non-tender; bowel sounds normal; no masses,  no organomegaly Extremities: extremities normal, atraumatic, no cyanosis or edema  NST: Category 1 and reactive  Disposition: 01-Home or Self Care  Discharge Instructions    Discharge activity:  No Restrictions    Complete by:  As directed       Discharge diet:  No restrictions    Complete by:  As directed      Discharge instructions    Complete by:  As directed   preeclampsia     No sexual activity restrictions    Complete by:  As directed      Notify physician for a general feeling that "something is not right"    Complete by:  As directed      Notify physician for increase or change in vaginal discharge    Complete by:  As directed      Notify physician for intestinal cramps, with or without diarrhea, sometimes described as "gas pain"    Complete by:  As directed      Notify physician for leaking of fluid    Complete by:  As directed      Notify physician for low, dull backache, unrelieved by heat or Tylenol    Complete by:  As directed      Notify physician for menstrual like cramps    Complete by:  As directed      Notify physician for pelvic pressure    Complete by:  As directed      Notify physician for uterine contractions.  These may be painless and feel like the uterus is tightening or the baby is  "balling up"    Complete by:  As directed      Notify physician for vaginal bleeding    Complete by:  As directed      PRETERM LABOR:  Includes any of the follwing symptoms that occur between 20 - [redacted] weeks gestation.  If these symptoms are not stopped, preterm labor can result in preterm delivery, placing your  baby at risk    Complete by:  As directed             Medication List    TAKE these medications        diphenhydramine-acetaminophen 25-500 MG Tabs  Commonly known as:  TYLENOL PM  Take 2 tablets by mouth at bedtime as needed (sleep).     Doxylamine-Pyridoxine 10-10 MG Tbec  Take 2 tablets by mouth at bedtime. Take 2 tablets at bedtime. If symptoms persist, add 1 tab in the AM starting on day 3. If symptoms persist, add 1 tab in the PM starting day 4.     ferrous sulfate 325 (65 FE) MG tablet  Take 1 tablet (325 mg total) by mouth 3 (three) times daily with meals.     NIFEdipine 30 MG 24 hr tablet   Commonly known as:  PROCARDIA XL  Take 1 tablet (30 mg total) by mouth daily as needed (for contractions).     prenatal multivitamin Tabs tablet  Take 1 tablet by mouth daily at 12 noon.     ranitidine 150 MG capsule  Commonly known as:  ZANTAC  Take 1 capsule (150 mg total) by mouth daily.           Follow-up Information    Follow up with [facility] In 3 days.   Specialty:  Obstetrics and Gynecology   Why:  NST   Contact information:   [address] [place] [address] [phone]      Signed: Brooke Pace 01/28/2015, 6:46 AM

## 2015-01-28 NOTE — Discharge Instructions (Signed)

## 2015-01-29 ENCOUNTER — Inpatient Hospital Stay (HOSPITAL_COMMUNITY): Payer: Medicaid Other

## 2015-01-29 ENCOUNTER — Inpatient Hospital Stay (HOSPITAL_COMMUNITY)
Admission: AD | Admit: 2015-01-29 | Discharge: 2015-01-30 | Disposition: A | Discharge status: Home / Self Care | Payer: Medicaid Other | Source: Ambulatory Visit | Attending: Family Medicine | Admitting: Family Medicine

## 2015-01-29 ENCOUNTER — Encounter (HOSPITAL_COMMUNITY): Payer: Self-pay | Admitting: *Deleted

## 2015-01-29 DIAGNOSIS — R079 Chest pain, unspecified: Secondary | ICD-10-CM | POA: Insufficient documentation

## 2015-01-29 DIAGNOSIS — O288 Other abnormal findings on antenatal screening of mother: Secondary | ICD-10-CM

## 2015-01-29 DIAGNOSIS — Z3A34 34 weeks gestation of pregnancy: Secondary | ICD-10-CM | POA: Diagnosis not present

## 2015-01-29 DIAGNOSIS — O368130 Decreased fetal movements, third trimester, not applicable or unspecified: Secondary | ICD-10-CM | POA: Diagnosis present

## 2015-01-29 DIAGNOSIS — O368190 Decreased fetal movements, unspecified trimester, not applicable or unspecified: Secondary | ICD-10-CM

## 2015-01-29 DIAGNOSIS — O4703 False labor before 37 completed weeks of gestation, third trimester: Secondary | ICD-10-CM

## 2015-01-29 NOTE — MAU Note (Signed)
Pt reports she has not felt baby move much today . Woke up today and had a wet spot in her bed . Not much since. D/C from hospital yesterday for preeclampsia.

## 2015-01-29 NOTE — MAU Provider Note (Signed)
History   CSN: [id number]  Arrival date and time: 01/29/15 1726  Chief Complaint  Patient presents with  . Decreased Fetal Movement  . Vaginal Discharge    HPI   Patient is 19 y.o. G1P0 [redacted]w[redacted]d, PMH significant for preE, here with complaints of decreased fetal movement since this morning. Has tried eating sweet things and moving around. Baby usually very active so patient was concerned and came in. Also of note, patient with wet underwear and a wet spot on bed. She does not know if her water has broken. Endorses irregular contractions.   Chest pain today located in her mid sternal area. She is able to point to area and says pain gets worse with deep inspiration.   OB History    Gravida Para Term Preterm AB TAB SAB Ectopic Multiple Living   1         0      Past Medical History  Diagnosis Date  . IBS (irritable bowel syndrome)   . Infertility, female   . PCOS (polycystic ovarian syndrome)   . Headache     Past Surgical History  Procedure Laterality Date  . No past surgeries      Family History  Problem Relation Age of Onset  . Diabetes Maternal Grandmother   . Hypertension Maternal Grandmother   . Hypertension Paternal Grandmother     History  Substance Use Topics  . Smoking status: Never Smoker   . Smokeless tobacco: Never Used  . Alcohol Use: No    Allergies: No Known Allergies  Prescriptions prior to admission  Medication Sig Dispense Refill Last Dose  . acetaminophen (TYLENOL) 500 MG tablet Take 1,000 mg by mouth every 6 (six) hours as needed for mild pain, moderate pain or headache.   01/28/2015 at 0400  . diphenhydramine-acetaminophen (TYLENOL PM) 25-500 MG TABS Take 2 tablets by mouth at bedtime as needed (sleep).    01/28/2015 at Unknown time  . Doxylamine-Pyridoxine 10-10 MG TBEC Take 2 tablets by mouth at bedtime. Take 2 tablets at bedtime. If symptoms persist, add 1 tab in the AM starting on day 3. If symptoms persist, add 1 tab in the PM starting day 4.  90 tablet 1 01/28/2015 at Unknown time  . ferrous sulfate 325 (65 FE) MG tablet Take 1 tablet (325 mg total) by mouth 3 (three) times daily with meals. (Patient taking differently: Take 325 mg by mouth daily with breakfast. ) 90 tablet 3 Past Week at Unknown time  . Prenatal Vit-Fe Fumarate-FA (PRENATAL MULTIVITAMIN) TABS tablet Take 1 tablet by mouth daily at 12 noon.   Past Week at Unknown time  . ranitidine (ZANTAC) 150 MG capsule Take 1 capsule (150 mg total) by mouth daily. 60 capsule 2 01/29/2015 at Unknown time  . NIFEdipine (PROCARDIA XL) 30 MG 24 hr tablet Take 1 tablet (30 mg total) by mouth daily as needed (for contractions). (Patient not taking: Reported on 01/26/2015) 30 tablet 2 Not Taking at Unknown time    Review of Systems  Constitutional: Negative for fever and chills.  Eyes: Negative for blurred vision.  Respiratory: Negative for shortness of breath.   Cardiovascular: Positive for chest pain and leg swelling.  Gastrointestinal: Negative for nausea, vomiting and constipation.  Genitourinary: Negative for dysuria.  Neurological: Negative for headaches.  Also per HPI  Physical Exam   Blood pressure 128/82, pulse 65, temperature 98.2 F (36.8 C), resp. rate 18, height 5\' 1"  (1.549 m), weight 189 lb (85.73 kg), last menstrual  period 06/07/2014.  Physical Exam  Constitutional: She is oriented to person, place, and time. She appears well-developed and well-nourished. No distress.  HENT:  Head: Normocephalic and atraumatic.  Eyes: EOM are normal.  Cardiovascular: Normal rate, regular rhythm, normal heart sounds and intact distal pulses.   Respiratory: Effort normal and breath sounds normal.  GI: Soft. There is no tenderness.  Genitourinary: Vagina normal.  Musculoskeletal: Normal range of motion. She exhibits edema. She exhibits no tenderness.  Neurological: She is alert and oriented to person, place, and time.  Skin: Skin is warm and dry.    Dilation: 1 Effacement (%):  Thick Station: -3 Exam by:: Doroteo Glassman  No results found for this or any previous visit (from the past 24 hour(s)).  MAU Course  Procedures - None  MDM: - NST non-reactive -> ordered BPP - Speculum exam: no pooling - Fern negative - Cervical exam reassuring  Assessment and Plan   Care was transferred to [facility] to resume care.   Caryl Ada, DO 01/29/2015, 7:22 PM  PGY-1, [facility]   BPP 6/8 (no breathing movements).  NST now reactive. Discussed with Dr. Shawnie Pons.  Will observe for 2 more hours; if NST remains reactive, will dc home. 10:18 PM   NST reactive with frequent accels, no decels.  Irregular and mild ctx.  Cx 1thick-3 per RN exam.   DC home with [place] and labor precautions. F/U Monday for NST [facility]) or earlier PRN

## 2015-01-29 NOTE — Discharge Instructions (Signed)
Fetal Movement Counts °Patient Name: __________________________________________________ Patient Due Date: ____________________ °Performing a fetal movement count is highly recommended in high-risk pregnancies, but it is good for every pregnant woman to do. Your health care provider may ask you to start counting fetal movements at 28 weeks of the pregnancy. Fetal movements often increase: °· After eating a full meal. °· After physical activity. °· After eating or drinking something sweet or cold. °· At rest. °Pay attention to when you feel the baby is most active. This will help you notice a pattern of your baby's sleep and wake cycles and what factors contribute to an increase in fetal movement. It is important to perform a fetal movement count at the same time each day when your baby is normally most active.  °HOW TO COUNT FETAL MOVEMENTS °· Find a quiet and comfortable area to sit or lie down on your left side. Lying on your left side provides the best blood and oxygen circulation to your baby. °· Write down the day and time on a sheet of paper or in a journal. °· Start counting kicks, flutters, swishes, rolls, or jabs in a 2-hour period. You should feel at least 10 movements within 2 hours. °· If you do not feel 10 movements in 2 hours, wait 2-3 hours and count again. Look for a change in the pattern or not enough counts in 2 hours. °SEEK MEDICAL CARE IF: °· You feel less than 10 counts in 2 hours, tried twice. °· There is no movement in over an hour. °· The pattern is changing or taking longer each day to reach 10 counts in 2 hours. °· You feel the baby is not moving as he or she usually does. °Date: ____________ Movements: ____________ Start time: ____________ Finish time: ____________  °Date: ____________ Movements: ____________ Start time: ____________ Finish time: ____________ °Date: ____________ Movements: ____________ Start time: ____________ Finish time: ____________ °Date: ____________ Movements:  ____________ Start time: ____________ Finish time: ____________ °Date: ____________ Movements: ____________ Start time: ____________ Finish time: ____________ °Date: ____________ Movements: ____________ Start time: ____________ Finish time: ____________ °Date: ____________ Movements: ____________ Start time: ____________ Finish time: ____________ °Date: ____________ Movements: ____________ Start time: ____________ Finish time: ____________  °Date: ____________ Movements: ____________ Start time: ____________ Finish time: ____________ °Date: ____________ Movements: ____________ Start time: ____________ Finish time: ____________ °Date: ____________ Movements: ____________ Start time: ____________ Finish time: ____________ °Date: ____________ Movements: ____________ Start time: ____________ Finish time: ____________ °Date: ____________ Movements: ____________ Start time: ____________ Finish time: ____________ °Date: ____________ Movements: ____________ Start time: ____________ Finish time: ____________ °Date: ____________ Movements: ____________ Start time: ____________ Finish time: ____________  °Date: ____________ Movements: ____________ Start time: ____________ Finish time: ____________ °Date: ____________ Movements: ____________ Start time: ____________ Finish time: ____________ °Date: ____________ Movements: ____________ Start time: ____________ Finish time: ____________ °Date: ____________ Movements: ____________ Start time: ____________ Finish time: ____________ °Date: ____________ Movements: ____________ Start time: ____________ Finish time: ____________ °Date: ____________ Movements: ____________ Start time: ____________ Finish time: ____________ °Date: ____________ Movements: ____________ Start time: ____________ Finish time: ____________  °Date: ____________ Movements: ____________ Start time: ____________ Finish time: ____________ °Date: ____________ Movements: ____________ Start time: ____________ Finish  time: ____________ °Date: ____________ Movements: ____________ Start time: ____________ Finish time: ____________ °Date: ____________ Movements: ____________ Start time: ____________ Finish time: ____________ °Date: ____________ Movements: ____________ Start time: ____________ Finish time: ____________ °Date: ____________ Movements: ____________ Start time: ____________ Finish time: ____________ °Date: ____________ Movements: ____________ Start time: ____________ Finish time: ____________  °Date: ____________ Movements: ____________ Start time: ____________ Finish   time: ____________ °Date: ____________ Movements: ____________ Start time: ____________ Finish time: ____________ °Date: ____________ Movements: ____________ Start time: ____________ Finish time: ____________ °Date: ____________ Movements: ____________ Start time: ____________ Finish time: ____________ °Date: ____________ Movements: ____________ Start time: ____________ Finish time: ____________ °Date: ____________ Movements: ____________ Start time: ____________ Finish time: ____________ °Date: ____________ Movements: ____________ Start time: ____________ Finish time: ____________  °Date: ____________ Movements: ____________ Start time: ____________ Finish time: ____________ °Date: ____________ Movements: ____________ Start time: ____________ Finish time: ____________ °Date: ____________ Movements: ____________ Start time: ____________ Finish time: ____________ °Date: ____________ Movements: ____________ Start time: ____________ Finish time: ____________ °Date: ____________ Movements: ____________ Start time: ____________ Finish time: ____________ °Date: ____________ Movements: ____________ Start time: ____________ Finish time: ____________ °Date: ____________ Movements: ____________ Start time: ____________ Finish time: ____________  °Date: ____________ Movements: ____________ Start time: ____________ Finish time: ____________ °Date: ____________  Movements: ____________ Start time: ____________ Finish time: ____________ °Date: ____________ Movements: ____________ Start time: ____________ Finish time: ____________ °Date: ____________ Movements: ____________ Start time: ____________ Finish time: ____________ °Date: ____________ Movements: ____________ Start time: ____________ Finish time: ____________ °Date: ____________ Movements: ____________ Start time: ____________ Finish time: ____________ °Date: ____________ Movements: ____________ Start time: ____________ Finish time: ____________  °Date: ____________ Movements: ____________ Start time: ____________ Finish time: ____________ °Date: ____________ Movements: ____________ Start time: ____________ Finish time: ____________ °Date: ____________ Movements: ____________ Start time: ____________ Finish time: ____________ °Date: ____________ Movements: ____________ Start time: ____________ Finish time: ____________ °Date: ____________ Movements: ____________ Start time: ____________ Finish time: ____________ °Date: ____________ Movements: ____________ Start time: ____________ Finish time: ____________ °Document Released: 08/24/2006 Document Revised: 12/09/2013 Document Reviewed: 05/21/2012 °[employer]® Patient Information ©2015 [employer]. This information is not intended to replace advice given to you by your health care provider. Make sure you discuss any questions you have with your health care provider. °Braxton Hicks Contractions °Contractions of the uterus can occur throughout pregnancy. Contractions are not always a sign that you are in labor.  °WHAT ARE BRAXTON HICKS CONTRACTIONS?  °Contractions that occur before labor are called Braxton Hicks contractions, or false labor. Toward the end of pregnancy (32-34 weeks), these contractions can develop more often and may become more forceful. This is not true labor because these contractions do not result in opening (dilatation) and thinning of the cervix. They  are sometimes difficult to tell apart from true labor because these contractions can be forceful and people have different pain tolerances. You should not feel embarrassed if you go to the hospital with false labor. Sometimes, the only way to tell if you are in true labor is for your health care provider to look for changes in the cervix. °If there are no prenatal problems or other health problems associated with the pregnancy, it is completely safe to be sent home with false labor and await the onset of true labor. °HOW CAN YOU TELL THE DIFFERENCE BETWEEN TRUE AND FALSE LABOR? °False Labor °· The contractions of false labor are usually shorter and not as hard as those of true labor.   °· The contractions are usually irregular.   °· The contractions are often felt in the front of the lower abdomen and in the groin.   °· The contractions may go away when you walk around or change positions while lying down.   °· The contractions get weaker and are shorter lasting as time goes on.   °· The contractions do not usually become progressively stronger, regular, and closer together as with true labor.   °True Labor °· Contractions in true labor last 30-70 seconds, become   very regular, usually become more intense, and increase in frequency.   °· The contractions do not go away with walking.   °· The discomfort is usually felt in the top of the uterus and spreads to the lower abdomen and low back.   °· True labor can be determined by your health care provider with an exam. This will show that the cervix is dilating and getting thinner.   °WHAT TO REMEMBER °· Keep up with your usual exercises and follow other instructions given by your health care provider.   °· Take medicines as directed by your health care provider.   °· Keep your regular prenatal appointments.   °· Eat and drink lightly if you think you are going into labor.   °· If Braxton Hicks contractions are making you uncomfortable:   °¨ Change your position from  lying down or resting to walking, or from walking to resting.   °¨ Sit and rest in a tub of warm water.   °¨ Drink 2-3 glasses of water. Dehydration may cause these contractions.   °¨ Do slow and deep breathing several times an hour.   °WHEN SHOULD I SEEK IMMEDIATE MEDICAL CARE? °Seek immediate medical care if: °· Your contractions become stronger, more regular, and closer together.   °· You have fluid leaking or gushing from your vagina.   °· You have a fever.   °· You pass blood-tinged mucus.   °· You have vaginal bleeding.   °· You have continuous abdominal pain.   °· You have low back pain that you never had before.   °· You feel your baby's head pushing down and causing pelvic pressure.   °· Your baby is not moving as much as it used to.   °Document Released: 07/25/2005 Document Revised: 07/30/2013 Document Reviewed: 05/06/2013 °[employer]® Patient Information ©2015 [employer]. This information is not intended to replace advice given to you by your health care provider. Make sure you discuss any questions you have with your health care provider. ° °

## 2015-01-30 DIAGNOSIS — O368190 Decreased fetal movements, unspecified trimester, not applicable or unspecified: Secondary | ICD-10-CM | POA: Insufficient documentation

## 2015-01-30 DIAGNOSIS — O288 Other abnormal findings on antenatal screening of mother: Secondary | ICD-10-CM | POA: Insufficient documentation

## 2015-02-02 ENCOUNTER — Ambulatory Visit (INDEPENDENT_AMBULATORY_CARE_PROVIDER_SITE_OTHER): Payer: Medicaid Other | Admitting: *Deleted

## 2015-02-02 VITALS — BP 128/85 | HR 73

## 2015-02-02 DIAGNOSIS — Z3493 Encounter for supervision of normal pregnancy, unspecified, third trimester: Secondary | ICD-10-CM

## 2015-02-02 DIAGNOSIS — O133 Gestational [pregnancy-induced] hypertension without significant proteinuria, third trimester: Secondary | ICD-10-CM | POA: Diagnosis not present

## 2015-02-04 ENCOUNTER — Encounter (HOSPITAL_COMMUNITY): Payer: Self-pay | Admitting: *Deleted

## 2015-02-04 ENCOUNTER — Inpatient Hospital Stay (HOSPITAL_COMMUNITY)
Admission: AD | Admit: 2015-02-04 | Discharge: 2015-02-04 | Disposition: A | Discharge status: Home / Self Care | Payer: Medicaid Other | Source: Ambulatory Visit | Attending: Obstetrics & Gynecology | Admitting: Obstetrics & Gynecology

## 2015-02-04 DIAGNOSIS — O1493 Unspecified pre-eclampsia, third trimester: Secondary | ICD-10-CM | POA: Diagnosis not present

## 2015-02-04 DIAGNOSIS — Z3493 Encounter for supervision of normal pregnancy, unspecified, third trimester: Secondary | ICD-10-CM | POA: Insufficient documentation

## 2015-02-04 DIAGNOSIS — Z3A34 34 weeks gestation of pregnancy: Secondary | ICD-10-CM | POA: Insufficient documentation

## 2015-02-04 LAB — CBC
HCT: 29.6 % — ABNORMAL LOW (ref 36.0–46.0)
Hemoglobin: 10 g/dL — ABNORMAL LOW (ref 12.0–15.0)
MCH: 27.5 pg (ref 26.0–34.0)
MCHC: 33.8 g/dL (ref 30.0–36.0)
MCV: 81.3 fL (ref 78.0–100.0)
PLATELETS: 270 10*3/uL (ref 150–400)
RBC: 3.64 MIL/uL — AB (ref 3.87–5.11)
RDW: 15.5 % (ref 11.5–15.5)
WBC: 10.9 10*3/uL — AB (ref 4.0–10.5)

## 2015-02-04 LAB — URINALYSIS, ROUTINE W REFLEX MICROSCOPIC
Bilirubin Urine: NEGATIVE
GLUCOSE, UA: NEGATIVE mg/dL
Ketones, ur: NEGATIVE mg/dL
Leukocytes, UA: NEGATIVE
Nitrite: NEGATIVE
PH: 6.5 (ref 5.0–8.0)
Protein, ur: NEGATIVE mg/dL
Specific Gravity, Urine: <1.005 — ABNORMAL LOW (ref 1.005–1.030)
Urobilinogen, UA: 0.2 mg/dL (ref 0.0–1.0)

## 2015-02-04 LAB — URINE MICROSCOPIC-ADD ON

## 2015-02-04 LAB — COMPREHENSIVE METABOLIC PANEL
ALK PHOS: 120 U/L (ref 38–126)
ALT: 8 U/L — AB (ref 14–54)
ANION GAP: 6 (ref 5–15)
AST: 19 U/L (ref 15–41)
Albumin: 2.8 g/dL — ABNORMAL LOW (ref 3.5–5.0)
BILIRUBIN TOTAL: 0.3 mg/dL (ref 0.3–1.2)
BUN: <5 mg/dL — ABNORMAL LOW (ref 6–20)
CALCIUM: 8.3 mg/dL — AB (ref 8.9–10.3)
CO2: 23 mmol/L (ref 22–32)
Chloride: 107 mmol/L (ref 101–111)
Creatinine, Ser: 0.68 mg/dL (ref 0.44–1.00)
GFR calc non Af Amer: >60 mL/min (ref >60)
Glucose, Bld: 84 mg/dL (ref 65–99)
Potassium: 2.9 mmol/L — ABNORMAL LOW (ref 3.5–5.1)
SODIUM: 136 mmol/L (ref 135–145)
Total Protein: 5.7 g/dL — ABNORMAL LOW (ref 6.5–8.1)

## 2015-02-04 LAB — PROTEIN / CREATININE RATIO, URINE
Creatinine, Urine: 28 mg/dL
Protein Creatinine Ratio: 0.25 mg/mg{Cre} — ABNORMAL HIGH (ref 0.00–0.15)
Total Protein, Urine: 7 mg/dL

## 2015-02-04 NOTE — MAU Note (Signed)
Patient presents at 234 weeks gestation stating she was told by the office that if they could not see her today she was to come to MAU for blood pressure readings, check liver enzymes and NST. Fetus active. Denies bleeding and only notes a mucus discharge that she has been experiencing.

## 2015-02-04 NOTE — Discharge Instructions (Signed)

## 2015-02-04 NOTE — MAU Provider Note (Signed)
History     CSN: [id number]  Arrival date and time: 02/04/15 1527   None     Chief Complaint  Patient presents with  . Hypertension  . Non-stress Test  . Labs Only   Hypertension This is a recurrent problem. The current episode started today. The problem is unchanged. Associated symptoms include peripheral edema (trace). Pertinent negatives include no anxiety, blurred vision, chest pain, headaches or malaise/fatigue. There are no compliance problems.    This is a 19 y.o. female at [redacted]w[redacted]d who presents from the office for evaluation of elevated BP.  Denies pain, leaking, bleeding, headache or visual changes. Reports good fetal movement. Husband has been taking BPs at home every 20 minutes.  She was admitted for evaluation of preeclampsia the week of 6/20 and discharged home on 6/22.  She had normal labs except for an elevation of AST to 57.  She received Betamethasone.  Gets care at [facility]  RN Note: Patient presents at [redacted] weeks gestation stating she was told by the office that if they could not see her today she was to come to MAU for blood pressure readings, check liver enzymes and NST. Fetus active. Denies bleeding and only notes a mucus discharge that she has been experiencing.          OB History    Gravida Para Term Preterm AB TAB SAB Ectopic Multiple Living   1         0      Past Medical History  Diagnosis Date  . IBS (irritable bowel syndrome)   . Infertility, female   . PCOS (polycystic ovarian syndrome)   . Headache     Past Surgical History  Procedure Laterality Date  . No past surgeries      Family History  Problem Relation Age of Onset  . Diabetes Maternal Grandmother   . Hypertension Maternal Grandmother   . Hypertension Paternal Grandmother     History  Substance Use Topics  . Smoking status: Never Smoker   . Smokeless tobacco: Never Used  . Alcohol Use: No    Allergies: No Known Allergies  Prescriptions prior to admission   Medication Sig Dispense Refill Last Dose  . diphenhydramine-acetaminophen (TYLENOL PM) 25-500 MG TABS Take 2 tablets by mouth at bedtime as needed (sleep).    02/03/2015 at Unknown time  . Doxylamine-Pyridoxine 10-10 MG TBEC Take 2 tablets by mouth at bedtime. Take 2 tablets at bedtime. If symptoms persist, add 1 tab in the AM starting on day 3. If symptoms persist, add 1 tab in the PM starting day 4. 90 tablet 1 02/03/2015 at Unknown time  . ferrous sulfate 325 (65 FE) MG tablet Take 1 tablet (325 mg total) by mouth 3 (three) times daily with meals. (Patient taking differently: Take 325 mg by mouth daily with breakfast. ) 90 tablet 3 Past Week at Unknown time  . Prenatal Vit-Fe Fumarate-FA (PRENATAL MULTIVITAMIN) TABS tablet Take 1 tablet by mouth daily at 12 noon.   02/04/2015 at Unknown time  . ranitidine (ZANTAC) 150 MG capsule Take 1 capsule (150 mg total) by mouth daily. 60 capsule 2 02/04/2015 at Unknown time  . acetaminophen (TYLENOL) 500 MG tablet Take 1,000 mg by mouth every 6 (six) hours as needed for mild pain, moderate pain or headache.   Not Taking at Unknown time  . NIFEdipine (PROCARDIA XL) 30 MG 24 hr tablet Take 1 tablet (30 mg total) by mouth daily as needed (for contractions). (Patient not  taking: Reported on 01/26/2015) 30 tablet 2 Not Taking at Unknown time   Medical, Surgical, Family and Social histories reviewed and are listed above.  Medications and allergies reviewed.    Review of Systems  Constitutional: Negative for malaise/fatigue.  Eyes: Negative for blurred vision and double vision.  Cardiovascular: Negative for chest pain.  Gastrointestinal: Negative for nausea, vomiting and abdominal pain.  Neurological: Negative for dizziness, focal weakness, seizures and headaches.  Other systems reported negative  Physical Exam   Blood pressure 135/80, pulse 77, temperature 98.2 F (36.8 C), temperature source Oral, resp. rate 18, height 5' 2.25" (1.581 m), weight 187 lb 2 oz  (84.879 kg), last menstrual period 06/07/2014.  Filed Vitals:   02/04/15 1700 02/04/15 1715 02/04/15 1730 02/04/15 1745  BP: 137/85 139/85 135/80 138/93  Pulse: 76 78 77 88  Temp:      TempSrc:      Resp:      Height:      Weight:        Physical Exam  Constitutional: She is oriented to person, place, and time. She appears well-developed and well-nourished. No distress.  HENT:  Head: Normocephalic.  Neck: Normal range of motion. Neck supple.  Cardiovascular: Normal rate, regular rhythm and normal heart sounds.  Exam reveals no gallop and no friction rub.   No murmur heard. Respiratory: Effort normal and breath sounds normal. No respiratory distress. She has no wheezes. She has no rales.  GI: Soft. She exhibits no distension. There is no tenderness. There is no rebound and no guarding.  Musculoskeletal: Normal range of motion. She exhibits edema (trace).  Neurological: She is alert and oriented to person, place, and time. She has normal reflexes. She displays normal reflexes. She exhibits normal muscle tone.  Skin: Skin is warm and dry.  Psychiatric: She has a normal mood and affect.   Fetal heart rate reactive Occasional contractons  MAU Course  Procedures  MDM Labs drawn and urine sent for evaluation of pregnancy related hypertension to rule out preeclampsia Results for orders placed or performed during the hospital encounter of 02/04/15 (from the past 72 hour(s))  Urinalysis, Routine w reflex microscopic (not at [facility])     Status: Abnormal   Collection Time: 02/04/15  3:23 PM  Result Value Ref Range   Color, Urine YELLOW YELLOW   APPearance CLEAR CLEAR   Specific Gravity, Urine <1.005 (L) 1.005 - 1.030   pH 6.5 5.0 - 8.0   Glucose, UA NEGATIVE NEGATIVE mg/dL   Hgb urine dipstick TRACE (A) NEGATIVE   Bilirubin Urine NEGATIVE NEGATIVE   Ketones, ur NEGATIVE NEGATIVE mg/dL   Protein, ur NEGATIVE NEGATIVE mg/dL   Urobilinogen, UA 0.2 0.0 - 1.0 mg/dL   Nitrite NEGATIVE  NEGATIVE   Leukocytes, UA NEGATIVE NEGATIVE  Protein / creatinine ratio, urine     Status: Abnormal   Collection Time: 02/04/15  3:23 PM  Result Value Ref Range   Creatinine, Urine 28.00 mg/dL   Total Protein, Urine 7 mg/dL    Comment: NO NORMAL RANGE ESTABLISHED FOR THIS TEST   Protein Creatinine Ratio 0.25 (H) 0.00 - 0.15 mg/mg[Cre]  Urine microscopic-add on     Status: None   Collection Time: 02/04/15  3:23 PM  Result Value Ref Range   Squamous Epithelial / LPF RARE RARE   WBC, UA 0-2 <3 WBC/hpf   RBC / HPF 0-2 <3 RBC/hpf   Bacteria, UA RARE RARE  Comprehensive metabolic panel     Status: Abnormal  Collection Time: 02/04/15  4:15 PM  Result Value Ref Range   Sodium 136 135 - 145 mmol/L   Potassium 2.9 (L) 3.5 - 5.1 mmol/L   Chloride 107 101 - 111 mmol/L   CO2 23 22 - 32 mmol/L   Glucose, Bld 84 65 - 99 mg/dL   BUN <5 (L) 6 - 20 mg/dL    Comment: REPEATED TO VERIFY   Creatinine, Ser 0.68 0.44 - 1.00 mg/dL   Calcium 8.3 (L) 8.9 - 10.3 mg/dL   Total Protein 5.7 (L) 6.5 - 8.1 g/dL   Albumin 2.8 (L) 3.5 - 5.0 g/dL   AST 19 15 - 41 U/L   ALT 8 (L) 14 - 54 U/L   Alkaline Phosphatase 120 38 - 126 U/L   Total Bilirubin 0.3 0.3 - 1.2 mg/dL   GFR calc non Af Amer >60 >60 mL/min   GFR calc Af Amer >60 >60 mL/min    Comment: (NOTE) The eGFR has been calculated using the CKD EPI equation. This calculation has not been validated in all clinical situations. eGFR's persistently <60 mL/min signify possible Chronic Kidney Disease.    Anion gap 6 5 - 15  CBC     Status: Abnormal   Collection Time: 02/04/15  4:15 PM  Result Value Ref Range   WBC 10.9 (H) 4.0 - 10.5 K/uL   RBC 3.64 (L) 3.87 - 5.11 MIL/uL   Hemoglobin 10.0 (L) 12.0 - 15.0 g/dL   HCT 29.6 (L) 36.0 - 46.0 %   MCV 81.3 78.0 - 100.0 fL   MCH 27.5 26.0 - 34.0 pg   MCHC 33.8 30.0 - 36.0 g/dL   RDW 15.5 11.5 - 15.5 %   Platelets 270 150 - 400 K/uL     Assessment and Plan  A;  SIUP at [redacted]w[redacted]d        Pregnancy induced  hypertension       Normal labs and PR/CR ratio.  Mild preeclampsia.        Labile hypertension  P:  Consulted Dr Elonda Husky       Discharge home       Encouraged her to measure BP only 3-4 times per day        Preeclampsia precautions        Follow up in office for BP measurement.           [facility] 02/04/2015, 5:47 PM

## 2015-02-04 NOTE — Plan of Care (Signed)
Pt. Urine in lab 

## 2015-02-05 ENCOUNTER — Encounter: Payer: Medicaid Other | Admitting: Family Medicine

## 2015-02-06 ENCOUNTER — Ambulatory Visit (INDEPENDENT_AMBULATORY_CARE_PROVIDER_SITE_OTHER): Payer: Medicaid Other | Admitting: Family Medicine

## 2015-02-06 VITALS — BP 134/75 | HR 76 | Wt 185.0 lb

## 2015-02-06 DIAGNOSIS — Z36 Encounter for antenatal screening of mother: Secondary | ICD-10-CM

## 2015-02-06 DIAGNOSIS — O133 Gestational [pregnancy-induced] hypertension without significant proteinuria, third trimester: Secondary | ICD-10-CM | POA: Diagnosis not present

## 2015-02-06 DIAGNOSIS — Z3403 Encounter for supervision of normal first pregnancy, third trimester: Secondary | ICD-10-CM

## 2015-02-06 NOTE — Patient Instructions (Signed)
Preeclampsia and Eclampsia Preeclampsia is a serious condition that develops only during pregnancy. It is also called toxemia of pregnancy. This condition causes high blood pressure along with other symptoms, such as swelling and headaches. These may develop as the condition gets worse. Preeclampsia may occur 20 weeks or later into your pregnancy.  Diagnosing and treating preeclampsia early is very important. If not treated early, it can cause serious problems for you and your baby. One problem it can lead to is eclampsia, which is a condition that causes muscle jerking or shaking (convulsions) in the mother. Delivering your baby is the best treatment for preeclampsia or eclampsia.  RISK FACTORS The cause of preeclampsia is not known. You may be more likely to develop preeclampsia if you have certain risk factors. These include:   Being pregnant for the first time.  Having preeclampsia in a past pregnancy.  Having a family history of preeclampsia.  Having high blood pressure.  Being pregnant with twins or triplets.  Being 35 or older.  Being African American.  Having kidney disease or diabetes.  Having medical conditions such as lupus or blood diseases.  Being very overweight (obese). SIGNS AND SYMPTOMS  The earliest signs of preeclampsia are:  High blood pressure.  Increased protein in your urine. Your health care provider will check for this at every prenatal visit. Other symptoms that can develop include:   Severe headaches.  Sudden weight gain.  Swelling of your hands, face, legs, and feet.  Feeling sick to your stomach (nauseous) and throwing up (vomiting).  Vision problems (blurred or double vision).  Numbness in your face, arms, legs, and feet.  Dizziness.  Slurred speech.  Sensitivity to bright lights.  Abdominal pain. DIAGNOSIS  There are no screening tests for preeclampsia. Your health care provider will ask you about symptoms and check for signs of  preeclampsia during your prenatal visits. You may also have tests, including:  Urine testing.  Blood testing.  Checking your baby's heart rate.  Checking the health of your baby and your placenta using images created with sound waves (ultrasound). TREATMENT  You can work out the best treatment approach together with your health care provider. It is very important to keep all prenatal appointments. If you have an increased risk of preeclampsia, you may need more frequent prenatal exams.  Your health care provider may prescribe bed rest.  You may have to eat as little salt as possible.  You may need to take medicine to lower your blood pressure if the condition does not respond to more conservative measures.  You may need to stay in the hospital if your condition is severe. There, treatment will focus on controlling your blood pressure and fluid retention. You may also need to take medicine to prevent seizures.  If the condition gets worse, your baby may need to be delivered early to protect you and the baby. You may have your labor started with medicine (be induced), or you may have a cesarean delivery.  Preeclampsia usually goes away after the baby is born. HOME CARE INSTRUCTIONS   Only take over-the-counter or prescription medicines as directed by your health care provider.  Lie on your left side while resting. This keeps pressure off your baby.  Elevate your feet while resting.  Get regular exercise. Ask your health care provider what type of exercise is safe for you.  Avoid caffeine and alcohol.  Do not smoke.  Drink 6-8 glasses of water every day.  Eat a balanced diet   that is low in salt. Do not add salt to your food.  Avoid stressful situations as much as possible.  Get plenty of rest and sleep.  Keep all prenatal appointments and tests as scheduled. SEEK MEDICAL CARE IF:  You are gaining more weight than expected.  You have any headaches, abdominal pain, or  nausea.  You are bruising more than usual.  You feel dizzy or light-headed. SEEK IMMEDIATE MEDICAL CARE IF:   You develop sudden or severe swelling anywhere in your body. This usually happens in the legs.  You gain 5 lb (2.3 kg) or more in a week.  You have a severe headache, dizziness, problems with your vision, or confusion.  You have severe abdominal pain.  You have lasting nausea or vomiting.  You have a seizure.  You have trouble moving any part of your body.  You develop numbness in your body.  You have trouble speaking.  You have any abnormal bleeding.  You develop a stiff neck.  You pass out. MAKE SURE YOU:   Understand these instructions.  Will watch your condition.  Will get help right away if you are not doing well or get worse. Document Released: 07/22/2000 Document Revised: 07/30/2013 Document Reviewed: 05/17/2013 [employer] Patient Information 2015 [employer]. This information is not intended to replace advice given to you by your health care provider. Make sure you discuss any questions you have with your health care provider.  Breastfeeding Deciding to breastfeed is one of the best choices you can make for you and your baby. A change in hormones during pregnancy causes your breast tissue to grow and increases the number and size of your milk ducts. These hormones also allow proteins, sugars, and fats from your blood supply to make breast milk in your milk-producing glands. Hormones prevent breast milk from being released before your baby is born as well as prompt milk flow after birth. Once breastfeeding has begun, thoughts of your baby, as well as his or her sucking or crying, can stimulate the release of milk from your milk-producing glands.  BENEFITS OF BREASTFEEDING For Your Baby  Your first milk (colostrum) helps your baby's digestive system function better.   There are antibodies in your milk that help your baby fight off infections.   Your  baby has a lower incidence of asthma, allergies, and sudden infant death syndrome.   The nutrients in breast milk are better for your baby than infant formulas and are designed uniquely for your baby's needs.   Breast milk improves your baby's brain development.   Your baby is less likely to develop other conditions, such as childhood obesity, asthma, or type 2 diabetes mellitus.  For You   Breastfeeding helps to create a very special bond between you and your baby.   Breastfeeding is convenient. Breast milk is always available at the correct temperature and costs nothing.   Breastfeeding helps to burn calories and helps you lose the weight gained during pregnancy.   Breastfeeding makes your uterus contract to its prepregnancy size faster and slows bleeding (lochia) after you give birth.   Breastfeeding helps to lower your risk of developing type 2 diabetes mellitus, osteoporosis, and breast or ovarian cancer later in life. SIGNS THAT YOUR BABY IS HUNGRY Early Signs of Hunger  Increased alertness or activity.  Stretching.  Movement of the head from side to side.  Movement of the head and opening of the mouth when the corner of the mouth or cheek is stroked (rooting).    Increased sucking sounds, smacking lips, cooing, sighing, or squeaking.  Hand-to-mouth movements.  Increased sucking of fingers or hands. Late Signs of Hunger  Fussing.  Intermittent crying. Extreme Signs of Hunger Signs of extreme hunger will require calming and consoling before your baby will be able to breastfeed successfully. Do not wait for the following signs of extreme hunger to occur before you initiate breastfeeding:   Restlessness.  A loud, strong cry.   Screaming. BREASTFEEDING BASICS Breastfeeding Initiation  Find a comfortable place to sit or lie down, with your neck and back well supported.  Place a pillow or rolled up blanket under your baby to bring him or her to the level of  your breast (if you are seated). Nursing pillows are specially designed to help support your arms and your baby while you breastfeed.  Make sure that your baby's abdomen is facing your abdomen.   Gently massage your breast. With your fingertips, massage from your chest wall toward your nipple in a circular motion. This encourages milk flow. You may need to continue this action during the feeding if your milk flows slowly.  Support your breast with 4 fingers underneath and your thumb above your nipple. Make sure your fingers are well away from your nipple and your baby's mouth.   Stroke your baby's lips gently with your finger or nipple.   When your baby's mouth is open wide enough, quickly bring your baby to your breast, placing your entire nipple and as much of the colored area around your nipple (areola) as possible into your baby's mouth.   More areola should be visible above your baby's upper lip than below the lower lip.   Your baby's tongue should be between his or her lower gum and your breast.   Ensure that your baby's mouth is correctly positioned around your nipple (latched). Your baby's lips should create a seal on your breast and be turned out (everted).  It is common for your baby to suck about 2-3 minutes in order to start the flow of breast milk. Latching Teaching your baby how to latch on to your breast properly is very important. An improper latch can cause nipple pain and decreased milk supply for you and poor weight gain in your baby. Also, if your baby is not latched onto your nipple properly, he or she may swallow some air during feeding. This can make your baby fussy. Burping your baby when you switch breasts during the feeding can help to get rid of the air. However, teaching your baby to latch on properly is still the best way to prevent fussiness from swallowing air while breastfeeding. Signs that your baby has successfully latched on to your nipple:    Silent  tugging or silent sucking, without causing you pain.   Swallowing heard between every 3-4 sucks.    Muscle movement above and in front of his or her ears while sucking.  Signs that your baby has not successfully latched on to nipple:   Sucking sounds or smacking sounds from your baby while breastfeeding.  Nipple pain. If you think your baby has not latched on correctly, slip your finger into the corner of your baby's mouth to break the suction and place it between your baby's gums. Attempt breastfeeding initiation again. Signs of Successful Breastfeeding Signs from your baby:   A gradual decrease in the number of sucks or complete cessation of sucking.   Falling asleep.   Relaxation of his or her body.     Retention of a small amount of milk in his or her mouth.   Letting go of your breast by himself or herself. Signs from you:  Breasts that have increased in firmness, weight, and size 1-3 hours after feeding.   Breasts that are softer immediately after breastfeeding.  Increased milk volume, as well as a change in milk consistency and color by the fifth day of breastfeeding.   Nipples that are not sore, cracked, or bleeding. Signs That Your Baby is Getting Enough Milk  Wetting at least 3 diapers in a 24-hour period. The urine should be clear and pale yellow by age 5 days.  At least 3 stools in a 24-hour period by age 5 days. The stool should be soft and yellow.  At least 3 stools in a 24-hour period by age 7 days. The stool should be seedy and yellow.  No loss of weight greater than 10% of birth weight during the first 3 days of age.  Average weight gain of 4-7 ounces (113-198 g) per week after age 4 days.  Consistent daily weight gain by age 5 days, without weight loss after the age of 2 weeks. After a feeding, your baby may spit up a small amount. This is common. BREASTFEEDING FREQUENCY AND DURATION Frequent feeding will help you make more milk and can prevent  sore nipples and breast engorgement. Breastfeed when you feel the need to reduce the fullness of your breasts or when your baby shows signs of hunger. This is called "breastfeeding on demand." Avoid introducing a pacifier to your baby while you are working to establish breastfeeding (the first 4-6 weeks after your baby is born). After this time you may choose to use a pacifier. Research has shown that pacifier use during the first year of a baby's life decreases the risk of sudden infant death syndrome (SIDS). Allow your baby to feed on each breast as long as he or she wants. Breastfeed until your baby is finished feeding. When your baby unlatches or falls asleep while feeding from the first breast, offer the second breast. Because newborns are often sleepy in the first few weeks of life, you may need to awaken your baby to get him or her to feed. Breastfeeding times will vary from baby to baby. However, the following rules can serve as a guide to help you ensure that your baby is properly fed:  Newborns (babies 4 weeks of age or younger) may breastfeed every 1-3 hours.  Newborns should not go longer than 3 hours during the day or 5 hours during the night without breastfeeding.  You should breastfeed your baby a minimum of 8 times in a 24-hour period until you begin to introduce solid foods to your baby at around 6 months of age. BREAST MILK PUMPING Pumping and storing breast milk allows you to ensure that your baby is exclusively fed your breast milk, even at times when you are unable to breastfeed. This is especially important if you are going back to work while you are still breastfeeding or when you are not able to be present during feedings. Your lactation consultant can give you guidelines on how long it is safe to store breast milk.  A breast pump is a machine that allows you to pump milk from your breast into a sterile bottle. The pumped breast milk can then be stored in a refrigerator or freezer.  Some breast pumps are operated by hand, while others use electricity. Ask your lactation consultant which type   will work best for you. Breast pumps can be purchased, but some hospitals and breastfeeding support groups lease breast pumps on a monthly basis. A lactation consultant can teach you how to hand express breast milk, if you prefer not to use a pump.  CARING FOR YOUR BREASTS WHILE YOU BREASTFEED Nipples can become dry, cracked, and sore while breastfeeding. The following recommendations can help keep your breasts moisturized and healthy:  Avoid using soap on your nipples.   Wear a supportive bra. Although not required, special nursing bras and tank tops are designed to allow access to your breasts for breastfeeding without taking off your entire bra or top. Avoid wearing underwire-style bras or extremely tight bras.  Air dry your nipples for 3-4minutes after each feeding.   Use only cotton bra pads to absorb leaked breast milk. Leaking of breast milk between feedings is normal.   Use lanolin on your nipples after breastfeeding. Lanolin helps to maintain your skin's normal moisture barrier. If you use pure lanolin, you do not need to wash it off before feeding your baby again. Pure lanolin is not toxic to your baby. You may also hand express a few drops of breast milk and gently massage that milk into your nipples and allow the milk to air dry. In the first few weeks after giving birth, some women experience extremely full breasts (engorgement). Engorgement can make your breasts feel heavy, warm, and tender to the touch. Engorgement peaks within 3-5 days after you give birth. The following recommendations can help ease engorgement:  Completely empty your breasts while breastfeeding or pumping. You may want to start by applying warm, moist heat (in the shower or with warm water-soaked hand towels) just before feeding or pumping. This increases circulation and helps the milk flow. If your baby  does not completely empty your breasts while breastfeeding, pump any extra milk after he or she is finished.  Wear a snug bra (nursing or regular) or tank top for 1-2 days to signal your body to slightly decrease milk production.  Apply ice packs to your breasts, unless this is too uncomfortable for you.  Make sure that your baby is latched on and positioned properly while breastfeeding. If engorgement persists after 48 hours of following these recommendations, contact your health care provider or a lactation consultant. OVERALL HEALTH CARE RECOMMENDATIONS WHILE BREASTFEEDING  Eat healthy foods. Alternate between meals and snacks, eating 3 of each per day. Because what you eat affects your breast milk, some of the foods may make your baby more irritable than usual. Avoid eating these foods if you are sure that they are negatively affecting your baby.  Drink milk, fruit juice, and water to satisfy your thirst (about 10 glasses a day).   Rest often, relax, and continue to take your prenatal vitamins to prevent fatigue, stress, and anemia.  Continue breast self-awareness checks.  Avoid chewing and smoking tobacco.  Avoid alcohol and drug use. Some medicines that may be harmful to your baby can pass through breast milk. It is important to ask your health care provider before taking any medicine, including all over-the-counter and prescription medicine as well as vitamin and herbal supplements. It is possible to become pregnant while breastfeeding. If birth control is desired, ask your health care provider about options that will be safe for your baby. SEEK MEDICAL CARE IF:   You feel like you want to stop breastfeeding or have become frustrated with breastfeeding.  You have painful breasts or nipples.  Your   nipples are cracked or bleeding.  Your breasts are red, tender, or warm.  You have a swollen area on either breast.  You have a fever or chills.  You have nausea or  vomiting.  You have drainage other than breast milk from your nipples.  Your breasts do not become full before feedings by the fifth day after you give birth.  You feel sad and depressed.  Your baby is too sleepy to eat well.  Your baby is having trouble sleeping.   Your baby is wetting less than 3 diapers in a 24-hour period.  Your baby has less than 3 stools in a 24-hour period.  Your baby's skin or the white part of his or her eyes becomes yellow.   Your baby is not gaining weight by 5 days of age. SEEK IMMEDIATE MEDICAL CARE IF:   Your baby is overly tired (lethargic) and does not want to wake up and feed.  Your baby develops an unexplained fever. Document Released: 07/25/2005 Document Revised: 07/30/2013 Document Reviewed: 01/16/2013 [employer] Patient Information 2015 [employer]. This information is not intended to replace advice given to you by your health care provider. Make sure you discuss any questions you have with your health care provider.  

## 2015-02-06 NOTE — Progress Notes (Signed)
Subjective:  Chelsea Ferguson is a 19 y.o. G1P0 at 6224w1d being seen today for ongoing prenatal care.  Patient reports headache and elevated BP yesterday.  Contractions: Irregular.  Vag. Bleeding: None. Movement: Present. Denies leaking of fluid.   The following portions of the patient's history were reviewed and updated as appropriate: allergies, current medications, past family history, past medical history, past social history, past surgical history and problem list.   Objective:   Filed Vitals:   02/06/15 0958  BP: 134/75  Pulse: 76  Weight: 185 lb (83.915 kg)    Fetal Status: Fetal Heart Rate (bpm): 143 Fundal Height: 34 cm Movement: Present     General:  Alert, oriented and cooperative. Patient is in no acute distress.  Skin: Skin is warm and dry. No rash noted.   Cardiovascular: Normal heart rate noted  Respiratory: Normal respiratory effort, no problems with respiration noted  Abdomen: Soft, gravid, appropriate for gestational age. Pain/Pressure: Absent     Vaginal: Vag. Bleeding: None.    Vag D/C Character: Thin  Cervix: Exam revealed1.5/80/-2        Extremities: Normal range of motion.  Edema: Moderate pitting, indentation subsides rapidly  Mental Status: Normal mood and affect. Normal behavior. Normal judgment and thought content.   Urinalysis: Urine Protein: Negative Urine Glucose: Negative NST reviewed and reactive.  Assessment and Plan:  Pregnancy: G1P0 at 7824w1d  1. Encounter for supervision of normal first pregnancy in third trimester  - GC/Chlamydia Probe Amp - Culture, beta strep (group b only)  2. Gestational hypertension w/o significant proteinuria in 3rd trimester S/Sx's of pre-eclampsia reviewed.  Continue 2x/wk testing. IOL at 37 wks   Term labor symptoms and general obstetric precautions including but not limited to vaginal bleeding, contractions, leaking of fluid and fetal movement were reviewed in detail with the patient.  Please refer to After  Visit Summary for other counseling recommendations.   Return in 1 week (on 02/13/2015) for OB visit and NST, NST only in 4 days.   Reva Boresanya S Pratt, MD

## 2015-02-08 ENCOUNTER — Encounter (HOSPITAL_COMMUNITY): Payer: Self-pay

## 2015-02-08 ENCOUNTER — Inpatient Hospital Stay (HOSPITAL_COMMUNITY)
Admission: AD | Admit: 2015-02-08 | Discharge: 2015-02-13 | DRG: 774 | Disposition: A | Discharge status: Home / Self Care | Payer: Medicaid Other | Source: Ambulatory Visit | Attending: Family Medicine | Admitting: Family Medicine

## 2015-02-08 DIAGNOSIS — O1413 Severe pre-eclampsia, third trimester: Principal | ICD-10-CM | POA: Diagnosis present

## 2015-02-08 DIAGNOSIS — O1410 Severe pre-eclampsia, unspecified trimester: Secondary | ICD-10-CM | POA: Diagnosis present

## 2015-02-08 DIAGNOSIS — O4700 False labor before 37 completed weeks of gestation, unspecified trimester: Secondary | ICD-10-CM

## 2015-02-08 DIAGNOSIS — O0903 Supervision of pregnancy with history of infertility, third trimester: Secondary | ICD-10-CM

## 2015-02-08 DIAGNOSIS — O3483 Maternal care for other abnormalities of pelvic organs, third trimester: Secondary | ICD-10-CM | POA: Diagnosis present

## 2015-02-08 DIAGNOSIS — E282 Polycystic ovarian syndrome: Secondary | ICD-10-CM | POA: Diagnosis present

## 2015-02-08 DIAGNOSIS — O9962 Diseases of the digestive system complicating childbirth: Secondary | ICD-10-CM | POA: Diagnosis present

## 2015-02-08 DIAGNOSIS — Z3A35 35 weeks gestation of pregnancy: Secondary | ICD-10-CM | POA: Diagnosis present

## 2015-02-08 DIAGNOSIS — K589 Irritable bowel syndrome without diarrhea: Secondary | ICD-10-CM | POA: Diagnosis present

## 2015-02-08 NOTE — MAU Provider Note (Signed)
History     CSN: [id number]  Arrival date and time: 02/08/15 2314   First Provider Initiated Contact with Patient 02/08/15 2355      No chief complaint on file.  HPI  OB History    Gravida Para Term Preterm AB TAB SAB Ectopic Multiple Living   1         0       Patient is 19 y.o. G1P0 1437w3d here with complaints of feeling as though there is a balloon in her vagina all day.  She reports intermittent back pain and lower abdominal cramping that she thinks may be contractions all day as well.  +FM, denies LOF, VB, vaginal discharge.  She was admitted for evaluation of preeclampsia the week of 6/20 and discharged home on 6/22. She had normal labs except for an elevation of AST to 57. She received Betamethasone.   [facility] BP was 134/75 in clinic on 7/1.  Husband reports that he has been checking her BP at home for the last week or so.  Yesterday AM was 17-s/100 and this AM was 180s/100s.  Reprots chronic headahce with spots in her vision, as well as waxing and waning LE edema.  She had a 30 min episode of R sided chest/upper abdominal pain earlier this evening that resolved with Zantac.     Past Medical History  Diagnosis Date  . IBS (irritable bowel syndrome)   . Infertility, female   . PCOS (polycystic ovarian syndrome)   . Headache     Past Surgical History  Procedure Laterality Date  . No past surgeries      Family History  Problem Relation Age of Onset  . Diabetes Maternal Grandmother   . Hypertension Maternal Grandmother   . Hypertension Paternal Grandmother     History  Substance Use Topics  . Smoking status: Never Smoker   . Smokeless tobacco: Never Used  . Alcohol Use: No    Allergies: No Known Allergies  Prescriptions prior to admission  Medication Sig Dispense Refill Last Dose  . acetaminophen (TYLENOL) 500 MG tablet Take 1,000 mg by mouth every 6 (six) hours as needed for mild pain, moderate pain or headache.   02/07/2015 at Unknown  time  . diphenhydramine-acetaminophen (TYLENOL PM) 25-500 MG TABS Take 2 tablets by mouth at bedtime as needed (sleep).    02/07/2015 at Unknown time  . Doxylamine-Pyridoxine 10-10 MG TBEC Take 2 tablets by mouth at bedtime. Take 2 tablets at bedtime. If symptoms persist, add 1 tab in the AM starting on day 3. If symptoms persist, add 1 tab in the PM starting day 4. 90 tablet 1 02/07/2015 at Unknown time  . ferrous sulfate 325 (65 FE) MG tablet Take 1 tablet (325 mg total) by mouth 3 (three) times daily with meals. (Patient taking differently: Take 325 mg by mouth daily with breakfast. ) 90 tablet 3 Past Week at Unknown time  . Prenatal Vit-Fe Fumarate-FA (PRENATAL MULTIVITAMIN) TABS tablet Take 1 tablet by mouth daily at 12 noon.   02/07/2015 at Unknown time  . ranitidine (ZANTAC) 150 MG capsule Take 1 capsule (150 mg total) by mouth daily. 60 capsule 2 02/08/2015 at Unknown time    Review of Systems  Constitutional: Negative for fever, chills and weight loss.  HENT: Negative for hearing loss.   Eyes: Negative for blurred vision, double vision, photophobia and pain.  Respiratory: Negative for cough and shortness of breath.   Cardiovascular: Positive for chest pain  and leg swelling. Negative for palpitations.  Gastrointestinal: Positive for heartburn. Negative for nausea, vomiting and abdominal pain.  Genitourinary: Negative for dysuria, urgency and frequency.  Musculoskeletal: Positive for back pain. Negative for myalgias and falls.  Skin: Negative for itching and rash.  Neurological: Positive for headaches. Negative for dizziness, tremors, focal weakness, seizures and loss of consciousness.  Endo/Heme/Allergies: Negative.   Psychiatric/Behavioral: Negative.    Physical Exam   Blood pressure 164/88, pulse 79, temperature 98.6 F (37 C), temperature source Oral, resp. rate 18, height  (1.549 m), weight 187 lb (84.823 kg), last menstrual period 06/07/2014, SpO2 99 %.  Physical Exam   Constitutional: She is oriented to person, place, and time. She appears well-developed and well-nourished. No distress.  HENT:  Head: Normocephalic and atraumatic.  Eyes: EOM are normal. No scleral icterus.  Neck: Normal range of motion. Neck supple.  Cardiovascular: Normal rate, regular rhythm, normal heart sounds and intact distal pulses.   No murmur heard. Respiratory: Effort normal and breath sounds normal. No respiratory distress. She has no wheezes. She exhibits no tenderness.  GI: There is no tenderness. There is no rebound and no guarding.  gravid  Genitourinary: Vagina normal.  GYN:  External genitalia within normal limits.  Vaginal mucosa pink, moist, normal rugae.  Nonfriable cervix without lesions, no pooling or bleeding noted on speculum exam.  Cervix: 2.5/80/ballotable, vertex  Musculoskeletal: Normal range of motion. She exhibits edema. She exhibits no tenderness.  Neurological: She is alert and oriented to person, place, and time. She displays normal reflexes. No cranial nerve deficit. She exhibits normal muscle tone.  Skin: Skin is warm and dry. No rash noted. No erythema.  Psychiatric: She has a normal mood and affect. Her behavior is normal. Thought content normal.    MAU Course  Procedures  MDM FHR: Cat 1, baseline 140, good variability, +accels, no decels Toco: Irregular UCs q3-48m, mild BPs in severe range, UP:C 0.42 Other HELLP labs pending  Assessment and Plan  Will admit for IOL 2/2 severe pre-eclampsia - See H&P for more details  Shirlee Latch 02/08/2015, 11:56 PM

## 2015-02-08 NOTE — MAU Note (Signed)
Pt presents to triage with complaint of feeling like there is "a bubble in my vagina", states it feels like something is coming out and there is a lot of pressure. Taken to room 6 for eval

## 2015-02-09 ENCOUNTER — Encounter (HOSPITAL_COMMUNITY): Payer: Self-pay | Admitting: *Deleted

## 2015-02-09 DIAGNOSIS — Z3A35 35 weeks gestation of pregnancy: Secondary | ICD-10-CM | POA: Diagnosis present

## 2015-02-09 DIAGNOSIS — K589 Irritable bowel syndrome without diarrhea: Secondary | ICD-10-CM | POA: Diagnosis present

## 2015-02-09 DIAGNOSIS — O3483 Maternal care for other abnormalities of pelvic organs, third trimester: Secondary | ICD-10-CM | POA: Diagnosis present

## 2015-02-09 DIAGNOSIS — E282 Polycystic ovarian syndrome: Secondary | ICD-10-CM | POA: Diagnosis present

## 2015-02-09 DIAGNOSIS — O1410 Severe pre-eclampsia, unspecified trimester: Secondary | ICD-10-CM

## 2015-02-09 DIAGNOSIS — O0903 Supervision of pregnancy with history of infertility, third trimester: Secondary | ICD-10-CM | POA: Diagnosis not present

## 2015-02-09 DIAGNOSIS — O1413 Severe pre-eclampsia, third trimester: Secondary | ICD-10-CM | POA: Diagnosis present

## 2015-02-09 DIAGNOSIS — O9962 Diseases of the digestive system complicating childbirth: Secondary | ICD-10-CM | POA: Diagnosis present

## 2015-02-09 HISTORY — DX: Severe pre-eclampsia, unspecified trimester: O14.10

## 2015-02-09 LAB — COMPREHENSIVE METABOLIC PANEL
ALBUMIN: 3 g/dL — AB (ref 3.5–5.0)
ALK PHOS: 134 U/L — AB (ref 38–126)
ALT: 9 U/L — AB (ref 14–54)
AST: 18 U/L (ref 15–41)
Anion gap: 8 (ref 5–15)
BILIRUBIN TOTAL: 0.3 mg/dL (ref 0.3–1.2)
BUN: <5 mg/dL — ABNORMAL LOW (ref 6–20)
CALCIUM: 8.6 mg/dL — AB (ref 8.9–10.3)
CO2: 22 mmol/L (ref 22–32)
Chloride: 107 mmol/L (ref 101–111)
Creatinine, Ser: 0.74 mg/dL (ref 0.44–1.00)
GFR calc Af Amer: >60 mL/min (ref >60)
Glucose, Bld: 91 mg/dL (ref 65–99)
Potassium: 3.1 mmol/L — ABNORMAL LOW (ref 3.5–5.1)
SODIUM: 137 mmol/L (ref 135–145)
Total Protein: 6.3 g/dL — ABNORMAL LOW (ref 6.5–8.1)

## 2015-02-09 LAB — PROTEIN / CREATININE RATIO, URINE
Creatinine, Urine: 60 mg/dL
Protein Creatinine Ratio: 0.42 mg/mg{Cre} — ABNORMAL HIGH (ref 0.00–0.15)
Total Protein, Urine: 25 mg/dL

## 2015-02-09 LAB — GROUP B STREP BY PCR: Group B strep by PCR: NEGATIVE

## 2015-02-09 LAB — CBC
HCT: 30.5 % — ABNORMAL LOW (ref 36.0–46.0)
HEMOGLOBIN: 10.3 g/dL — AB (ref 12.0–15.0)
MCH: 27.5 pg (ref 26.0–34.0)
MCHC: 33.8 g/dL (ref 30.0–36.0)
MCV: 81.3 fL (ref 78.0–100.0)
Platelets: 273 10*3/uL (ref 150–400)
RBC: 3.75 MIL/uL — ABNORMAL LOW (ref 3.87–5.11)
RDW: 15.5 % (ref 11.5–15.5)
WBC: 14.3 10*3/uL — ABNORMAL HIGH (ref 4.0–10.5)

## 2015-02-09 LAB — OB RESULTS CONSOLE GBS: GBS: NEGATIVE

## 2015-02-09 LAB — RPR: RPR: NONREACTIVE

## 2015-02-09 MED ORDER — MISOPROSTOL 200 MCG PO TABS
50.0000 ug | ORAL_TABLET | ORAL | Status: DC | PRN
  Administered 2015-02-09: 50 ug via ORAL
  Filled 2015-02-09: qty 0.5

## 2015-02-09 MED ORDER — MISOPROSTOL 25 MCG QUARTER TABLET: 25.0000 ug | ORAL_TABLET | ORAL | Status: DC | PRN

## 2015-02-09 MED ORDER — LACTATED RINGERS IV SOLN
2.0000 g/h | INTRAVENOUS | Status: AC
  Administered 2015-02-10 – 2015-02-11 (×2): 2 g/h via INTRAVENOUS
  Filled 2015-02-09 (×3): qty 80

## 2015-02-09 MED ORDER — CITRIC ACID-SODIUM CITRATE 334-500 MG/5ML PO SOLN
30.0000 mL | ORAL | Status: DC | PRN
  Filled 2015-02-09: qty 30

## 2015-02-09 MED ORDER — HYDRALAZINE HCL 20 MG/ML IJ SOLN: 10.0000 mg | Freq: Once | INTRAMUSCULAR | Status: AC | PRN

## 2015-02-09 MED ORDER — MAGNESIUM SULFATE BOLUS VIA INFUSION
4.0000 g | Freq: Once | INTRAVENOUS | Status: DC
  Filled 2015-02-09: qty 500

## 2015-02-09 MED ORDER — OXYTOCIN 40 UNITS IN LACTATED RINGERS INFUSION - SIMPLE MED
62.5000 mL/h | INTRAVENOUS | Status: DC
  Filled 2015-02-09 (×2): qty 1000

## 2015-02-09 MED ORDER — FENTANYL CITRATE (PF) 100 MCG/2ML IJ SOLN
100.0000 ug | INTRAMUSCULAR | Status: DC | PRN
  Administered 2015-02-09: 100 ug via INTRAVENOUS

## 2015-02-09 MED ORDER — PROMETHAZINE HCL 25 MG/ML IJ SOLN
25.0000 mg | Freq: Four times a day (QID) | INTRAMUSCULAR | Status: DC | PRN
  Administered 2015-02-09: 25 mg via INTRAVENOUS
  Filled 2015-02-09: qty 1

## 2015-02-09 MED ORDER — OXYTOCIN BOLUS FROM INFUSION
500.0000 mL | INTRAVENOUS | Status: DC
  Administered 2015-02-10 (×2): 500 mL via INTRAVENOUS

## 2015-02-09 MED ORDER — FLEET ENEMA 7-19 GM/118ML RE ENEM: 1.0000 | ENEMA | RECTAL | Status: DC | PRN

## 2015-02-09 MED ORDER — OXYTOCIN 40 UNITS IN LACTATED RINGERS INFUSION - SIMPLE MED
1.0000 m[IU]/min | INTRAVENOUS | Status: DC
  Administered 2015-02-09 (×2): 2 m[IU]/min via INTRAVENOUS
  Administered 2015-02-10: 32 m[IU]/min via INTRAVENOUS
  Filled 2015-02-09: qty 1000

## 2015-02-09 MED ORDER — MAGNESIUM SULFATE 50 % IJ SOLN
2.0000 g/h | INTRAVENOUS | Status: DC
  Administered 2015-02-09: 4 g/h via INTRAVENOUS
  Administered 2015-02-09: 2 g/h via INTRAVENOUS
  Filled 2015-02-09 (×2): qty 80

## 2015-02-09 MED ORDER — TERBUTALINE SULFATE 1 MG/ML IJ SOLN
0.2500 mg | Freq: Once | INTRAMUSCULAR | Status: AC | PRN
Start: 2015-02-09 — End: 2015-02-09

## 2015-02-09 MED ORDER — ACETAMINOPHEN 325 MG PO TABS
650.0000 mg | ORAL_TABLET | ORAL | Status: DC | PRN
  Administered 2015-02-09 (×3): 650 mg via ORAL
  Filled 2015-02-09 (×2): qty 2

## 2015-02-09 MED ORDER — TERBUTALINE SULFATE 1 MG/ML IJ SOLN: 0.2500 mg | Freq: Once | INTRAMUSCULAR | Status: AC | PRN

## 2015-02-09 MED ORDER — NALBUPHINE HCL 10 MG/ML IJ SOLN
10.0000 mg | INTRAMUSCULAR | Status: DC | PRN
  Administered 2015-02-09: 10 mg via INTRAMUSCULAR
  Filled 2015-02-09: qty 1

## 2015-02-09 MED ORDER — OXYCODONE-ACETAMINOPHEN 5-325 MG PO TABS: 1.0000 | ORAL_TABLET | ORAL | Status: DC | PRN

## 2015-02-09 MED ORDER — LACTATED RINGERS IV SOLN
INTRAVENOUS | Status: DC
  Administered 2015-02-09 (×3): via INTRAVENOUS
  Administered 2015-02-10: 46 mL/h via INTRAVENOUS
  Administered 2015-02-10: 48 mL/h via INTRAVENOUS

## 2015-02-09 MED ORDER — OXYCODONE-ACETAMINOPHEN 5-325 MG PO TABS: 2.0000 | ORAL_TABLET | ORAL | Status: DC | PRN

## 2015-02-09 MED ORDER — LACTATED RINGERS IV SOLN
INTRAVENOUS | Status: DC
  Administered 2015-02-09: 125 mL/h via INTRAVENOUS

## 2015-02-09 MED ORDER — FENTANYL CITRATE (PF) 100 MCG/2ML IJ SOLN
INTRAMUSCULAR | Status: AC
  Administered 2015-02-09: 100 ug via INTRAVENOUS
  Filled 2015-02-09: qty 2

## 2015-02-09 MED ORDER — NALBUPHINE HCL 10 MG/ML IJ SOLN: 10.0000 mg | INTRAMUSCULAR | Status: DC | PRN

## 2015-02-09 MED ORDER — LABETALOL HCL 100 MG PO TABS
100.0000 mg | ORAL_TABLET | Freq: Two times a day (BID) | ORAL | Status: DC
  Administered 2015-02-09 – 2015-02-10 (×3): 100 mg via ORAL
  Filled 2015-02-09 (×6): qty 1

## 2015-02-09 MED ORDER — LACTATED RINGERS IV SOLN: 500.0000 mL | INTRAVENOUS | Status: DC | PRN

## 2015-02-09 MED ORDER — LABETALOL HCL 5 MG/ML IV SOLN
20.0000 mg | INTRAVENOUS | Status: DC | PRN
  Administered 2015-02-09 – 2015-02-10 (×2): 20 mg via INTRAVENOUS
  Filled 2015-02-09 (×2): qty 4

## 2015-02-09 MED ORDER — ONDANSETRON HCL 4 MG/2ML IJ SOLN
4.0000 mg | Freq: Four times a day (QID) | INTRAMUSCULAR | Status: DC | PRN
  Administered 2015-02-10: 4 mg via INTRAVENOUS
  Filled 2015-02-09: qty 2

## 2015-02-09 MED ORDER — LIDOCAINE HCL (PF) 1 % IJ SOLN
30.0000 mL | INTRAMUSCULAR | Status: AC | PRN
  Administered 2015-02-10: 30 mL via SUBCUTANEOUS
  Filled 2015-02-09: qty 30

## 2015-02-09 NOTE — Progress Notes (Signed)
Patient ID: Chelsea Ferguson, female   DOB: [birth year], 19 y.o.   MRN: [id number] Chelsea Ferguson is a 19 y.o. G1P0 at [redacted]w[redacted]d admitted for IOL indicated by preE with severe features  Subjective: Comfortable. Aware of mild cramps. FB fell out at 0700. H/A had improved with Tylenol last night, now returning: frontal, mild. No visual disturbance. No epigastric pain.   Objective: BP 143/81 mmHg  Pulse 94  Temp(Src) 97.8 F (36.6 C) (Oral)  Resp 20  Ht  (1.549 m)  Wt 84.823 kg (187 lb)  BMI 35.35 kg/m2  SpO2 99%  LMP 06/07/2014 (Approximate) Filed Vitals:   02/09/15 1000 02/09/15 1030 02/09/15 1100 02/09/15 1130  BP: 149/98 157/90 143/81 139/90  Pulse: 98 98 94 94  Temp:      TempSrc:      Resp:  20  20  Height:      Weight:      SpO2:       Fetal Heart FHR: 130 bpm, variability: moderate,  accelerations:  Present,  decelerations:  Absent   Contractions: q2-4  SVE:   Dilation: 4 Effacement (%): 80 Station: Ballotable Exam by:: Gerri Acre cx posterior, soft; cephalic  MgSO4 at 2 gm/hr Pitocin at 15mu  Results for orders placed or performed during the hospital encounter of 02/08/15 (from the past 24 hour(s))  OB RESULT CONSOLE Group B Strep     Status: None   Collection Time: 02/09/15 12:00 AM  Result Value Ref Range   GBS Negative   Protein / creatinine ratio, urine     Status: Abnormal   Collection Time: 02/09/15 12:15 AM  Result Value Ref Range   Creatinine, Urine 60.00 mg/dL   Total Protein, Urine 25 mg/dL   Protein Creatinine Ratio 0.42 (H) 0.00 - 0.15 mg/mg[Cre]  Comprehensive metabolic panel     Status: Abnormal   Collection Time: 02/09/15  1:20 AM  Result Value Ref Range   Sodium 137 135 - 145 mmol/L   Potassium 3.1 (L) 3.5 - 5.1 mmol/L   Chloride 107 101 - 111 mmol/L   CO2 22 22 - 32 mmol/L   Glucose, Bld 91 65 - 99 mg/dL   BUN <5 (L) 6 - 20 mg/dL   Creatinine, Ser 1.32 0.44 - 1.00 mg/dL   Calcium 8.6 (L) 8.9 - 10.3 mg/dL   Total Protein 6.3 (L) 6.5 -  8.1 g/dL   Albumin 3.0 (L) 3.5 - 5.0 g/dL   AST 18 15 - 41 U/L   ALT 9 (L) 14 - 54 U/L   Alkaline Phosphatase 134 (H) 38 - 126 U/L   Total Bilirubin 0.3 0.3 - 1.2 mg/dL   GFR calc non Af Amer >60 >60 mL/min   GFR calc Af Amer >60 >60 mL/min   Anion gap 8 5 - 15  CBC     Status: Abnormal   Collection Time: 02/09/15  1:20 AM  Result Value Ref Range   WBC 14.3 (H) 4.0 - 10.5 K/uL   RBC 3.75 (L) 3.87 - 5.11 MIL/uL   Hemoglobin 10.3 (L) 12.0 - 15.0 g/dL   HCT 44.0 (L) 10.2 - 72.5 %   MCV 81.3 78.0 - 100.0 fL   MCH 27.5 26.0 - 34.0 pg   MCHC 33.8 30.0 - 36.0 g/dL   RDW 36.6 44.0 - 34.7 %   Platelets 273 150 - 400 K/uL  Type and screen     Status: None   Collection Time: 02/09/15  1:20 AM  Result Value Ref Range   ABO/RH(D) O POS    Antibody Screen NEG    Sample Expiration 02/12/2015   Group B strep by PCR     Status: None   Collection Time: 02/09/15  2:30 AM  Result Value Ref Range   Group B strep by PCR NEGATIVE NEGATIVE   Assessment / Plan: Preeclampsia stable > Tylenol for mild H/A on MgSO4 Labor: early labor> continue pitocin Fetal Wellbeing: category 1 Pain Control:  N/a, plans epidural Expected mode of delivery: NSVD  Damere Brandenburg 02/09/2015, 11:29 AM

## 2015-02-09 NOTE — Progress Notes (Signed)
Patient ID: Chelsea Ferguson, female   DOB: 09/28/1995, 19 y.o.   MRN: [id number] Chelsea Ferguson is a 19 y.o. G1P0 at 3936w4d admitted for IOL 2/2 preE  Subjective: Comfortable. Denies H/A  Objective: BP 130/77 mmHg  Pulse 78  Temp(Src) 97.7 F (36.5 C) (Oral)  Resp 20  Ht 5\' 1"  (1.549 m)  Wt 84.823 kg (187 lb)  BMI 35.35 kg/m2  SpO2 99%  LMP 06/07/2014 (Approximate) Filed Vitals:   02/09/15 1400 02/09/15 1430 02/09/15 1500 02/09/15 1530  BP: 150/91 162/90 133/85 130/77  Pulse: 95 94 81 78  Temp:   97.7 F (36.5 C)   TempSrc:   Oral   Resp: 20     Height:      Weight:      SpO2:       Fetal Heart FHR: 120 bpm, variability: moderate,  accelerations:  Present,  decelerations:  Absent   Contractions: irregular q2-6 on Mag 2 gm/h and pitocin  SVE:   Dilation: 4 Effacement (%): 80 Station: Ballotable Exam by:: Dr. Penne LashLeggett Exam at 1421 (no change)  Assessment / Plan:  Labor: Not in established labor s/p cytotec/FB> continue increasing pitocin Fetal Wellbeing: Cat 1 Pain Control:  n/a Expected mode of delivery: NSVD  Chelsea Ferguson 02/09/2015, 3:58 PM

## 2015-02-09 NOTE — Progress Notes (Signed)
Patient ID: Chelsea HelperRebekah J Ferguson, female   DOB: 10/25/1995, 19 y.o.   MRN: [id number] Chelsea Ferguson is a 19 y.o. G1P0 at 2944w4d admitted for IOL due to preE with severe-range BPs  Subjective: Comfortable, getting frustrated with lack of progress. Started on Labetalol 100mg  bid. No H/A, visual sx.   Objective: BP 139/88 mmHg  Pulse 86  Temp(Src) 97.6 F (36.4 C) (Oral)  Resp 18  Ht 5\' 1"  (1.549 m)  Wt 84.823 kg (187 lb)  BMI 35.35 kg/m2  SpO2 99%  LMP 06/07/2014 (Approximate) Filed Vitals:   02/09/15 1800 02/09/15 1830 02/09/15 1900 02/09/15 1930  BP: 137/79 147/91 137/79 139/88  Pulse: 86 87 85 86  Temp:   97.6 F (36.4 C)   TempSrc:   Oral   Resp:   18 18  Height:      Weight:      SpO2:       Fetal Heart FHR: 125-130 bpm, variability: moderate,  accelerations:  Present,  decelerations:  Absent   Contractions: irregular q 3-5 Pit at 32mu Mag at 2gm/h  SVE:   Dilation: 4 Effacement (%): 80 Station: -3 Exam by:: katherine g jones Rn   Assessment / Plan: Pre-E stable Labor: not in established labor>  pitocin break. Will shower, eat light  Fetal Wellbeing: Cat 1 Pain Control:  n/a Expected mode of delivery: NSVD  Chelsea Ferguson 02/09/2015, 7:47 PM

## 2015-02-09 NOTE — Progress Notes (Signed)
Patient ID: Tera HelperRebekah J Ferguson, female   DOB: 01/18/1996, 19 y.o.   MRN: [id number] Doing well Currently on a break from Pitocin  Did have something to eat and shower  Filed Vitals:   02/09/15 1900 02/09/15 1930 02/09/15 2039 02/09/15 2100  BP: 137/79 139/88 156/89 147/91  Pulse: 85 86 88 87  Temp: 97.6 F (36.4 C)     TempSrc: Oral     Resp: 18 18 18 18   Height:      Weight:      SpO2:       DTRs 2+ Clonus 1-2 beats  FHR reactive with accels to 150s No contractions  Cervix deferred  Pt has not spoken with Neonatology yet and wants to  Neonatology re-consulted  Restart Pitocin at 2200hrs

## 2015-02-09 NOTE — H&P (Signed)
Patient is 19 y.o. G1P0 [redacted]w[redacted]d by LMPhere with complaints of feeling as though there is a balloon in her vagina all day. She reports intermittent back pain and lower abdominal cramping that she thinks may be contractions all day as well.   +FM, denies LOF, VB, vaginal discharge.   She was admitted for evaluation of preeclampsia the week of 6/20 and discharged home on 6/22. She had normal labs except for an elevation of AST to 57. She received Betamethasone.   Gets care at [facility]. BP was 134/75 in clinic on 7/1. Husband reports that he has been checking her BP at home for the last week or so. Yesterday AM was 17-s/100 and this AM was 180s/100s. Reprots chronic headahce with spots in her vision, as well as waxing and waning LE edema. She had a 30 min episode of R sided chest/upper abdominal pain earlier this evening that resolved with Zantac.   Clinic [place] Prenatal Labs  Dating  LMP c/w 6 week ultrasound Blood type: --/--/O POS (12/09 2142)  Genetic Screen 1 Screen: NT normal         AFP:  Not done           Antibody: Neg  Anatomic [place]  Normal  Rubella:  immune  GTT Third trimester: 134 RPR:   NR  TDaP vaccine 12/23/14 HBsAg:   Neg  Flu vaccine 07/14/14 HIV: NONREACTIVE (12/09 2142)   GBS  GBS: negative (01/26/15)  Contraception Undecided Pap:not indicated  WUJW Food breast   Circumcision N/A   Pediatrician [facility]   Support Person Husband - Fayrene Fearing      Maternal Medical History:  Reason for admission: Nausea.  Fetal activity: Perceived fetal activity is normal.   Last perceived fetal movement was within the past hour.    Prenatal complications: Pre-eclampsia.   No bleeding.   Prenatal Complications - Diabetes: none.    OB History    Gravida Para Term Preterm AB TAB SAB Ectopic Multiple Living   1         0     Past Medical History  Diagnosis Date  . IBS (irritable bowel syndrome)   . Infertility, female   . PCOS (polycystic ovarian syndrome)   . Headache     Past Surgical History  Procedure Laterality Date  . No past surgeries     Family History: family history includes Diabetes in her maternal grandmother; Hypertension in her maternal grandmother and paternal grandmother. Social History:  reports that she has never smoked. She has never used smokeless tobacco. She reports that she does not drink alcohol or use illicit drugs.   Prenatal Transfer Tool  Maternal Diabetes: No Genetic Screening: Normal Maternal Ultrasounds/Referrals: Normal Fetal Ultrasounds or other Referrals:  None Maternal Substance Abuse:  No Significant Maternal Medications:  None Significant Maternal Lab Results:  None Other Comments:  None  Review of Systems  Constitutional: Negative for fever, chills and weight loss.  HENT: Negative for congestion, hearing loss and sore throat.   Eyes: Negative for blurred vision, double vision, photophobia and pain.  Respiratory: Negative for cough, shortness of breath and wheezing.   Cardiovascular: Positive for leg swelling. Negative for chest pain.  Gastrointestinal: Positive for heartburn. Negative for nausea, vomiting and abdominal pain.  Genitourinary: Negative for dysuria, urgency, frequency and hematuria.  Musculoskeletal: Positive for back pain. Negative for myalgias.  Skin: Negative for itching and rash.  Neurological: Positive for headaches. Negative for dizziness, tingling, speech change, focal weakness and  loss of consciousness.  Endo/Heme/Allergies: Negative.   Psychiatric/Behavioral: Negative.     Dilation: 2.5 Effacement (%): 80 Station: Ballotable Exam by:: Dr B   Ceasar MonsFiled Vitals:   02/09/15 0030 02/09/15 0045 02/09/15 0100 02/09/15 0115  BP: 153/89 169/93 161/94 170/102  Pulse: 74 77 74 99  Temp:      TempSrc:      Resp:      Height:      Weight:      SpO2:        Maternal Exam:  Uterine Assessment: Contraction strength is mild.  Contraction frequency is irregular.   Abdomen: Patient reports no  abdominal tenderness. Fetal presentation: vertex  Introitus: Normal vulva. Normal vagina.  Ferning test: not done.  Nitrazine test: not done. Amniotic fluid character: not assessed.  Pelvis: adequate for delivery.   Cervix: Cervix evaluated by sterile speculum exam and digital exam.     Fetal Exam Fetal Monitor Review: Baseline rate: 130.  Variability: moderate (6-25 bpm).   Pattern: accelerations present and no decelerations.    Fetal State Assessment: Category I - tracings are normal.     Physical Exam  Constitutional: She is oriented to person, place, and time. She appears well-developed and well-nourished. No distress.  HENT:  Head: Normocephalic and atraumatic.  Eyes: EOM are normal. Pupils are equal, round, and reactive to light.  Neck: Normal range of motion. Neck supple.  Cardiovascular: Normal rate, regular rhythm, normal heart sounds and intact distal pulses.   No murmur heard. Respiratory: Effort normal and breath sounds normal. No respiratory distress. She has no wheezes.  GI: There is no tenderness. There is no rebound and no guarding.  gravid  Genitourinary:  GYN:  External genitalia within normal limits.  Vaginal mucosa pink, moist, normal rugae.  Nonfriable cervix without lesions, no pooling or bleeding noted on speculum exam.    Musculoskeletal: Normal range of motion. She exhibits edema. She exhibits no tenderness.  Neurological: She is alert and oriented to person, place, and time. She displays normal reflexes. No cranial nerve deficit. She exhibits normal muscle tone.  Skin: Skin is warm and dry. No rash noted.  Psychiatric: She has a normal mood and affect. Her behavior is normal. Thought content normal.    Prenatal labs: ABO, Rh: --/--/O POS (06/20 1843) Antibody: NEG (06/20 1843) Rubella: 1.02 (12/21 1615) RPR: NON REAC (05/17 1420)  HBsAg: NEGATIVE (12/21 1615)  HIV: NONREACTIVE (05/17 1420)  GBS:     Assessment/Plan: Chelsea Ferguson is a 19  y.o. G1P0 at 7828w4d here for IOL 2/2 severe pre-eclampsia.  #Labor: IOL with FB placement and cytotec #Pain: Epidural upon request when in active labor #FWB: Cat 1, reassuring #ID:  GBS neg #MOF: breast #MOC: unsure #Circ:  N/a - girl  #severe preE:  - monitor BP - IV Labetolol/Hydralazine prn - CBC, CMET pending - start magnesium    Erasmo DownerAngela M Bacigalupo, MD, MPH PGY-2,  [facility] 02/09/2015 1:37 AM  OB fellow attestation:  I have seen and examined this patient; I agree with above documentation in the resident's note.   Chelsea Ferguson is a 19 y.o. G1P0 here initially for evaluation of contractions/pressure found to have headache, severe range blood pressure and urine protein to creatinine ratio  PE: BP 159/101 mmHg  Pulse 85  Temp(Src) 98.6 F (37 C) (Oral)  Resp 20  Ht 5\' 1"  (1.549 m)  Wt 187 lb (84.823 kg)  BMI 35.35 kg/m2  SpO2 99%  LMP 06/07/2014 (Approximate) Gen: calm comfortable, NAD Resp: normal effort, no distress Abd: gravid  ROS, labs, PMH reviewed  Plan: MOF: breast MOC: undecided ID: GBS unknown, had culture drawn recently but still pending.  GBS pending FWB: cat I, s/p BMZ 6/20-21 Labor: FB and PO cytotec Pain: nubain IV/IM/phenergan IM preE with severe fx: magnesium, prn antihypertensives, proceed with IOL.  Discussed implications of preterm induction, will request NICU consult as patient still has several questions and tearful  Chelsea Ferguson ROCIO 02/09/2015, 2:20 AM

## 2015-02-09 NOTE — Consult Note (Signed)
Neonatology Consult to Antenatal Patient: 02/09/2015 11:29 PM    I was requested by Dr Penne LashLeggett to see this patient in order to provide antenatal counseling due to severe preeclampsia at 35 4/[redacted] weeks gestation.    Chelsea Ferguson is a 19y/o Primigravida who was admitted today and is now 4035 4/[redacted] weeks GA.   She is currently being induced for severe preeclampsia on MgSO4 and Labetalol.  She received a course of BMZ last 6/21 and 6/22.   I spoke with Chelsea Ferguson, Chelsea Ferguson and Chelsea Ferguson as well as Chelsea Ferguson in Room 166.   We discussed in detail what to expect in case of delivery of the infant in the next few days.  Informed them that since infant is almost [redacted] weeks gestation it is unlikely that she will need to be admitted to the NICU based on her age and estimated weight (per parents almost 5 lbs from 2 weeks ago).  Discussed possible respiratory complications since Chelsea Ferguson is on MgSO4 which can cause some respiratory depression.  Other possible complications that can occur in a late preterm infant are temperature and blood glucose instability.  MOB desires breast feeding, which was encouraged.  They were attentive, initially anxious but seem to be quite relieved after, had appropriate questions, and expressed appreciation for my input.     Thank you for asking us to see this patient and allowing us to participate in her care.  Please call if there are any further questions.   Overton MamMary Ann T Dimaguila, MD (Attending Neonatologist)  Total length of face-to-face or floor/unit time for this encounter was 20 minutes. Counseling and/or coordination of care was greater than fifty percent of the time.

## 2015-02-09 NOTE — MAU Note (Signed)
Dr B at bedside talking with patient after patient stating she does not want an IV. Risks discussed with patient by RN. Patient still refusing.

## 2015-02-10 ENCOUNTER — Other Ambulatory Visit: Payer: Medicaid Other

## 2015-02-10 ENCOUNTER — Inpatient Hospital Stay (HOSPITAL_COMMUNITY): Payer: Medicaid Other | Admitting: Anesthesiology

## 2015-02-10 ENCOUNTER — Encounter: Payer: Medicaid Other | Admitting: Family Medicine

## 2015-02-10 ENCOUNTER — Encounter (HOSPITAL_COMMUNITY): Payer: Self-pay | Admitting: *Deleted

## 2015-02-10 DIAGNOSIS — O1490 Unspecified pre-eclampsia, unspecified trimester: Secondary | ICD-10-CM

## 2015-02-10 DIAGNOSIS — Z3A35 35 weeks gestation of pregnancy: Secondary | ICD-10-CM

## 2015-02-10 DIAGNOSIS — O1413 Severe pre-eclampsia, third trimester: Secondary | ICD-10-CM

## 2015-02-10 LAB — COMPREHENSIVE METABOLIC PANEL
ALBUMIN: 2.9 g/dL — AB (ref 3.5–5.0)
ALK PHOS: 139 U/L — AB (ref 38–126)
ALT: 8 U/L — ABNORMAL LOW (ref 14–54)
AST: 19 U/L (ref 15–41)
Anion gap: 7 (ref 5–15)
BILIRUBIN TOTAL: 0.3 mg/dL (ref 0.3–1.2)
BUN: <5 mg/dL — ABNORMAL LOW (ref 6–20)
CHLORIDE: 103 mmol/L (ref 101–111)
CO2: 23 mmol/L (ref 22–32)
Calcium: 7.1 mg/dL — ABNORMAL LOW (ref 8.9–10.3)
Creatinine, Ser: 0.78 mg/dL (ref 0.44–1.00)
GFR calc Af Amer: >60 mL/min (ref >60)
GFR calc non Af Amer: >60 mL/min (ref >60)
Glucose, Bld: 107 mg/dL — ABNORMAL HIGH (ref 65–99)
POTASSIUM: 2.8 mmol/L — AB (ref 3.5–5.1)
SODIUM: 133 mmol/L — AB (ref 135–145)
TOTAL PROTEIN: 6.2 g/dL — AB (ref 6.5–8.1)

## 2015-02-10 LAB — CBC
HCT: 29.7 % — ABNORMAL LOW (ref 36.0–46.0)
HEMATOCRIT: 26.5 % — AB (ref 36.0–46.0)
Hemoglobin: 9.8 g/dL — ABNORMAL LOW (ref 12.0–15.0)
Hemoglobin: 8.7 g/dL — ABNORMAL LOW (ref 12.0–15.0)
MCH: 26.6 pg (ref 26.0–34.0)
MCH: 26.7 pg (ref 26.0–34.0)
MCHC: 33 g/dL (ref 30.0–36.0)
MCHC: 32.8 g/dL (ref 30.0–36.0)
MCV: 80.5 fL (ref 78.0–100.0)
MCV: 81.3 fL (ref 78.0–100.0)
PLATELETS: 250 10*3/uL (ref 150–400)
Platelets: 267 10*3/uL (ref 150–400)
RBC: 3.69 MIL/uL — ABNORMAL LOW (ref 3.87–5.11)
RBC: 3.26 MIL/uL — ABNORMAL LOW (ref 3.87–5.11)
RDW: 15.6 % — ABNORMAL HIGH (ref 11.5–15.5)
RDW: 15.8 % — AB (ref 11.5–15.5)
WBC: 13.8 10*3/uL — ABNORMAL HIGH (ref 4.0–10.5)
WBC: 15.4 10*3/uL — ABNORMAL HIGH (ref 4.0–10.5)

## 2015-02-10 LAB — PREPARE RBC (CROSSMATCH)

## 2015-02-10 LAB — MAGNESIUM: MAGNESIUM: 6 mg/dL — AB (ref 1.7–2.4)

## 2015-02-10 MED ORDER — OXYCODONE-ACETAMINOPHEN 5-325 MG PO TABS
1.0000 | ORAL_TABLET | ORAL | Status: DC | PRN
Start: 2015-02-10 — End: 2015-02-13
  Filled 2015-02-10: qty 1

## 2015-02-10 MED ORDER — LANOLIN HYDROUS EX OINT: TOPICAL_OINTMENT | CUTANEOUS | Status: DC | PRN

## 2015-02-10 MED ORDER — DIPHENHYDRAMINE HCL 50 MG/ML IJ SOLN: 12.5000 mg | INTRAMUSCULAR | Status: DC | PRN

## 2015-02-10 MED ORDER — MISOPROSTOL 200 MCG PO TABS
1000.0000 ug | ORAL_TABLET | Freq: Once | ORAL | Status: AC
  Administered 2015-02-10: 1000 ug via RECTAL

## 2015-02-10 MED ORDER — PRENATAL MULTIVITAMIN CH
1.0000 | ORAL_TABLET | Freq: Every day | ORAL | Status: DC
  Filled 2015-02-10 (×2): qty 1

## 2015-02-10 MED ORDER — CARBOPROST TROMETHAMINE 250 MCG/ML IM SOLN: 250.0000 ug | Freq: Once | INTRAMUSCULAR | Status: DC

## 2015-02-10 MED ORDER — LACTATED RINGERS IV BOLUS (SEPSIS)
500.0000 mL | Freq: Once | INTRAVENOUS | Status: AC
  Administered 2015-02-10: 500 mL via INTRAVENOUS

## 2015-02-10 MED ORDER — PHENYLEPHRINE 40 MCG/ML (10ML) SYRINGE FOR IV PUSH (FOR BLOOD PRESSURE SUPPORT)
80.0000 ug | PREFILLED_SYRINGE | INTRAVENOUS | Status: DC | PRN
  Filled 2015-02-10: qty 2
  Filled 2015-02-10: qty 20

## 2015-02-10 MED ORDER — ZOLPIDEM TARTRATE 5 MG PO TABS
5.0000 mg | ORAL_TABLET | Freq: Every evening | ORAL | Status: DC | PRN
  Administered 2015-02-12: 5 mg via ORAL
  Filled 2015-02-10: qty 1

## 2015-02-10 MED ORDER — EPHEDRINE 5 MG/ML INJ
10.0000 mg | INTRAVENOUS | Status: DC | PRN
  Filled 2015-02-10: qty 2

## 2015-02-10 MED ORDER — TETANUS-DIPHTH-ACELL PERTUSSIS 5-2.5-18.5 LF-MCG/0.5 IM SUSP
0.5000 mL | Freq: Once | INTRAMUSCULAR | Status: DC
Start: 2015-02-11 — End: 2015-02-13
  Filled 2015-02-10 (×2): qty 0.5

## 2015-02-10 MED ORDER — FENTANYL 2.5 MCG/ML BUPIVACAINE 1/10 % EPIDURAL INFUSION (WH - ANES)
12.0000 mL/h | INTRAMUSCULAR | Status: DC | PRN
  Administered 2015-02-10 (×3): 12 mL/h via EPIDURAL
  Filled 2015-02-10: qty 125

## 2015-02-10 MED ORDER — SENNOSIDES-DOCUSATE SODIUM 8.6-50 MG PO TABS
2.0000 | ORAL_TABLET | ORAL | Status: DC
Start: 2015-02-11 — End: 2015-02-13
  Administered 2015-02-13: 2 via ORAL
  Filled 2015-02-10 (×2): qty 2

## 2015-02-10 MED ORDER — CARBOPROST TROMETHAMINE 250 MCG/ML IM SOLN
250.0000 ug | INTRAMUSCULAR | Status: DC | PRN
  Administered 2015-02-10 (×2): 250 ug via INTRAMUSCULAR

## 2015-02-10 MED ORDER — LIDOCAINE HCL (PF) 1 % IJ SOLN
INTRAMUSCULAR | Status: DC | PRN
  Administered 2015-02-10: 4 mL
  Administered 2015-02-10: 4 mL via EPIDURAL
  Administered 2015-02-10: 2 mL

## 2015-02-10 MED ORDER — FERROUS SULFATE 325 (65 FE) MG PO TABS
325.0000 mg | ORAL_TABLET | Freq: Two times a day (BID) | ORAL | Status: DC
  Administered 2015-02-11 – 2015-02-13 (×5): 325 mg via ORAL
  Filled 2015-02-10 (×5): qty 1

## 2015-02-10 MED ORDER — MISOPROSTOL 200 MCG PO TABS
ORAL_TABLET | ORAL | Status: AC
  Administered 2015-02-10: 1000 ug via RECTAL
  Filled 2015-02-10: qty 5

## 2015-02-10 MED ORDER — WITCH HAZEL-GLYCERIN EX PADS: 1.0000 "application " | MEDICATED_PAD | CUTANEOUS | Status: DC | PRN

## 2015-02-10 MED ORDER — ACETAMINOPHEN 325 MG PO TABS: 650.0000 mg | ORAL_TABLET | ORAL | Status: DC | PRN

## 2015-02-10 MED ORDER — SODIUM CHLORIDE 0.9 % IV SOLN: Freq: Once | INTRAVENOUS | Status: DC

## 2015-02-10 MED ORDER — OXYCODONE-ACETAMINOPHEN 5-325 MG PO TABS: 2.0000 | ORAL_TABLET | ORAL | Status: DC | PRN

## 2015-02-10 MED ORDER — CARBOPROST TROMETHAMINE 250 MCG/ML IM SOLN
250.0000 ug | INTRAMUSCULAR | Status: AC
  Administered 2015-02-10 – 2015-02-11 (×3): 250 ug via INTRAMUSCULAR
  Filled 2015-02-10 (×3): qty 1

## 2015-02-10 MED ORDER — LOPERAMIDE HCL 2 MG PO CAPS
2.0000 mg | ORAL_CAPSULE | ORAL | Status: DC | PRN
  Administered 2015-02-10: 2 mg via ORAL
  Administered 2015-02-10: 4 mg via ORAL
  Administered 2015-02-10: 2 mg via ORAL
  Filled 2015-02-10 (×5): qty 2

## 2015-02-10 MED ORDER — ONDANSETRON HCL 4 MG PO TABS: 4.0000 mg | ORAL_TABLET | ORAL | Status: DC | PRN

## 2015-02-10 MED ORDER — DIPHENHYDRAMINE HCL 25 MG PO CAPS: 25.0000 mg | ORAL_CAPSULE | Freq: Four times a day (QID) | ORAL | Status: DC | PRN

## 2015-02-10 MED ORDER — LACTATED RINGERS IV BOLUS (SEPSIS)
1000.0000 mL | Freq: Once | INTRAVENOUS | Status: AC
  Administered 2015-02-10: 1000 mL via INTRAVENOUS

## 2015-02-10 MED ORDER — CARBOPROST TROMETHAMINE 250 MCG/ML IM SOLN
INTRAMUSCULAR | Status: AC
  Filled 2015-02-10: qty 1

## 2015-02-10 MED ORDER — TERBUTALINE SULFATE 1 MG/ML IJ SOLN
INTRAMUSCULAR | Status: AC
  Filled 2015-02-10: qty 1

## 2015-02-10 MED ORDER — SIMETHICONE 80 MG PO CHEW: 80.0000 mg | CHEWABLE_TABLET | ORAL | Status: DC | PRN

## 2015-02-10 MED ORDER — FENTANYL 2.5 MCG/ML BUPIVACAINE 1/10 % EPIDURAL INFUSION (WH - ANES)
14.0000 mL/h | INTRAMUSCULAR | Status: DC | PRN
  Filled 2015-02-10: qty 125

## 2015-02-10 MED ORDER — LACTATED RINGERS IV SOLN: INTRAVENOUS | Status: DC

## 2015-02-10 MED ORDER — IBUPROFEN 600 MG PO TABS
600.0000 mg | ORAL_TABLET | Freq: Four times a day (QID) | ORAL | Status: DC
Start: 2015-02-11 — End: 2015-02-13
  Administered 2015-02-10 – 2015-02-13 (×11): 600 mg via ORAL
  Filled 2015-02-10 (×11): qty 1

## 2015-02-10 MED ORDER — CARBOPROST TROMETHAMINE 250 MCG/ML IM SOLN
INTRAMUSCULAR | Status: AC
  Administered 2015-02-10: 250 ug via INTRAMUSCULAR
  Filled 2015-02-10: qty 1

## 2015-02-10 MED ORDER — BENZOCAINE-MENTHOL 20-0.5 % EX AERO
1.0000 "application " | INHALATION_SPRAY | CUTANEOUS | Status: DC | PRN
  Administered 2015-02-10: 1 via TOPICAL
  Filled 2015-02-10: qty 56

## 2015-02-10 MED ORDER — ONDANSETRON HCL 4 MG/2ML IJ SOLN: 4.0000 mg | INTRAMUSCULAR | Status: DC | PRN

## 2015-02-10 MED ORDER — PHENYLEPHRINE 40 MCG/ML (10ML) SYRINGE FOR IV PUSH (FOR BLOOD PRESSURE SUPPORT)
80.0000 ug | PREFILLED_SYRINGE | INTRAVENOUS | Status: DC | PRN
  Filled 2015-02-10: qty 2

## 2015-02-10 MED ORDER — DIBUCAINE 1 % RE OINT: 1.0000 "application " | TOPICAL_OINTMENT | RECTAL | Status: DC | PRN

## 2015-02-10 MED ORDER — LOPERAMIDE HCL 2 MG PO CAPS
2.0000 mg | ORAL_CAPSULE | ORAL | Status: DC | PRN
  Administered 2015-02-11 (×4): 2 mg via ORAL
  Filled 2015-02-10 (×5): qty 1

## 2015-02-10 NOTE — Anesthesia Preprocedure Evaluation (Addendum)
Anesthesia Evaluation  Patient identified by MRN, date of birth, ID band Patient awake    Reviewed: Allergy & Precautions, H&P , Patient's Chart, lab work & pertinent test results, reviewed documented beta blocker date and time   Airway Mallampati: II  TM Distance: >3 FB Neck ROM: full    Dental no notable dental hx.    Pulmonary neg pulmonary ROS,  breath sounds clear to auscultation  Pulmonary exam normal       Cardiovascular hypertension, Normal cardiovascular examRhythm:regular Rate:Normal     Neuro/Psych  Headaches, negative psych ROS   GI/Hepatic negative GI ROS, Neg liver ROS,   Endo/Other  negative endocrine ROS  Renal/GU negative Renal ROS  negative genitourinary   Musculoskeletal   Abdominal   Peds  Hematology negative hematology ROS (+)   Anesthesia Other Findings   Reproductive/Obstetrics (+) Pregnancy                             Anesthesia Physical Anesthesia Plan  ASA: III  Anesthesia Plan: Epidural   Post-op Pain Management:    Induction:   Airway Management Planned:   Additional Equipment:   Intra-op Plan:   Post-operative Plan:   Informed Consent: I have reviewed the patients History and Physical, chart, labs and discussed the procedure including the risks, benefits and alternatives for the proposed anesthesia with the patient or authorized representative who has indicated his/her understanding and acceptance.     Plan Discussed with:   Anesthesia Plan Comments:        Anesthesia Quick Evaluation

## 2015-02-10 NOTE — Progress Notes (Signed)
Type and cross for 2 units to be held per dr Adrian Blackwaterstinson. Dr Adrian BlackwaterStinson requests CBC to be drawn. Team remains at bedside. Foley catheter in place.

## 2015-02-10 NOTE — Consult Note (Signed)
Neonatology Note:  Attendance at Code Apgar:   Our team responded to a Code Apgar call to room # 166 following prolonged second stage of labor and vacuum-assisted vaginal delivery, due to infant with apnea. The requesting physician was Dr. Adrian BlackwaterStinson. The mother is a G1P0 O pos, GBS neg with IOL at 35 5/7 weeks for pre-eclampsia. She has been on Magnesium sulfate for > 24 hours. ROM occurred 10  hours PTD and the fluid was clear.  At delivery, the baby was stunned, floppy, and apneic. The OB nursing staff in attendance gave vigorous stimulation and a Code Apgar was called. Our team arrived at 1.5 minutes of life, at which time the baby was breathing and pink, with a HR > 100. She had markedly decreased tone. A pulse oximeter was in place and her O2 saturations in room air were within normal limits for age. She had some subcostal retractions and rales could be heard, so we bulb suctioned and performed chest PT, after which she sounded much clearer and had very good air exchange. Her O2 saturations were > 90% after 5 minutes of life and no further apnea was seen. 1-min Apgar score to be assigned by OB nursing: 5 and 10-min Ap 8/9. I instructed the OB nurse caring for the baby to observe hr closely and to take her to CN if she failed to maintain normal O2 saturations while having skin to skin time. I spoke with the parents in the DR, then transferred the baby to the Pediatrician's care.   Chelsea Souhristie [name], MD

## 2015-02-10 NOTE — Progress Notes (Signed)
Patient ID: Chelsea Ferguson, femTera Helperale   DOB: 03/06/1996, 19 y.o.   MRN: [id number]  Filed Vitals:   02/10/15 0300 02/10/15 0330 02/10/15 0447 02/10/15 0500  BP: 130/78 135/85 136/82 148/92  Pulse: 83 87 85 91  Temp:      TempSrc:      Resp:  18    Height:      Weight:      SpO2:       FHR reactive UCs every 2-6 minutes  Dilation: 4 Effacement (%): 70, 80 Cervical Position: Middle Station: -2 Presentation: Vertex Exam by:: Rogelio SeenMcCall, RN   Continue to increase Pitocin

## 2015-02-10 NOTE — Anesthesia Procedure Notes (Signed)
Epidural Patient location during procedure: OB Start time: 02/10/2015 11:20 AM  Staffing Anesthesiologist: Sharde Gover Performed by: anesthesiologist   Preanesthetic Checklist Completed: patient identified, site marked, surgical consent, pre-op evaluation, timeout performed, IV checked, risks and benefits discussed and monitors and equipment checked  Epidural Patient position: sitting Prep: site prepped and draped and DuraPrep Patient monitoring: continuous pulse ox and blood pressure Approach: midline Location: L2-L3 Injection technique: LOR air  Needle:  Needle type: Tuohy  Needle gauge: 17 G Needle length: 9 cm and 9 Needle insertion depth: 5 cm cm Catheter type: closed end flexible Catheter size: 19 Gauge Catheter at skin depth: 9 cm Test dose: negative  Assessment Events: blood not aspirated, injection not painful, no injection resistance, negative IV test and no paresthesia  Additional Notes .mf

## 2015-02-10 NOTE — Progress Notes (Signed)
Tera HelperRebekah J Ferguson is a 19 y.o. G1P0 at 6493w5d with IOL for preeclampsia with severe features.  Subjective: Occasional pressure  Objective: BP 142/69 mmHg  Pulse 85  Temp(Src) 97.8 F (36.6 C) (Oral)  Resp 18  Ht 5\' 1"  (1.549 m)  Wt 187 lb (84.823 kg)  BMI 35.35 kg/m2  SpO2 99%  LMP 06/07/2014 (Approximate) I/O last 3 completed shifts: In: 5020.6 [P.O.:1650; I.V.:3370.6] Out: 1610 [[id number] [Urine:4685; Emesis/NG output:600] Total I/O In: 1512.6 [P.O.:510; I.V.:1002.6] Out: 1650 [Urine:1650]  FHT:  FHR: 120s bpm, variability: minimal ,  accelerations:  Present,  decelerations:  Absent UC:   regular, every 2-3 minutes SVE:   Dilation: 10 Effacement (%): 100 Station: 0, +1 Exam by:: Stinson, DO  Labs: Lab Results  Component Value Date   WBC 13.8* 02/10/2015   HGB 9.8* 02/10/2015   HCT 29.7* 02/10/2015   MCV 80.5 02/10/2015   PLT 267 02/10/2015    Assessment / Plan: Labor down  Labor: Progressing normally Preeclampsia:  on magnesium sulfate, no signs or symptoms of toxicity, intake and ouput balanced and labs stable Fetal Wellbeing:  Category II Pain Control:  Epidural I/D:  n/a Anticipated MOD:  NSVD  STINSON, JACOB JEHIEL 02/10/2015, 3:42 PM

## 2015-02-10 NOTE — Progress Notes (Signed)
Postpartum hemorrhage called, team notified and activated

## 2015-02-10 NOTE — Progress Notes (Signed)
Patient ID: Chelsea HelperRebekah J Ferguson, female   DOB: 02/02/1996, 19 y.o.   MRN: [id number] Chelsea Ferguson is a 19 y.o. G1P0 at 6197w4d admitted for IOL due to preE with severe-range BPs  Subjective: Comfortable, continues to feel myalgias while on Mg.  Objective: BP 142/86 mmHg  Pulse 92  Temp(Src) 97.5 F (36.4 C) (Oral)  Resp 18  Ht 5\' 1"  (1.549 m)  Wt 187 lb (84.823 kg)  BMI 35.35 kg/m2  SpO2 99%  LMP 06/07/2014 (Approximate) Filed Vitals:   02/10/15 0031 02/10/15 0100 02/10/15 0125 02/10/15 0130  BP: 147/85 131/83  142/86  Pulse: 102 88  92  Temp:      TempSrc:      Resp: 18 18 18    Height:      Weight:      SpO2:       Fetal Heart FHR: 135 bpm, variability: moderate,  accelerations:  Present,  decelerations:  Absent   Contractions: irregular q 3-5 Pit at 12mu Mag at 2gm/h  SVE:   Dilation: 4 Effacement (%): 70, 80 Station: -2 Exam by:: Rogelio SeenMcCall, RN   Assessment / Plan: Pre-E stable - no severe range pressures Labor: not in established labor - continue to increase pitocin  Fetal Wellbeing: Cat 1 Pain Control:  n/a Expected mode of delivery: NSVD  Erasmo DownerAngela M Bacigalupo, MD, MPH PGY-2,   Family Medicine 02/10/2015 2:01 AM

## 2015-02-10 NOTE — Progress Notes (Signed)
Patient ID: Chelsea Ferguson, female   DOB: 05/08/1996, [age] yTera Helper.o.   MRN: [id number] Tera HelperRebekah J Ferguson is a 19 y.o. G1P0 at 5979w5d.  Subjective: Slightly more uncomfortable w/ UC's  Objective:  98.1 F (36.7 C)  84  20   143/87 mmHg    FHT:  FHR: 125 bpm, variability: min-mod,  accelerations:  15x15,  decelerations:  none UC:   Q  3-4 minutes, mild-moderate  Dilation: 4 Effacement (%): 80 Cervical Position: Middle Station: -14-2 Presentation: Vertex Exam by:: Dorathy KinsmanVirginia Rupa Lagan, CNM AROM moderate amount of clear fluid  Labs: Results for orders placed or performed during the hospital encounter of 02/08/15 (from the past 24 hour(s))  Magnesium     Status: Abnormal   Collection Time: 02/10/15  7:17 AM  Result Value Ref Range   Magnesium 6.0 (H) 1.7 - 2.4 mg/dL    Assessment / Plan: 1879w5d week IUP Labor: Early Fetal Wellbeing:  Category I Pain Control:  Comfort measures Anticipated MOD:  SVD  [place] Kisa Fujii, CNM 02/10/2015 5:50 PM

## 2015-02-11 LAB — CBC
HCT: 20.5 % — ABNORMAL LOW (ref 36.0–46.0)
HCT: 21.8 % — ABNORMAL LOW (ref 36.0–46.0)
Hemoglobin: 6.9 g/dL — CL (ref 12.0–15.0)
Hemoglobin: 7.3 g/dL — ABNORMAL LOW (ref 12.0–15.0)
MCH: 27.3 pg (ref 26.0–34.0)
MCH: 27.3 pg (ref 26.0–34.0)
MCHC: 33.7 g/dL (ref 30.0–36.0)
MCHC: 33.5 g/dL (ref 30.0–36.0)
MCV: 81 fL (ref 78.0–100.0)
MCV: 81.6 fL (ref 78.0–100.0)
PLATELETS: 204 10*3/uL (ref 150–400)
PLATELETS: 204 10*3/uL (ref 150–400)
RBC: 2.53 MIL/uL — AB (ref 3.87–5.11)
RBC: 2.67 MIL/uL — ABNORMAL LOW (ref 3.87–5.11)
RDW: 15.8 % — AB (ref 11.5–15.5)
RDW: 15.8 % — ABNORMAL HIGH (ref 11.5–15.5)
WBC: 18.1 10*3/uL — AB (ref 4.0–10.5)
WBC: 19.2 10*3/uL — ABNORMAL HIGH (ref 4.0–10.5)

## 2015-02-11 LAB — CBC WITH DIFFERENTIAL/PLATELET
BASOS PCT: 0 % (ref 0–1)
Basophils Absolute: 0 10*3/uL (ref 0.0–0.1)
Eosinophils Absolute: 0.1 10*3/uL (ref 0.0–0.7)
Eosinophils Relative: 1 % (ref 0–5)
HCT: 24.6 % — ABNORMAL LOW (ref 36.0–46.0)
Hemoglobin: 8.4 g/dL — ABNORMAL LOW (ref 12.0–15.0)
Lymphocytes Relative: 17 % (ref 12–46)
Lymphs Abs: 2.7 10*3/uL (ref 0.7–4.0)
MCH: 27.6 pg (ref 26.0–34.0)
MCHC: 34.1 g/dL (ref 30.0–36.0)
MCV: 80.9 fL (ref 78.0–100.0)
MONOS PCT: 9 % (ref 3–12)
Monocytes Absolute: 1.5 10*3/uL — ABNORMAL HIGH (ref 0.1–1.0)
Neutro Abs: 11.5 10*3/uL — ABNORMAL HIGH (ref 1.7–7.7)
Neutrophils Relative %: 73 % (ref 43–77)
Platelets: 181 10*3/uL (ref 150–400)
RBC: 3.04 MIL/uL — AB (ref 3.87–5.11)
RDW: 14.9 % (ref 11.5–15.5)
WBC: 15.8 10*3/uL — ABNORMAL HIGH (ref 4.0–10.5)

## 2015-02-11 LAB — MRSA PCR SCREENING: MRSA by PCR: NEGATIVE

## 2015-02-11 LAB — POSTPARTUM HEMORRHAGE PROTOCOL (BB NOTIFICATION)

## 2015-02-11 LAB — GC/CHLAMYDIA PROBE AMP
CT PROBE, AMP APTIMA: NEGATIVE
GC Probe RNA: NEGATIVE

## 2015-02-11 LAB — CCBB MATERNAL DONOR DRAW

## 2015-02-11 LAB — CULTURE, BETA STREP (GROUP B ONLY)

## 2015-02-11 MED ORDER — LACTATED RINGERS IV SOLN
INTRAVENOUS | Status: DC
  Administered 2015-02-11: 04:00:00 via INTRAVENOUS

## 2015-02-11 MED ORDER — SODIUM CHLORIDE 0.9 % IV SOLN
Freq: Once | INTRAVENOUS | Status: AC
  Administered 2015-02-11: 07:00:00 via INTRAVENOUS

## 2015-02-11 NOTE — Progress Notes (Signed)
Dr. Despina HiddenEure approved to end magnesium, infusion at 1910

## 2015-02-11 NOTE — Anesthesia Postprocedure Evaluation (Signed)
Anesthesia Post Note  Patient: Chelsea Ferguson  Procedure(s) Performed: * No procedures listed *  Anesthesia type: Epidural  Patient location: Mother/Baby  Post pain: Pain level controlled  Post assessment: Post-op Vital signs reviewed  Last Vitals:  Filed Vitals:   02/11/15 0727  BP: 119/63  Pulse: 111  Temp: 36.7 C  Resp: 18    Post vital signs: Reviewed  Level of consciousness: awake  Complications: No apparent anesthesia complications

## 2015-02-11 NOTE — Progress Notes (Signed)
Results for Tera HelperBLEDSOLE, Mikella J (MRN [id number]) as of 02/11/2015 06:22  Ref. Range 02/11/2015 05:26  WBC Latest Ref Range: 4.0-10.5 K/uL 18.1 (H)  RBC Latest Ref Range: 3.87-5.11 MIL/uL 2.53 (L)  Hemoglobin Latest Ref Range: 12.0-15.0 g/dL 6.9 (LL)  HCT Latest Ref Range: 36.0-46.0 % 20.5 (L)  MCV Latest Ref Range: 78.0-100.0 fL 81.0  MCH Latest Ref Range: 26.0-34.0 pg 27.3  MCHC Latest Ref Range: 30.0-36.0 g/dL 40.933.7  RDW Latest Ref Range: 11.5-15.5 % 15.8 (H)  Platelets Latest Ref Range: 150-400 K/uL 204  [facility] Maternal Donor Draw                                               Dr. Adrian BlackwaterStinson on floor when lab results obtained - see orders for blood transfusion                     Unknown COLLECTED BY LABO.Marland Kitchen..Marland Kitchen

## 2015-02-11 NOTE — Progress Notes (Signed)
Post Partum Day 1 Subjective: no complaints, up ad lib, voiding and tolerating PO. No dizziness or lightheadedness.  Feels tired.  Per nursing, when ambulating HR is in 120s.  Objective: Blood pressure 126/78, pulse 96, temperature 98.5 F (36.9 C), temperature source Oral, resp. rate 18, height 5\' 1"  (1.549 m), weight 173 lb 12.8 oz (78.835 kg), last menstrual period 06/07/2014, SpO2 99 %, unknown if currently breastfeeding.  Physical Exam:  General: alert, cooperative and no distress Lochia: appropriate Uterine Fundus: firm DVT Evaluation: No evidence of DVT seen on physical exam. Negative Homan's sign. No cords or calf tenderness.   Recent Labs  02/10/15 1952 02/11/15  HGB 8.7* 7.3*  HCT 26.5* 21.8*    Assessment/Plan: 1.  S/p VAVD 2.  PPH 3.  Preeclampsia with severe features  Continue magnesium x 24 hours past delivery. CBC pending - may need blood transfusion.  Pt consented. Will stop hemabate  Continue to watch BP Lactation support.   LOS: 2 days   STINSON, JACOB JEHIEL 02/11/2015, 6:13 AM

## 2015-02-12 LAB — TYPE AND SCREEN
ABO/RH(D): O POS
Antibody Screen: NEGATIVE
UNIT DIVISION: 0
UNIT DIVISION: 0
Unit division: 0
Unit division: 0

## 2015-02-12 NOTE — Progress Notes (Signed)
Post Partum Day 2 Subjective: no complaints, up ad lib, voiding and tolerating PO  Objective: Blood pressure 128/65, pulse 69, temperature 98.3 F (36.8 C), temperature source Oral, resp. rate 18, height 5\' 1"  (1.549 m), weight 180 lb 6.4 oz (81.829 kg), last menstrual period 06/07/2014, SpO2 100 %, unknown if currently breastfeeding.  Physical Exam:  General: alert, cooperative and no distress Lochia: appropriate Uterine Fundus: firm Incision:  DVT Evaluation: No evidence of DVT seen on physical exam.  I was called emergently away, pt was getting in the shower and exam was by [facility] nurse and reported to me  Verbally pt was feeling much better  Recent Labs  02/11/15 0526 02/11/15 1653  HGB 6.9* 8.4*  HCT 20.5* 24.6*  s/p 2 units PRBC  Assessment/Plan: Plan for discharge tomorrow and Breastfeeding   LOS: 3 days   Tyaisha Cullom H

## 2015-02-12 NOTE — Lactation Note (Signed)
This note was copied from the chart of Chelsea Micheline MazeRebekah Rentz. Lactation Consultation Note Visit with mom in the NICU to assist with latch.  Mom concerned she obtained colostrum the first pumping but none since.  Reassured and told mom that I will assist her with pumping today.  Normal late preterm behavior discussed.  Explained to mom that exclusive breastfeeding will be more likely to happen when baby is closer to term.  Assisted with position baby in football hold.  Baby showing cues and opening.  Baby will latch and suck 2-3 times but unable to sustain latch.  20 mm nipple shield applied and baby latched well and nursed off and on for 15 minutes.  When baby came off nipple shield was full of colostrum.  Instructed mom to pump every 3 hours and attempt breast with most feeds.  Patient Name: Chelsea Ferguson WUJWJ'XToday's Date: 02/12/2015 Reason for consult: Initial assessment;Infant < 6lbs;Late preterm infant;NICU baby   Maternal Data    Feeding Feeding Type: Breast Fed Nipple Type: Slow - flow Length of feed: 15 min  LATCH Score/Interventions Latch: Grasps breast easily, tongue down, lips flanged, rhythmical sucking. (WITH 20 MM NIPPLE SHIELD)  Audible Swallowing: A few with stimulation Intervention(s): Skin to skin;Hand expression;Alternate breast massage  Type of Nipple: Everted at rest and after stimulation  Comfort (Breast/Nipple): Soft / non-tender     Hold (Positioning): Assistance needed to correctly position infant at breast and maintain latch.  LATCH Score: 8  Lactation Tools Discussed/Used Pump Review: Setup, frequency, and cleaning;Milk Storage Initiated by:: RN Date initiated:: 02/11/15   Consult Status Consult Status: Follow-up Date: 02/13/15 Follow-up type: In-patient    Huston FoleyMOULDEN, Brody Bonneau S 02/12/2015, 9:25 AM

## 2015-02-12 NOTE — Progress Notes (Signed)
CSW attempted to meet with parents to introduce myself, complete assessment due to NICU admission and offer support, but they were not in MOB's room at this time.  CSW will attempt again at a later time. 

## 2015-02-13 ENCOUNTER — Encounter: Payer: Medicaid Other | Admitting: Obstetrics & Gynecology

## 2015-02-13 MED ORDER — IBUPROFEN 600 MG PO TABS: 600.0000 mg | ORAL_TABLET | Freq: Four times a day (QID) | ORAL | Status: DC | PRN

## 2015-02-13 NOTE — Progress Notes (Signed)
Discharge instructions given to patient. Understanding voiced. All belongings taken to private auto by patient's husband. Patient /husband ambulated to NICU to visit baby, then will leave for discharge home.

## 2015-02-13 NOTE — Discharge Summary (Signed)
Obstetric Discharge Summary Reason for Admission: Severe Preeclampsia Prenatal Procedures: Preeclampsia Intrapartum Procedures: vacuum and PPH hemmorrhage Postpartum Procedures: none Complications-Operative and Postpartum: hemorrhage  Operative Delivery Note At 7:34 PM a viable female was delivered via Vaginal, Vacuum (Extractor). Presentation: vertex; Position: Right,, Occiput,, Anterior; Station: +3.  Verbal consent: obtained from patient. Risks and benefits discussed in detail. Risks include, but are not limited to the risks of anesthesia, bleeding, infection, damage to maternal tissues, fetal cephalhematoma. There is also the risk of inability to effect vaginal delivery of the head, or shoulder dystocia that cannot be resolved by established maneuvers, leading to the need for emergency cesarean section.  APGAR: 3, 8; weight 5 lb 3.6 oz (2370 g).  Placenta status: Intact, Spontaneous.  Cord: 3 vessels with the following complications: none.   VAVD done for maternal exhaustion. Kiwi Vacuum extractor placed at +3 station 3cm posterior from anterior fontenelle. At the onset of the next contraction, the Vacuum was increased to 500mm Hg. One pull was done along the axis of the pelvis and the head was delivered. The vacuum was discontinued, due to mild shoulder dystocia, the patient was laid back and McRoberts was done and the remainder of the infant was delivered. The baby was delivered to mother's abdomen. Primary resuscitation started with stimulation. With low Apgar, cord clamped and cut and handed off to warmer. Cord blood gas obtained (arterial). Due to extensive bleeding, cytotec 1000mcg PR given, along with Hemabate IM. Second line initiated and code hemorrhage called. Second dose of Hemabate given. Uterine tone improved and bleeding slowed. The patient continued to have some mild to moderate flow. Bakari balloon placement was trialed, but was unsuccessful despite multiple  attempts. Ultrasound was used in attempt for guidance and it was noticed that her bladder was distended. The foley catheter was adjusted and drained the bladder. Bleeding improved after that with continued uterine tone.   UA cord pH: 7.29  An hour of direct critical care time was preformed, including life saving maneuvers to prevent hemorrhagic shock.  Anesthesia: Epidural  Instruments: none Episiotomy: None Lacerations: 1st degree Suture Repair: 3.0 vicryl rapide Est. Blood Loss (mL): 1800 mL  Mom to postpartum. Baby to Couplet care / Skin to Skin.   Chelsea Ferguson, Chelsea Ferguson 02/10/2015, 8:41 PM                                                Hospital Course:  Active Problems:   Severe preeclampsia   Chelsea Ferguson is a 19 y.o. G1P0101 s/p VVD.  Patient was admitted 02/08/15.  She has postpartum course that was complicated by Preterm induction due to severe preeclampsia and pph. The pt feels ready to go home and  will be discharged with outpatient follow-up.   Today: No acute events overnight.  Pt denies problems with ambulating, voiding or po intake.  She denies nausea or vomiting.  Pain is well controlled.  She has had flatus. She has not had bowel movement.  Lochia Small.  Plan for birth control is undecided.  Method of Feeding: Breast  Physical Exam:  BP 132/80 mmHg  Pulse 73  Temp(Src) 97.9 F (36.6 C) (Oral)  Resp 18  Ht 5\' 1"  (1.549 m)  Wt 81.829 kg (180 lb 6.4 oz)  BMI 34.10 kg/m2  SpO2 95%  LMP 06/07/2014 (Approximate)  Breastfeeding? Unknown General: alert and  cooperative Lochia: appropriate Uterine Fundus: firm Incision:  DVT Evaluation: No evidence of DVT seen on physical exam. Negative Homan's sign. No cords or calf tenderness.  H/H: Lab Results  Component Value Date/Time   HGB 8.4* 02/11/2015 04:53 PM   HCT 24.6* 02/11/2015 04:53 PM    Discharge Diagnoses: Preterm Delivery for severe preeclampsia  Discharge  Information: Date: 02/13/2015 Activity: pelvic rest Diet: routine  Medications: Ibuprofen Breast feeding:  Yes Condition: stable Instructions:  Discharge to: home Baby is still admitted to NICU.      Medication List    ASK your doctor about these medications        acetaminophen 500 MG tablet  Commonly known as:  TYLENOL  Take 1,000 mg by mouth every 6 (six) hours as needed for mild pain, moderate pain or headache.     diphenhydramine-acetaminophen 25-500 MG Tabs  Commonly known as:  TYLENOL PM  Take 2 tablets by mouth at bedtime as needed (sleep).     Doxylamine-Pyridoxine 10-10 MG Tbec  Take 2 tablets by mouth at bedtime. Take 2 tablets at bedtime. If symptoms persist, add 1 tab in the AM starting on day 3. If symptoms persist, add 1 tab in the PM starting day 4.     ferrous sulfate 325 (65 FE) MG tablet  Take 1 tablet (325 mg total) by mouth 3 (three) times daily with meals.     prenatal multivitamin Tabs tablet  Take 1 tablet by mouth daily at 12 noon.     ranitidine 150 MG capsule  Commonly known as:  ZANTAC  Take 1 capsule (150 mg total) by mouth daily.          Clemmons,Lori Grissett, CNM 02/13/2015,2:31 PM

## 2015-02-13 NOTE — Clinical Social Work Maternal (Signed)
  CLINICAL SOCIAL WORK MATERNAL/CHILD NOTE  Patient Details  Name: Chelsea Ferguson MRN: [id number] Date of Birth: 10/31/1995  Date:  02/13/2015  Clinical Social Worker Initiating Note:  Chelsea Ferguson, Cade Date/ Time Initiated:  02/13/15/1004     Child's Name:  Chelsea Ferguson   Legal Guardian:   (Parents: Chelsea Ferguson and Chelsea Ferguson)   Need for Interpreter:  None   Date of Referral:        Reason for Referral:   (No referral-NICU admission)   Referral Source:      Address:  [address] [place], [place] [address]  Phone number:  [phone]   Household Members:  Spouse   Natural Supports (not living in the home):  Extended Family, Friends, Immediate Family (Parents report that they have a great support system who live locally.)   Professional Supports:     Employment:     Type of Work:     Education:      Pensions consultant:  [employer]   Other Resources:      Cultural/Religious Considerations Which May Impact Care: None stated  Strengths:  Ability to meet basic needs , Compliance with medical plan , Home prepared for child , Pediatrician chosen , Understanding of illness (Pediatric follow up will be at [facility])   Risk Factors/Current Problems:  None   Cognitive State:  Alert , Insightful , Linear Thinking , Goal Oriented    Mood/Affect:  Tearful , Relaxed , Calm , Interested    CSW Assessment: CSW met with parents in MOB's third floor room/305 to introduce myself, offer support and complete assessment due to baby's admission to NICU at 35.5 weeks.  Parents were quiet, but pleasant and welcoming.  MOB reports feeling much better and provided an update on baby that she is now here only for jaundice issues.  MOB became tearful when we talked about the possibility of her discharge prior to baby's.  She states she has thought about this.  She informed CSW that Chelsea Ferguson was conceived through IVF and that "we have waited three years for her.  What's  a couple more days?"  CSW encouraged parents to identify positive aspects of the situation, which they did as MOB and baby's overall health at this time.  CSW encouraged parents to take this time as an opportunity to rest and recuperate (per MOB she had severe preeclampsia and post partum hemorrhage), even though this is not what they had planned for their baby's birth and homecoming.  CSW validated parents emotions and encouraged them to allow themselves to be emotional.  CSW provided education on PPD signs and symptoms to watch for.  Parents were attentive and seemed interested in the information given.  MOB commits to talking with CSW and or her doctor if she has concerns about her emotions at any time. Parents report having a great support system and everything they need for baby at home.  They live approximately 45 minutes from the hospital in [facility].  They state no issues with transportation.  Couple appears very supportive of each other.  MOB states they grew up in church together and have been married over 3 years.   CSW explained ongoing support services offered by NICU CSW and gave contact information.  CSW has no social concerns at this time.   CSW Plan/Description:  Patient/Family Education , Psychosocial Support and Ongoing Assessment of Needs    Chelsea Ferguson, Chelsea Ferguson 02/13/2015, 10:08 AM

## 2015-02-13 NOTE — Lactation Note (Signed)
This note was copied from the chart of Chelsea Ferguson Demas. Lactation Consultation Note  Follow up visit made.  Mom is pumping and hand expressing every 3 hours and excited milk volume is increasing.  Instructed to bring pump pieces with her when coming to NICU so she can use symphony pump.  She has a medela pump and style to use at home.  Encouraged to call with concerns prn.  Patient Name: Chelsea Ferguson Gatton WFUXN'AToday's Date: 02/13/2015     Maternal Data    Feeding Feeding Type: Breast Fed Nipple Type: Slow - flow  LATCH Score/Interventions                      Lactation Tools Discussed/Used     Consult Status      Huston FoleyMOULDEN, Zamari Vea S 02/13/2015, 5:53 PM

## 2015-02-13 NOTE — Discharge Instructions (Signed)

## 2015-02-15 ENCOUNTER — Inpatient Hospital Stay (HOSPITAL_COMMUNITY)
Admission: AD | Admit: 2015-02-15 | Discharge: 2015-02-15 | Disposition: A | Discharge status: Home / Self Care | Payer: Medicaid Other | Source: Ambulatory Visit | Attending: Obstetrics & Gynecology | Admitting: Obstetrics & Gynecology

## 2015-02-15 DIAGNOSIS — E876 Hypokalemia: Secondary | ICD-10-CM

## 2015-02-15 DIAGNOSIS — O8612 Endometritis following delivery: Secondary | ICD-10-CM

## 2015-02-15 DIAGNOSIS — O864 Pyrexia of unknown origin following delivery: Secondary | ICD-10-CM | POA: Insufficient documentation

## 2015-02-15 LAB — COMPREHENSIVE METABOLIC PANEL
ALT: 9 U/L — AB (ref 14–54)
AST: 21 U/L (ref 15–41)
Albumin: 2.7 g/dL — ABNORMAL LOW (ref 3.5–5.0)
Alkaline Phosphatase: 102 U/L (ref 38–126)
Anion gap: 8 (ref 5–15)
BUN: 7 mg/dL (ref 6–20)
CALCIUM: 8.3 mg/dL — AB (ref 8.9–10.3)
CO2: 25 mmol/L (ref 22–32)
Chloride: 103 mmol/L (ref 101–111)
Creatinine, Ser: 0.82 mg/dL (ref 0.44–1.00)
GFR calc Af Amer: >60 mL/min (ref >60)
GFR calc non Af Amer: >60 mL/min (ref >60)
GLUCOSE: 105 mg/dL — AB (ref 65–99)
POTASSIUM: 2.8 mmol/L — AB (ref 3.5–5.1)
Sodium: 136 mmol/L (ref 135–145)
Total Bilirubin: 0.6 mg/dL (ref 0.3–1.2)
Total Protein: 6 g/dL — ABNORMAL LOW (ref 6.5–8.1)

## 2015-02-15 LAB — CBC WITH DIFFERENTIAL/PLATELET
BASOS ABS: 0 10*3/uL (ref 0.0–0.1)
BASOS PCT: 0 % (ref 0–1)
Eosinophils Absolute: 0 10*3/uL (ref 0.0–0.7)
Eosinophils Relative: 0 % (ref 0–5)
HCT: 26.6 % — ABNORMAL LOW (ref 36.0–46.0)
HEMOGLOBIN: 8.7 g/dL — AB (ref 12.0–15.0)
Lymphocytes Relative: 8 % — ABNORMAL LOW (ref 12–46)
Lymphs Abs: 0.9 10*3/uL (ref 0.7–4.0)
MCH: 27.2 pg (ref 26.0–34.0)
MCHC: 32.7 g/dL (ref 30.0–36.0)
MCV: 83.1 fL (ref 78.0–100.0)
Monocytes Absolute: 0.6 10*3/uL (ref 0.1–1.0)
Monocytes Relative: 5 % (ref 3–12)
NEUTROS ABS: 10 10*3/uL — AB (ref 1.7–7.7)
NEUTROS PCT: 87 % — AB (ref 43–77)
Platelets: 186 10*3/uL (ref 150–400)
RBC: 3.2 MIL/uL — ABNORMAL LOW (ref 3.87–5.11)
RDW: 14.8 % (ref 11.5–15.5)
WBC: 11.5 10*3/uL — AB (ref 4.0–10.5)

## 2015-02-15 LAB — AMYLASE: Amylase: 26 U/L — ABNORMAL LOW (ref 28–100)

## 2015-02-15 LAB — LIPASE, BLOOD: Lipase: 24 U/L (ref 22–51)

## 2015-02-15 MED ORDER — IBUPROFEN 600 MG PO TABS
600.0000 mg | ORAL_TABLET | Freq: Once | ORAL | Status: AC
  Administered 2015-02-15: 600 mg via ORAL
  Filled 2015-02-15: qty 1

## 2015-02-15 MED ORDER — POTASSIUM CHLORIDE CRYS ER 10 MEQ PO TBCR: 10.0000 meq | EXTENDED_RELEASE_TABLET | Freq: Two times a day (BID) | ORAL | Status: DC

## 2015-02-15 MED ORDER — AMOXICILLIN-POT CLAVULANATE 875-125 MG PO TABS: 1.0000 | ORAL_TABLET | Freq: Two times a day (BID) | ORAL | Status: DC

## 2015-02-15 NOTE — MAU Note (Signed)
Urine in lab 

## 2015-02-15 NOTE — MAU Note (Signed)
Pt started getting chills and had an episode of vomiting around 0100 this morning.  Pt delivered on 7/5 induced at 4471w4d due to preeclampsia, on was on mag.

## 2015-02-15 NOTE — MAU Provider Note (Signed)
History     CSN: [id number]  Arrival date and time: 02/15/15 0830   None     Chief Complaint  Patient presents with  . Fever  . Chills  . Emesis   This is a 19 y.o. female who is 5 days postpartum who presents with c/o fever, chills, malaise, and vomiting. It started about 1am and has not improved. Vomiting intermittent. Fever and chills constant.  Has some pain in lower back and lower abdomen. Denies pain in breasts or upper abdomen. Denies leg pain.  History is remarkable for induction of labor for severe range BPs/preeclampsia, resulting in a Vacuum assisted vaginal delivery for maternal exhaustion, with a 1st degree laceration. She also had a postpartum hemorrhage with a loss of 1800 ml.  Full hemorrhage protocol was instituted.   Baby has been in NICU but is to be released soon. Mother is breastfeeding.    Fever  This is a new problem. The current episode started today. The problem has been unchanged. The maximum temperature noted was 103 to 103.9 F. The temperature was taken using an oral thermometer. Associated symptoms include abdominal pain, nausea and vomiting. Pertinent negatives include no chest pain, congestion, coughing, diarrhea, headaches, muscle aches, sore throat or urinary pain. She has tried nothing for the symptoms.  Abdominal Pain This is a new problem. The current episode started today. The onset quality is gradual. The problem occurs constantly. The problem has been unchanged. The pain is located in the LLQ and RLQ. The pain is moderate. The quality of the pain is aching and cramping. The abdominal pain radiates to the back. Associated symptoms include a fever, myalgias, nausea and vomiting. Pertinent negatives include no constipation, diarrhea, dysuria, frequency or headaches. She has tried nothing for the symptoms. There is no history of abdominal surgery. Vacuum delivery with PPH protocol  RN note:  Expand All Collapse All   Pt started getting chills and had an  episode of vomiting around 0100 this morning. Pt delivered on 7/5 induced at 537w4d due to preeclampsia, on was on mag.           OB History    Gravida Para Term Preterm AB TAB SAB Ectopic Multiple Living   1 1  1      0 1      Past Medical History  Diagnosis Date  . IBS (irritable bowel syndrome)   . Infertility, female   . PCOS (polycystic ovarian syndrome)   . Headache     Past Surgical History  Procedure Laterality Date  . No past surgeries      Family History  Problem Relation Age of Onset  . Diabetes Maternal Grandmother   . Hypertension Maternal Grandmother   . Hypertension Paternal Grandmother     History  Substance Use Topics  . Smoking status: Never Smoker   . Smokeless tobacco: Never Used  . Alcohol Use: No    Allergies: No Known Allergies  Prescriptions prior to admission  Medication Sig Dispense Refill Last Dose  . acetaminophen (TYLENOL) 500 MG tablet Take 1,000 mg by mouth every 6 (six) hours as needed for mild pain, moderate pain or headache.   02/07/2015 at Unknown time  . diphenhydramine-acetaminophen (TYLENOL PM) 25-500 MG TABS Take 2 tablets by mouth at bedtime as needed (sleep).    02/07/2015 at Unknown time  . Doxylamine-Pyridoxine 10-10 MG TBEC Take 2 tablets by mouth at bedtime. Take 2 tablets at bedtime. If symptoms persist, add 1 tab in the AM  starting on day 3. If symptoms persist, add 1 tab in the PM starting day 4. 90 tablet 1 02/07/2015 at Unknown time  . ferrous sulfate 325 (65 FE) MG tablet Take 1 tablet (325 mg total) by mouth 3 (three) times daily with meals. (Patient taking differently: Take 325 mg by mouth daily with breakfast. ) 90 tablet 3 Past Week at Unknown time  . ibuprofen (ADVIL,MOTRIN) 600 MG tablet Take 1 tablet (600 mg total) by mouth every 6 (six) hours as needed. 30 tablet 0   . Prenatal Vit-Fe Fumarate-FA (PRENATAL MULTIVITAMIN) TABS tablet Take 1 tablet by mouth daily at 12 noon.   02/07/2015 at Unknown time  . ranitidine  (ZANTAC) 150 MG capsule Take 1 capsule (150 mg total) by mouth daily. 60 capsule 2 02/08/2015 at Unknown time    Review of Systems  Constitutional: Positive for fever, chills and malaise/fatigue.  HENT: Negative for congestion and sore throat.   Eyes: Negative for blurred vision and double vision.  Respiratory: Negative for cough.   Cardiovascular: Negative for chest pain.  Gastrointestinal: Positive for nausea, vomiting and abdominal pain. Negative for diarrhea and constipation.  Genitourinary: Negative for dysuria, urgency, frequency and flank pain.  Musculoskeletal: Positive for myalgias and back pain.  Neurological: Negative for dizziness, focal weakness and headaches.   Physical Exam   Temperature 103.1 F (39.5 C), temperature source Oral, last menstrual period 06/07/2014, unknown if currently breastfeeding.  Physical Exam  Constitutional: She is oriented to person, place, and time. She appears well-developed and well-nourished. No distress.  HENT:  Head: Normocephalic.  Neck: Normal range of motion. Neck supple.  Cardiovascular: Normal rate and normal heart sounds.  Exam reveals no gallop and no friction rub.   No murmur heard. Respiratory: Effort normal and breath sounds normal. No respiratory distress. She has no wheezes. She has no rales. She exhibits no tenderness.  Bilateral breasts normal No erethema No induration or masses   GI: Soft. She exhibits no distension and no mass. There is tenderness (lower abdomen). There is no rebound and no guarding.  Genitourinary: Vaginal discharge (red lochia) found.  Uterus somewhat boggy and enlarged Cervix closed Uterus tender to palpation  Musculoskeletal: Normal range of motion. She exhibits edema (trace). She exhibits no tenderness (Negative homans).  Lymphadenopathy:    She has no cervical adenopathy.  Neurological: She is alert and oriented to person, place, and time.  Skin: Skin is warm and dry.  Psychiatric: She has a  normal mood and affect.    MAU Course  Procedures  MDM Results for orders placed or performed during the hospital encounter of 02/15/15 (from the past 24 hour(s))  CBC with Differential     Status: Abnormal (Preliminary result)   Collection Time: 02/15/15  9:20 AM  Result Value Ref Range   WBC 11.5 (H) 4.0 - 10.5 K/uL   RBC 3.20 (L) 3.87 - 5.11 MIL/uL   Hemoglobin 8.7 (L) 12.0 - 15.0 g/dL   HCT 13.0 (L) 86.5 - 78.4 %   MCV 83.1 78.0 - 100.0 fL   MCH 27.2 26.0 - 34.0 pg   MCHC 32.7 30.0 - 36.0 g/dL   RDW 69.6 29.5 - 28.4 %   Platelets 186 150 - 400 K/uL   Neutrophils Relative % 87 (H) 43 - 77 %   Neutro Abs 10.0 (H) 1.7 - 7.7 K/uL   Lymphocytes Relative 8 (L) 12 - 46 %   Lymphs Abs 0.9 0.7 - 4.0 K/uL   Monocytes  Relative 5 3 - 12 %   Monocytes Absolute 0.6 0.1 - 1.0 K/uL   Eosinophils Relative 0 0 - 5 %   Eosinophils Absolute 0.0 0.0 - 0.7 K/uL   Basophils Relative 0 0 - 1 %   Basophils Absolute 0.0 0.0 - 0.1 K/uL   Other PENDING %  Comprehensive metabolic panel     Status: Abnormal   Collection Time: 02/15/15  9:20 AM  Result Value Ref Range   Sodium 136 135 - 145 mmol/L   Potassium 2.8 (L) 3.5 - 5.1 mmol/L   Chloride 103 101 - 111 mmol/L   CO2 25 22 - 32 mmol/L   Glucose, Bld 105 (H) 65 - 99 mg/dL   BUN 7 6 - 20 mg/dL   Creatinine, Ser 4.09 0.44 - 1.00 mg/dL   Calcium 8.3 (L) 8.9 - 10.3 mg/dL   Total Protein 6.0 (L) 6.5 - 8.1 g/dL   Albumin 2.7 (L) 3.5 - 5.0 g/dL   AST 21 15 - 41 U/L   ALT 9 (L) 14 - 54 U/L   Alkaline Phosphatase 102 38 - 126 U/L   Total Bilirubin 0.6 0.3 - 1.2 mg/dL   GFR calc non Af Amer >60 >60 mL/min   GFR calc Af Amer >60 >60 mL/min   Anion gap 8 5 - 15  Amylase     Status: Abnormal   Collection Time: 02/15/15  9:20 AM  Result Value Ref Range   Amylase 26 (L) 28 - 100 U/L  Lipase, blood     Status: None   Collection Time: 02/15/15  9:20 AM  Result Value Ref Range   Lipase 24 22 - 51 U/L     Assessment and Plan  A:  Postpartum x 5  days with Fever/chills       Probable Postpartum Endometritis       Hypokalemia        Resolved preeclampsia  P:  Consulted Dr Despina Hidden       Discharge home        Ibuprofen for fever       Rx Augmentin  bid x 10 days       Continue breastfeeding        Push fluids       Will add Rx for K-Dur to replace postassium        Followup in office 1-2 weeks to check status   [facility] 02/15/2015, 9:00 AM

## 2015-02-15 NOTE — Discharge Instructions (Signed)
Hypokalemia Hypokalemia means that the amount of potassium in the blood is lower than normal.Potassium is a chemical, called an electrolyte, that helps regulate the amount of fluid in the body. It also stimulates muscle contraction and helps nerves function properly.Most of the body's potassium is inside of cells, and only a very small amount is in the blood. Because the amount in the blood is so small, minor changes can be life-threatening. CAUSES  Antibiotics.  Diarrhea or vomiting.  Using laxatives too much, which can cause diarrhea.  Chronic kidney disease.  Water pills (diuretics).  Eating disorders (bulimia).  Low magnesium level.  Sweating a lot. SIGNS AND SYMPTOMS  Weakness.  Constipation.  Fatigue.  Muscle cramps.  Mental confusion.  Skipped heartbeats or irregular heartbeat (palpitations).  Tingling or numbness. DIAGNOSIS  Your health care provider can diagnose hypokalemia with blood tests. In addition to checking your potassium level, your health care provider may also check other lab tests. TREATMENT Hypokalemia can be treated with potassium supplements taken by mouth or adjustments in your current medicines. If your potassium level is very low, you may need to get potassium through a vein (IV) and be monitored in the hospital. A diet high in potassium is also helpful. Foods high in potassium are:  Nuts, such as peanuts and pistachios.  Seeds, such as sunflower seeds and pumpkin seeds.  Peas, lentils, and lima beans.  Whole grain and bran cereals and breads.  Fresh fruit and vegetables, such as apricots, avocado, bananas, cantaloupe, kiwi, oranges, tomatoes, asparagus, and potatoes.  Orange and tomato juices.  Red meats.  Fruit yogurt. HOME CARE INSTRUCTIONS  Take all medicines as prescribed by your health care provider.  Maintain a healthy diet by including nutritious food, such as fruits, vegetables, nuts, whole grains, and lean meats.  If  you are taking a laxative, be sure to follow the directions on the label. SEEK MEDICAL CARE IF:  Your weakness gets worse.  You feel your heart pounding or racing.  You are vomiting or having diarrhea.  You are diabetic and having trouble keeping your blood glucose in the normal range. SEEK IMMEDIATE MEDICAL CARE IF:  You have chest pain, shortness of breath, or dizziness.  You are vomiting or having diarrhea for more than 2 days.  You faint. MAKE SURE YOU:   Understand these instructions.  Will watch your condition.  Will get help right away if you are not doing well or get worse. Document Released: 07/25/2005 Document Revised: 05/15/2013 Document Reviewed: 01/25/2013 [facility] Patient Information 2015 [employer] [place]. This information is not intended to replace advice given to you by your health care provider. Make sure you discuss any questions you have with your health care provider.  Endometritis Endometritis is an irritation, soreness, and swelling (inflammation) of the lining of the uterus (endometrium).   CAUSES   Bacterial infections  Having a miscarriage or childbirth, especially after a long labor or cesarean delivery.  Certain gynecological procedures (such as dilation and curettage, hysteroscopy, or contraceptive insertion).   SIGNS AND SYMPTOMS   Fever.  Lower abdominal or pelvic pain.  Abnormal vaginal discharge or bleeding.  Abdominal bloating (distention) or swelling.  General discomfort or ill feeling.  Discomfort with bowel movements. DIAGNOSIS  A physical and pelvic exam are performed. Other tests may include:  Cultures from the cervix.  Blood tests.  Examining a tissue sample of the uterine lining (endometrial biopsy).  Examining discharge under a microscope (wet prep).  Laparoscopy. TREATMENT  Antibiotic medicines  are usually given. Other treatments may include:  Fluids through an IV tube inserted in your vein.  Rest. HOME  CARE INSTRUCTIONS   Take over-the-counter or prescription medicines for pain, discomfort, or fever as directed by your health care provider.  Take your antibiotics as directed. Finish them even if you start to feel better.  Resume your normal diet and activities as directed or as tolerated.  Do not douche or have sexual intercourse until your health care provider says it is okay.  Do not have sexual intercourse until your partner has been treated if your endometritis is caused by an STI. SEEK IMMEDIATE MEDICAL CARE IF:   You have swelling or increasing pain in the abdomen.  You have a fever.  You have bad smelling vaginal discharge, or you have an increased amount of discharge.  You have abnormal vaginal bleeding.  Your medicine is not helping with the pain.  You experience any problems that may be related to the medicine you are taking.  You have nausea and vomiting, or you cannot keep foods down.  You have pain with bowel movements. MAKE SURE YOU:   Understand these instructions.  Will watch your condition.  Will get help right away if you are not doing well or get worse.   Document Released: 07/19/2001 Document Revised: 03/27/2013 Document Reviewed: 02/21/2013 [facility] Patient Information 2015 [place], [place]. This information is not intended to replace advice given to you by your health care provider. Make sure you discuss any questions you have with your health care provider.

## 2015-02-19 ENCOUNTER — Inpatient Hospital Stay (HOSPITAL_COMMUNITY): Admission: RE | Admit: 2015-02-19 | Payer: Medicaid Other | Source: Ambulatory Visit

## 2015-02-20 ENCOUNTER — Ambulatory Visit: Payer: Medicaid Other | Admitting: Obstetrics & Gynecology

## 2015-02-20 VITALS — BP 127/81 | HR 81 | Temp 98.5°F | Ht 61.0 in | Wt 166.0 lb

## 2015-02-20 DIAGNOSIS — N719 Inflammatory disease of uterus, unspecified: Secondary | ICD-10-CM

## 2015-02-20 NOTE — Progress Notes (Signed)
Here today for hospital follow up for endometritis.  Also needs refill on her Zantac.

## 2015-02-25 ENCOUNTER — Ambulatory Visit: Payer: Medicaid Other | Admitting: Obstetrics & Gynecology

## 2015-03-25 ENCOUNTER — Emergency Department (HOSPITAL_COMMUNITY)
Admission: EM | Admit: 2015-03-25 | Discharge: 2015-03-25 | Disposition: A | Discharge status: Home / Self Care | Payer: Medicaid Other | Attending: Physician Assistant | Admitting: Physician Assistant

## 2015-03-25 ENCOUNTER — Encounter: Payer: Self-pay | Admitting: Certified Nurse Midwife

## 2015-03-25 ENCOUNTER — Encounter (HOSPITAL_COMMUNITY): Payer: Self-pay | Admitting: Emergency Medicine

## 2015-03-25 ENCOUNTER — Ambulatory Visit (INDEPENDENT_AMBULATORY_CARE_PROVIDER_SITE_OTHER): Payer: Medicaid Other | Admitting: Certified Nurse Midwife

## 2015-03-25 VITALS — BP 113/74 | HR 79 | Resp 18 | Ht 61.0 in | Wt 162.0 lb

## 2015-03-25 DIAGNOSIS — Z8742 Personal history of other diseases of the female genital tract: Secondary | ICD-10-CM | POA: Diagnosis not present

## 2015-03-25 DIAGNOSIS — Z8719 Personal history of other diseases of the digestive system: Secondary | ICD-10-CM | POA: Diagnosis not present

## 2015-03-25 DIAGNOSIS — R1013 Epigastric pain: Secondary | ICD-10-CM | POA: Diagnosis present

## 2015-03-25 DIAGNOSIS — Z8639 Personal history of other endocrine, nutritional and metabolic disease: Secondary | ICD-10-CM | POA: Diagnosis not present

## 2015-03-25 DIAGNOSIS — R740 Nonspecific elevation of levels of transaminase and lactic acid dehydrogenase [LDH]: Secondary | ICD-10-CM | POA: Diagnosis not present

## 2015-03-25 DIAGNOSIS — Z3202 Encounter for pregnancy test, result negative: Secondary | ICD-10-CM | POA: Diagnosis not present

## 2015-03-25 DIAGNOSIS — R197 Diarrhea, unspecified: Secondary | ICD-10-CM | POA: Insufficient documentation

## 2015-03-25 DIAGNOSIS — R7401 Elevation of levels of liver transaminase levels: Secondary | ICD-10-CM

## 2015-03-25 DIAGNOSIS — R110 Nausea: Secondary | ICD-10-CM | POA: Diagnosis not present

## 2015-03-25 DIAGNOSIS — R300 Dysuria: Secondary | ICD-10-CM | POA: Diagnosis not present

## 2015-03-25 LAB — URINALYSIS, ROUTINE W REFLEX MICROSCOPIC
BILIRUBIN URINE: NEGATIVE
Glucose, UA: NEGATIVE mg/dL
Ketones, ur: NEGATIVE mg/dL
NITRITE: NEGATIVE
Protein, ur: NEGATIVE mg/dL
SPECIFIC GRAVITY, URINE: 1.02 (ref 1.005–1.030)
Urobilinogen, UA: 0.2 mg/dL (ref 0.0–1.0)
pH: 6.5 (ref 5.0–8.0)

## 2015-03-25 LAB — CBC WITH DIFFERENTIAL/PLATELET
BASOS ABS: 0 10*3/uL (ref 0.0–0.1)
Basophils Relative: 0 % (ref 0–1)
EOS ABS: 0.2 10*3/uL (ref 0.0–0.7)
Eosinophils Relative: 1 % (ref 0–5)
HCT: 33.1 % — ABNORMAL LOW (ref 36.0–46.0)
Hemoglobin: 10.5 g/dL — ABNORMAL LOW (ref 12.0–15.0)
Lymphocytes Relative: 23 % (ref 12–46)
Lymphs Abs: 2.7 10*3/uL (ref 0.7–4.0)
MCH: 26.4 pg (ref 26.0–34.0)
MCHC: 31.7 g/dL (ref 30.0–36.0)
MCV: 83.4 fL (ref 78.0–100.0)
Monocytes Absolute: 0.6 10*3/uL (ref 0.1–1.0)
Monocytes Relative: 6 % (ref 3–12)
NEUTROS ABS: 8 10*3/uL — AB (ref 1.7–7.7)
Neutrophils Relative %: 70 % (ref 43–77)
PLATELETS: 395 10*3/uL (ref 150–400)
RBC: 3.97 MIL/uL (ref 3.87–5.11)
RDW: 14.3 % (ref 11.5–15.5)
WBC: 11.5 10*3/uL — ABNORMAL HIGH (ref 4.0–10.5)

## 2015-03-25 LAB — POCT URINALYSIS DIPSTICK
Bilirubin, UA: NEGATIVE
Glucose, UA: NEGATIVE
Ketones, UA: NEGATIVE
Nitrite, UA: NEGATIVE
Protein, UA: NEGATIVE
Spec Grav, UA: <=1.005
Urobilinogen, UA: NEGATIVE
pH, UA: 6

## 2015-03-25 LAB — COMPREHENSIVE METABOLIC PANEL
ALT: 13 U/L — ABNORMAL LOW (ref 14–54)
ANION GAP: 9 (ref 5–15)
AST: 55 U/L — AB (ref 15–41)
Albumin: 3.7 g/dL (ref 3.5–5.0)
Alkaline Phosphatase: 105 U/L (ref 38–126)
BUN: 6 mg/dL (ref 6–20)
CHLORIDE: 102 mmol/L (ref 101–111)
CO2: 27 mmol/L (ref 22–32)
Calcium: 9.1 mg/dL (ref 8.9–10.3)
Creatinine, Ser: 0.8 mg/dL (ref 0.44–1.00)
GFR calc Af Amer: >60 mL/min (ref >60)
GFR calc non Af Amer: >60 mL/min (ref >60)
GLUCOSE: 109 mg/dL — AB (ref 65–99)
Potassium: 3.7 mmol/L (ref 3.5–5.1)
SODIUM: 138 mmol/L (ref 135–145)
Total Bilirubin: 0.4 mg/dL (ref 0.3–1.2)
Total Protein: 7.1 g/dL (ref 6.5–8.1)

## 2015-03-25 LAB — POC URINE PREG, ED: Preg Test, Ur: NEGATIVE

## 2015-03-25 LAB — HCG, SERUM, QUALITATIVE: PREG SERUM: NEGATIVE

## 2015-03-25 LAB — URINE MICROSCOPIC-ADD ON

## 2015-03-25 LAB — LIPASE, BLOOD: LIPASE: 36 U/L (ref 22–51)

## 2015-03-25 MED ORDER — SODIUM CHLORIDE 0.9 % IV BOLUS (SEPSIS): 1000.0000 mL | Freq: Once | INTRAVENOUS | Status: DC

## 2015-03-25 MED ORDER — GI COCKTAIL ~~LOC~~
30.0000 mL | Freq: Once | ORAL | Status: AC
  Administered 2015-03-25: 30 mL via ORAL
  Filled 2015-03-25: qty 30

## 2015-03-25 MED ORDER — ETONOGESTREL-ETHINYL ESTRADIOL 0.12-0.015 MG/24HR VA RING: VAGINAL_RING | VAGINAL | Status: DC

## 2015-03-25 NOTE — Patient Instructions (Signed)

## 2015-03-25 NOTE — ED Provider Notes (Signed)
CSN: [id number]     Arrival date & time 03/25/15  2058 History  This chart was scribed for Analeese Andreatta Randall An, MD by Placido Sou, ED scribe. This patient was seen in room APA05/APA05 and the patient's care was started at 9:46 PM.   Chief Complaint  Patient presents with  . Abdominal Pain   The history is provided by the patient. No language interpreter was used.    HPI Comments: Chelsea Ferguson is a 19 y.o. female, with a hx of IBS and PCOS, who presents to the Emergency Department complaining of worsening, mild, radiating, epigastric pain with intermittent symptoms beginning a few weeks ago and worsening symptoms beginning at 7:00 PM tonight. Pt describes the feeling as "gassy" and further notes a radiation of pain to her back. Pt notes associated nausea but denies any vomiting. Pt notes taking one oral Alka Seltzer which provided no relief and denies taking any medications for pain management. Pt notes her last BM was loose and a bright yellow. She notes her LNMP was 5 days ago. Pt denies any vomiting.   Past Medical History  Diagnosis Date  . IBS (irritable bowel syndrome)   . Infertility, female   . PCOS (polycystic ovarian syndrome)   . Headache    Past Surgical History  Procedure Laterality Date  . No past surgeries     Family History  Problem Relation Age of Onset  . Diabetes Maternal Grandmother   . Hypertension Maternal Grandmother   . Hypertension Paternal Grandmother    Social History  Substance Use Topics  . Smoking status: Never Smoker   . Smokeless tobacco: Never Used  . Alcohol Use: No   OB History    Gravida Para Term Preterm AB TAB SAB Ectopic Multiple Living   1 1  1      0 1     Review of Systems  Gastrointestinal: Positive for nausea, abdominal pain and diarrhea. Negative for vomiting and constipation.  All other systems reviewed and are negative.  Allergies  Review of patient's allergies indicates no known allergies.  Home Medications    Prior to Admission medications   Medication Sig Start Date End Date Taking? Authorizing Provider  ibuprofen (ADVIL,MOTRIN) 600 MG tablet Take 1 tablet (600 mg total) by mouth every 6 (six) hours as needed. Patient taking differently: Take 600 mg by mouth every 6 (six) hours as needed for fever or moderate pain.  02/13/15  Yes Erasmo Downer, MD  etonogestrel-ethinyl estradiol (NUVARING) 0.12-0.015 MG/24HR vaginal ring Insert vaginally and leave in place for 3 consecutive weeks, then remove for 1 week. 03/25/15   Lori A Clemmons, CNM   BP 110/57 mmHg  Pulse 66  Temp(Src) 97.7 F (36.5 C) (Oral)  Resp 18  Ht 5\' 1"  (1.549 m)  Wt 163 lb (73.936 kg)  BMI 30.81 kg/m2  SpO2 96%  LMP 03/20/2015 Physical Exam  Constitutional: She is oriented to person, place, and time. She appears well-developed and well-nourished. No distress.  HENT:  Head: Normocephalic and atraumatic.  Mouth/Throat: No oropharyngeal exudate.  Eyes: Right eye exhibits no discharge. Left eye exhibits no discharge.  Neck: Normal range of motion. No tracheal deviation present.  Cardiovascular: Normal rate.   Pulmonary/Chest: Effort normal. No respiratory distress.  Musculoskeletal: Normal range of motion.  Neurological: She is alert and oriented to person, place, and time.  Skin: Skin is warm and dry. She is not diaphoretic.  Psychiatric: She has a normal mood and affect. Her behavior is  normal.  Nursing note and vitals reviewed.   ED Course  Procedures  DIAGNOSTIC STUDIES: Oxygen Saturation is 100% on RA, normal by my interpretation.    COORDINATION OF CARE: 9:50 PM Discussed treatment plan with pt at bedside and pt agreed to plan.  Labs Review Labs Reviewed  CBC WITH DIFFERENTIAL/PLATELET - Abnormal; Notable for the following:    WBC 11.5 (*)    Hemoglobin 10.5 (*)    HCT 33.1 (*)    Neutro Abs 8.0 (*)    All other components within normal limits  COMPREHENSIVE METABOLIC PANEL - Abnormal; Notable for the  following:    Glucose, Bld 109 (*)    AST 55 (*)    ALT 13 (*)    All other components within normal limits  URINALYSIS, ROUTINE W REFLEX MICROSCOPIC (NOT AT [facility]) - Abnormal; Notable for the following:    APPearance TURBID (*)    Hgb urine dipstick SMALL (*)    Leukocytes, UA TRACE (*)    All other components within normal limits  URINE MICROSCOPIC-ADD ON - Abnormal; Notable for the following:    Squamous Epithelial / LPF MANY (*)    Bacteria, UA MANY (*)    All other components within normal limits  LIPASE, BLOOD  HCG, SERUM, QUALITATIVE  POC URINE PREG, ED  POC URINE PREG, ED    Imaging Review No results found. I have personally reviewed and evaluated these images and lab results as part of my medical decision-making.   EKG Interpretation None      MDM   Final diagnoses:  Transaminitis    Patient is a 19 year old female presenting with epigastric pain. It started today. It did not start after eating. It sounds to me to be more like gastritis versus PUD versus gallstones. We will get labs. There is no ultrasound here overnight we will set patient up with ultrasound tomorrow. Given her normal labs I suspect more so that it is gastritis especially since she got better with the GI cocktail.  I personally performed the services described in this documentation, which was scribed in my presence. The recorded information has been reviewed and is accurate.    Dashon Mcintire Randall An, MD 03/26/15 1610

## 2015-03-25 NOTE — Discharge Instructions (Signed)
Use malox or other coating medication to help with the epigastric pain.Please return as planned tomorrow morning for [place] of your gallbladder.

## 2015-03-25 NOTE — Progress Notes (Signed)
Subjective:     Chelsea Ferguson is a 19 y.o. female who presents for a postpartum visit. She is 6 weeks postpartum following a vacuumn delivery and post partum hemmorrhage. I have fully reviewed the prenatal and intrapartum course. Outcome: vacuum, low. Anesthesia: epidural. Postpartum course has included postpartum endometritis. Baby's course has been uneventful. Baby is feeding by bottle. Bleeding no bleeding. Bowel function is normal. Bladder function is normal. Patient is not sexually active. Contraception method is NuvaRing vaginal inserts. Postpartum depression screening: negative.  &D Delivery Note by Levie Heritage, DO at 02/10/2015 8:40 PM    Author: Levie Heritage, DO Service: Obstetrics/Gynecology Author Type: Physician   Filed: 02/10/2015 9:10 PM Note Time: 02/10/2015 8:40 PM Status: Addendum   Editor: Levie Heritage, DO (Physician)     Related Notes: Original Note by Levie Heritage, DO (Physician) filed at 02/10/2015 9:06 PM   Expand All Collapse All   Operative Delivery Note At 7:34 PM a viable female was delivered via Vaginal, Vacuum (Extractor). Presentation: vertex; Position: Right,, Occiput,, Anterior; Station: +3.  Verbal consent: obtained from patient. Risks and benefits discussed in detail. Risks include, but are not limited to the risks of anesthesia, bleeding, infection, damage to maternal tissues, fetal cephalhematoma. There is also the risk of inability to effect vaginal delivery of the head, or shoulder dystocia that cannot be resolved by established maneuvers, leading to the need for emergency cesarean section.  APGAR: 3, 8; weight 5 lb 3.6 oz (2370 g).  Placenta status: Intact, Spontaneous.  Cord: 3 vessels with the following complications: none.   VAVD done for maternal exhaustion. Kiwi Vacuum extractor placed at +3 station 3cm posterior from anterior fontenelle. At the onset of the next contraction, the Vacuum was increased to Hg. One pull  was done along the axis of the pelvis and the head was delivered. The vacuum was discontinued, due to mild shoulder dystocia, the patient was laid back and McRoberts was done and the remainder of the infant was delivered. The baby was delivered to mother's abdomen. Primary resuscitation started with stimulation. With low Apgar, cord clamped and cut and handed off to warmer. Cord blood gas obtained (arterial). Due to extensive bleeding, cytotec PR given, along with Hemabate IM. Second line initiated and code hemorrhage called. Second dose of Hemabate given. Uterine tone improved and bleeding slowed. The patient continued to have some mild to moderate flow. Bakari balloon placement was trialed, but was unsuccessful despite multiple attempts. Ultrasound was used in attempt for guidance and it was noticed that her bladder was distended. The foley catheter was adjusted and drained the bladder. Bleeding improved after that with continued uterine tone.   UA cord pH: 7.29  An hour of direct critical care time was preformed, including life saving maneuvers to prevent hemorrhagic shock.  Anesthesia: Epidural  Instruments: none Episiotomy: None Lacerations: 1st degree Suture Repair: 3.0 vicryl rapide Est. Blood Loss (mL): 1800 mL  Mom to postpartum. Baby to Couplet care / Skin to Skin.   Candelaria Celeste JEHIEL 02/10/2015, 8:41 PM                 Review of Systems Pertinent items are noted in HPI.   Objective:    BP 113/74 mmHg  Pulse 79  Resp 18  Wt 162 lb (73.483 kg)  LMP 03/20/2015  General:  alert and cooperative   Breasts:  inspection negative, no nipple discharge or bleeding, no masses or nodularity palpable  Lungs:  clear to auscultation bilaterally  Heart:  regular rate and rhythm, S1, S2 normal, no murmur, click, rub or gallop  Abdomen: soft, non-tender; bowel sounds normal; no masses,  no organomegaly   Vulva:  normal  Vagina: normal vagina   Cervix:  no cervical motion tenderness  Corpus: normal  Adnexa:  normal adnexa  Rectal Exam: Not performed.        Assessment:     6 week postpartum exam.   Plan:    1. Contraception: NuvaRing vaginal inserts 2.  3. Follow up in: 1 year or as needed.

## 2015-03-25 NOTE — ED Notes (Signed)
Pt refused IV Fluids at this time.

## 2015-03-25 NOTE — ED Notes (Signed)
Spoke to [place], Radiology - she states that patient has RUQ Ultrasound scheduled for Thursday 8/18 @ 1045 - updated patient and instructed pt to present to Radiology Department for testing. Updated EDP.

## 2015-03-25 NOTE — ED Notes (Signed)
Pt c/o epigastric pain that radiates to back since 1930 with nausea.

## 2015-03-25 NOTE — Progress Notes (Signed)
Patient ID: Chelsea Ferguson, female   DOB: [birth year], 19 y.o.   MRN: [id number] Pt seen at [facility] on 02/15/15 for endometritis.

## 2015-03-26 ENCOUNTER — Inpatient Hospital Stay (HOSPITAL_COMMUNITY): Admit: 2015-03-26 | Payer: Medicaid Other

## 2015-03-26 NOTE — Progress Notes (Signed)
Pt no showed for appt..... 

## 2015-03-27 ENCOUNTER — Ambulatory Visit (HOSPITAL_COMMUNITY)
Admission: RE | Admit: 2015-03-27 | Discharge: 2015-03-27 | Disposition: A | Discharge status: Home / Self Care | Payer: Medicaid Other | Source: Ambulatory Visit | Attending: Physician Assistant | Admitting: Physician Assistant

## 2015-03-27 DIAGNOSIS — R1011 Right upper quadrant pain: Secondary | ICD-10-CM | POA: Diagnosis present

## 2015-03-27 DIAGNOSIS — R938 Abnormal findings on diagnostic imaging of other specified body structures: Secondary | ICD-10-CM | POA: Diagnosis not present

## 2015-03-27 DIAGNOSIS — R932 Abnormal findings on diagnostic imaging of liver and biliary tract: Secondary | ICD-10-CM | POA: Insufficient documentation

## 2015-03-27 LAB — URINE CULTURE: Colony Count: >=100000

## 2015-03-27 NOTE — ED Provider Notes (Signed)
Patient returns for right upper quadrant ultrasound. 9 Millimeter echogenic structure in the gallbladder could be gallstone versus polyp. One-year follow-up recommended. Patient expressed understanding.  Chelsea Octave, MD 03/27/15 [phone]

## 2015-04-02 ENCOUNTER — Telehealth: Payer: Self-pay | Admitting: *Deleted

## 2015-04-02 DIAGNOSIS — N390 Urinary tract infection, site not specified: Secondary | ICD-10-CM

## 2015-04-02 MED ORDER — NITROFURANTOIN MONOHYD MACRO 100 MG PO CAPS: 100.0000 mg | ORAL_CAPSULE | Freq: Two times a day (BID) | ORAL | Status: DC

## 2015-04-02 NOTE — Telephone Encounter (Signed)
-----   Message from [employer] sent at 04/01/2015  9:50 AM EDT ----- Pt needs from lab work the other day. Please call back when you get a chance.  Thank you!

## 2015-04-02 NOTE — Telephone Encounter (Signed)
Telephone call to patient regarding UTI, verbal order from [facility], CNM to treat with Macrobid  BID x 7 days. Patient notified to pick up Rx.

## 2015-04-02 NOTE — Telephone Encounter (Signed)
Verified meds to be sent in with Dr. Marice Potter

## 2015-04-14 ENCOUNTER — Telehealth: Payer: Self-pay | Admitting: Obstetrics & Gynecology

## 2015-04-14 NOTE — Telephone Encounter (Signed)
Pt came in to get medication for UTI; has taken all of her medicine and is still displaying sxs. Pt was wondering if she could get something called in again to completely get rid of it. Please advise.

## 2015-04-16 ENCOUNTER — Other Ambulatory Visit (INDEPENDENT_AMBULATORY_CARE_PROVIDER_SITE_OTHER): Payer: Medicaid Other | Admitting: *Deleted

## 2015-04-16 DIAGNOSIS — R300 Dysuria: Secondary | ICD-10-CM

## 2015-04-16 LAB — POCT URINALYSIS DIPSTICK
GLUCOSE UA: NEGATIVE
NITRITE UA: NEGATIVE
SPEC GRAV UA: 1.015
UROBILINOGEN UA: 0.2
pH, UA: 6.5

## 2015-04-16 MED ORDER — SULFAMETHOXAZOLE-TRIMETHOPRIM 800-160 MG PO TABS: 1.0000 | ORAL_TABLET | Freq: Two times a day (BID) | ORAL | Status: DC

## 2015-04-16 NOTE — Progress Notes (Signed)
Pt treated for UTI on 8/25 with Macrobid for 7 days, pt still c/o urinary symptoms.  Urinalysis result = moderate leukocytes and trace blood, will send urine cx.  Last urine cx was + e- coli and sensitive to sulfa.  Will call in Septra DS for 3 days per protocol.  Pt notified.

## 2015-04-19 LAB — URINE CULTURE

## 2015-04-20 NOTE — Telephone Encounter (Signed)
-----   Message from Myra C Dove, MD sent at 04/20/2015  9:39 AM EDT ----- Please call her in bactrim ds for a week BID. Thanks 

## 2015-04-20 NOTE — Telephone Encounter (Signed)
-----   Message from Allie Bossier, MD sent at 04/20/2015  9:39 AM EDT ----- Please call her in bactrim ds for a week BID. Thanks

## 2015-04-20 NOTE — Telephone Encounter (Signed)
Looks like meds have already been sent to pharm.

## 2015-04-21 NOTE — Telephone Encounter (Signed)
Spoke to pt, she has completed the round of Bactrim DS that was sent to the pharmacy on Friday 9-9, pt states she is feeling much better at this time but if she starts having symptoms she will call the office for [place] to call in the week supply of Bactrim.

## 2015-04-27 ENCOUNTER — Telehealth: Payer: Self-pay | Admitting: *Deleted

## 2015-04-27 DIAGNOSIS — N390 Urinary tract infection, site not specified: Secondary | ICD-10-CM

## 2015-04-27 MED ORDER — SULFAMETHOXAZOLE-TRIMETHOPRIM 800-160 MG PO TABS: 1.0000 | ORAL_TABLET | Freq: Two times a day (BID) | ORAL | Status: DC

## 2015-04-27 NOTE — Telephone Encounter (Signed)
Patient called and said she still had symptoms of a UTI so I have sent in a weeks worth of Bactrim DS per last note.

## 2015-05-07 ENCOUNTER — Ambulatory Visit: Payer: Medicaid Other | Admitting: Family Medicine

## 2015-05-19 ENCOUNTER — Telehealth: Payer: Self-pay | Admitting: *Deleted

## 2015-05-19 DIAGNOSIS — R300 Dysuria: Secondary | ICD-10-CM

## 2015-05-19 MED ORDER — PHENAZOPYRIDINE HCL 200 MG PO TABS: 200.0000 mg | ORAL_TABLET | Freq: Three times a day (TID) | ORAL | Status: DC | PRN

## 2015-05-19 MED ORDER — LEVOFLOXACIN 750 MG PO TABS: 750.0000 mg | ORAL_TABLET | Freq: Every day | ORAL | Status: DC

## 2015-05-19 NOTE — Telephone Encounter (Signed)
Pt having dysuria, was treated with Septra DS with last urine cx + for E-coli.  Had Dr Macon Large review Urine cx, recommended using something that was stronger and more sensitive to Chelsea Ferguson, sent rx for Levaquin to pharmacy.  Instructed pt on use, if after treatment she continues to have symptoms we will refer to Urologist.  Pt acknowledged.

## 2015-06-12 ENCOUNTER — Telehealth: Payer: Self-pay | Admitting: *Deleted

## 2015-06-12 DIAGNOSIS — B379 Candidiasis, unspecified: Secondary | ICD-10-CM

## 2015-06-12 MED ORDER — FLUCONAZOLE 150 MG PO TABS: 150.0000 mg | ORAL_TABLET | Freq: Once | ORAL | Status: DC

## 2015-06-12 NOTE — Telephone Encounter (Signed)
I have sent in Diflucan to patients pharmacy.  Pt aware.

## 2015-06-12 NOTE — Telephone Encounter (Signed)
-----   Message from Olevia BowensJacinda S Battle sent at 06/12/2015  8:03 AM EDT ----- Regarding: Rx Request Contact: [phone] Patient called states she is being treated for a UTI but now has symptoms of a yeast infection wants to know if she can have something called in. Uses [facility] in [place]

## 2015-10-15 ENCOUNTER — Other Ambulatory Visit (HOSPITAL_COMMUNITY): Payer: Self-pay | Admitting: Family

## 2015-10-15 ENCOUNTER — Ambulatory Visit (HOSPITAL_COMMUNITY)
Admission: RE | Admit: 2015-10-15 | Discharge: 2015-10-15 | Disposition: A | Discharge status: Home / Self Care | Payer: Self-pay | Source: Ambulatory Visit | Attending: Family | Admitting: Family

## 2015-10-15 DIAGNOSIS — M79641 Pain in right hand: Secondary | ICD-10-CM | POA: Insufficient documentation

## 2015-12-16 ENCOUNTER — Telehealth: Payer: Self-pay | Admitting: *Deleted

## 2015-12-16 NOTE — Telephone Encounter (Signed)
Called pt to adv she should not be having issues after 10 months. She would need to be evaluated in office. Pt was trying to avoid coming in office but understands there is nothing that can be diagnosed over the phone.

## 2015-12-16 NOTE — Telephone Encounter (Signed)
-----   Message from Olevia BowensJacinda S Battle sent at 12/16/2015  9:14 AM EDT ----- Regarding: Advise Contact: [phone] Pt had a 4 degree laceration about 10 months ago, currently states she is bleeding where her stitches were, tender to touch, burning when urinating. She states she has been using the stuff the hospital gave her after birth. Currently no insurance, trying to avoid being seen in office

## 2016-02-25 ENCOUNTER — Other Ambulatory Visit (HOSPITAL_COMMUNITY): Payer: Self-pay | Admitting: Emergency Medicine

## 2016-02-25 DIAGNOSIS — G44011 Episodic cluster headache, intractable: Secondary | ICD-10-CM

## 2016-02-29 ENCOUNTER — Ambulatory Visit (HOSPITAL_COMMUNITY)
Admission: RE | Admit: 2016-02-29 | Discharge: 2016-02-29 | Disposition: A | Discharge status: Home / Self Care | Payer: Medicaid Other | Source: Ambulatory Visit | Attending: Emergency Medicine | Admitting: Emergency Medicine

## 2016-02-29 DIAGNOSIS — G44011 Episodic cluster headache, intractable: Secondary | ICD-10-CM | POA: Insufficient documentation

## 2016-02-29 MED ORDER — GADOBENATE DIMEGLUMINE 529 MG/ML IV SOLN
20.0000 mL | Freq: Once | INTRAVENOUS | Status: AC | PRN
  Administered 2016-02-29: 17 mL via INTRAVENOUS

## 2016-03-10 ENCOUNTER — Ambulatory Visit (HOSPITAL_COMMUNITY): Payer: Medicaid Other

## 2016-05-18 ENCOUNTER — Other Ambulatory Visit (HOSPITAL_COMMUNITY): Payer: Self-pay | Admitting: Family Medicine

## 2016-05-18 DIAGNOSIS — R109 Unspecified abdominal pain: Secondary | ICD-10-CM

## 2016-05-18 DIAGNOSIS — D649 Anemia, unspecified: Secondary | ICD-10-CM

## 2016-05-18 DIAGNOSIS — E282 Polycystic ovarian syndrome: Secondary | ICD-10-CM

## 2016-05-18 DIAGNOSIS — F419 Anxiety disorder, unspecified: Secondary | ICD-10-CM

## 2016-05-18 DIAGNOSIS — N898 Other specified noninflammatory disorders of vagina: Secondary | ICD-10-CM

## 2016-05-18 DIAGNOSIS — K589 Irritable bowel syndrome without diarrhea: Secondary | ICD-10-CM

## 2016-05-18 DIAGNOSIS — K824 Cholesterolosis of gallbladder: Secondary | ICD-10-CM

## 2016-05-25 ENCOUNTER — Ambulatory Visit (HOSPITAL_COMMUNITY)
Admission: RE | Admit: 2016-05-25 | Discharge: 2016-05-25 | Disposition: A | Discharge status: Home / Self Care | Payer: Medicaid Other | Source: Ambulatory Visit | Attending: Family Medicine | Admitting: Family Medicine

## 2016-05-25 DIAGNOSIS — K769 Liver disease, unspecified: Secondary | ICD-10-CM | POA: Insufficient documentation

## 2016-05-25 DIAGNOSIS — K824 Cholesterolosis of gallbladder: Secondary | ICD-10-CM

## 2016-05-25 DIAGNOSIS — Z8719 Personal history of other diseases of the digestive system: Secondary | ICD-10-CM | POA: Insufficient documentation

## 2016-05-25 DIAGNOSIS — N898 Other specified noninflammatory disorders of vagina: Secondary | ICD-10-CM

## 2016-05-25 DIAGNOSIS — R109 Unspecified abdominal pain: Secondary | ICD-10-CM

## 2016-05-25 DIAGNOSIS — E282 Polycystic ovarian syndrome: Secondary | ICD-10-CM

## 2016-05-25 DIAGNOSIS — D649 Anemia, unspecified: Secondary | ICD-10-CM

## 2016-05-25 DIAGNOSIS — K589 Irritable bowel syndrome without diarrhea: Secondary | ICD-10-CM

## 2016-05-25 DIAGNOSIS — F419 Anxiety disorder, unspecified: Secondary | ICD-10-CM

## 2016-07-02 IMAGING — US US FETAL BPP W/O NONSTRESS
1 series · 12 of 12 positions shown · non-contrast
Comparison: none

[Series 1: us fetal bpp w/o nonstress · non-contrast · 12 acquisitions, 12 frames shown]
[im 1/12]
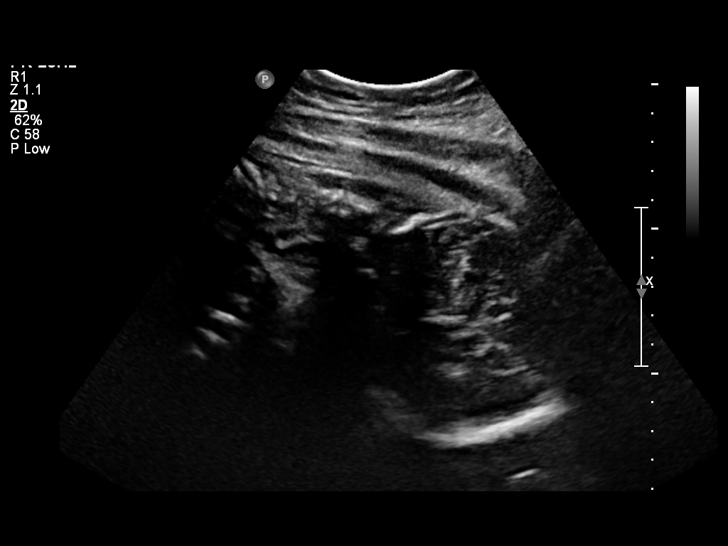
[im 2/12]
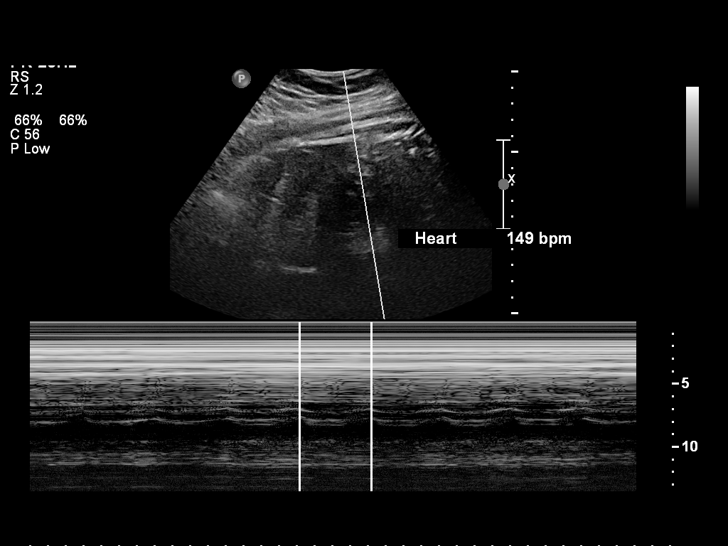
[im 3/12]
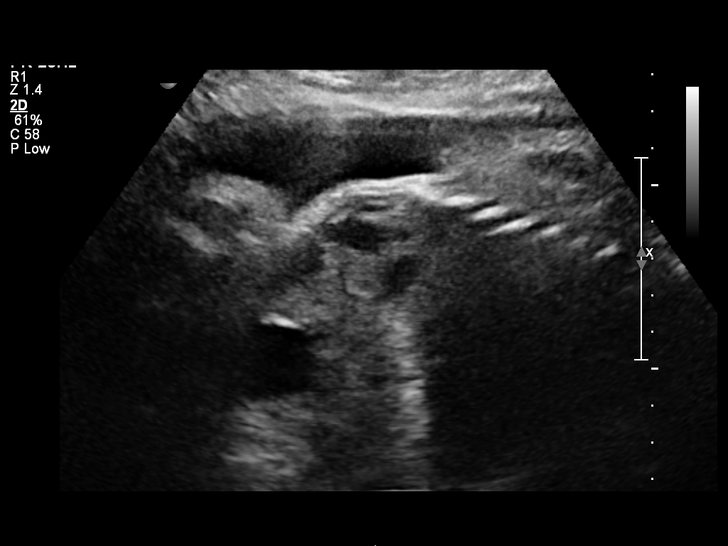
[im 4/12]
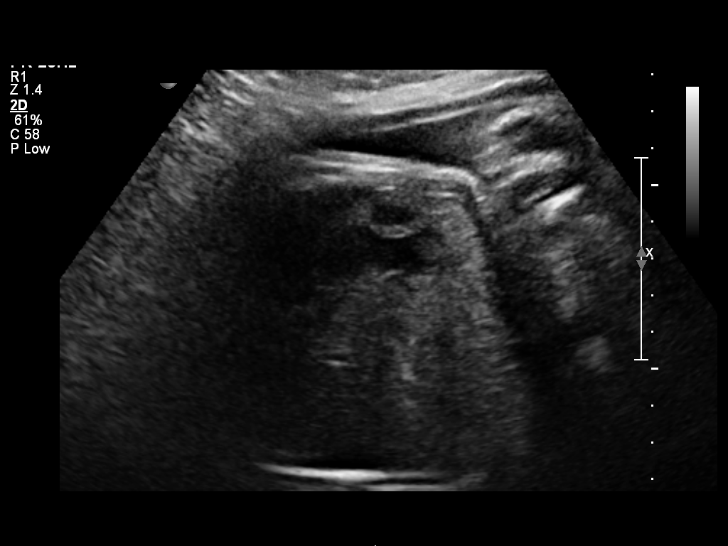
[im 5/12]
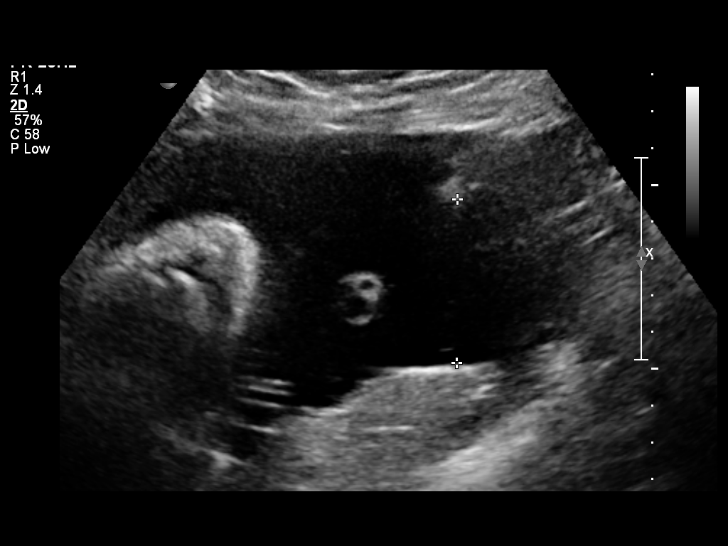
[im 6/12]
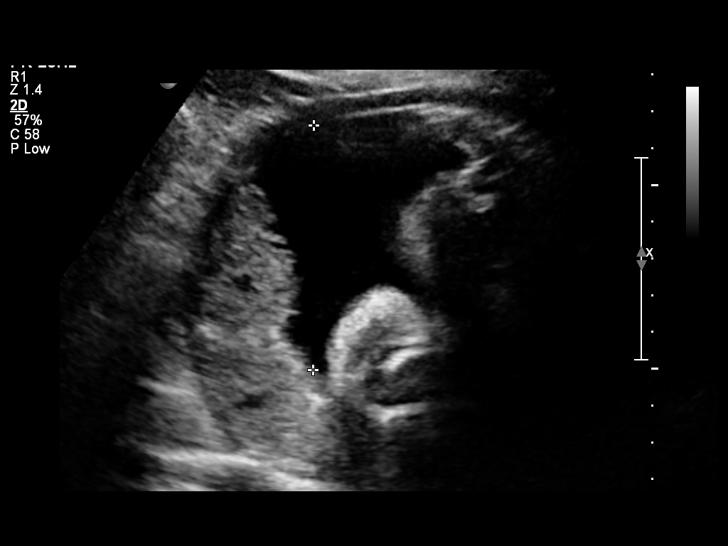
[im 7/12]
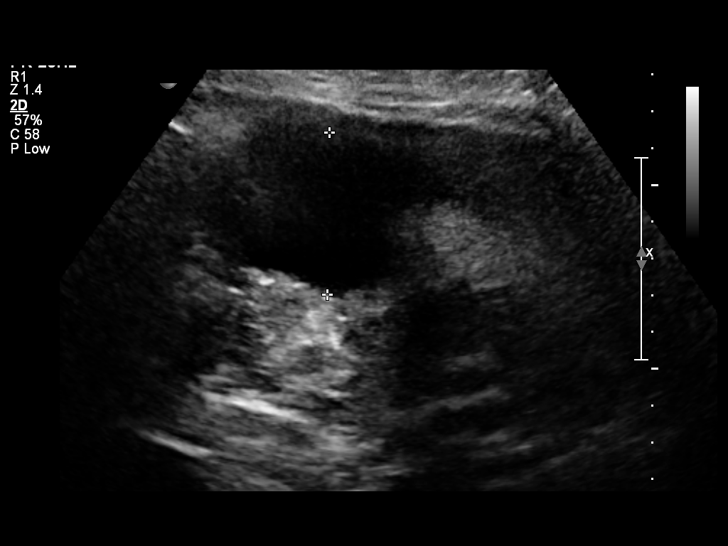
[im 8/12]
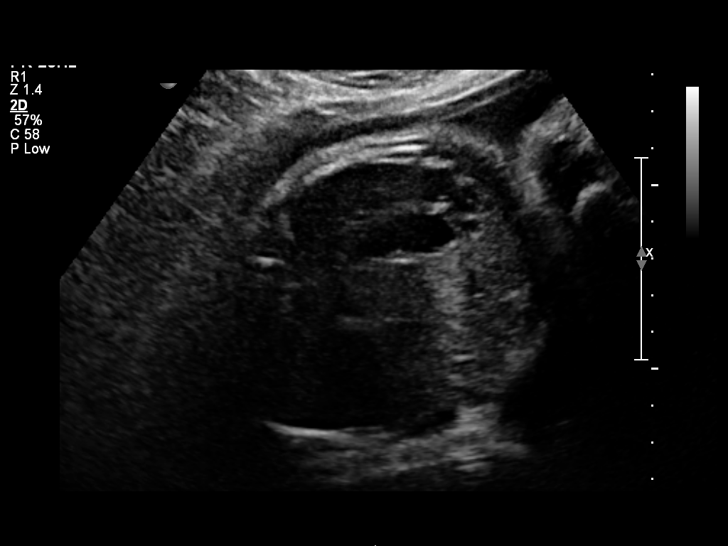
[im 9/12]
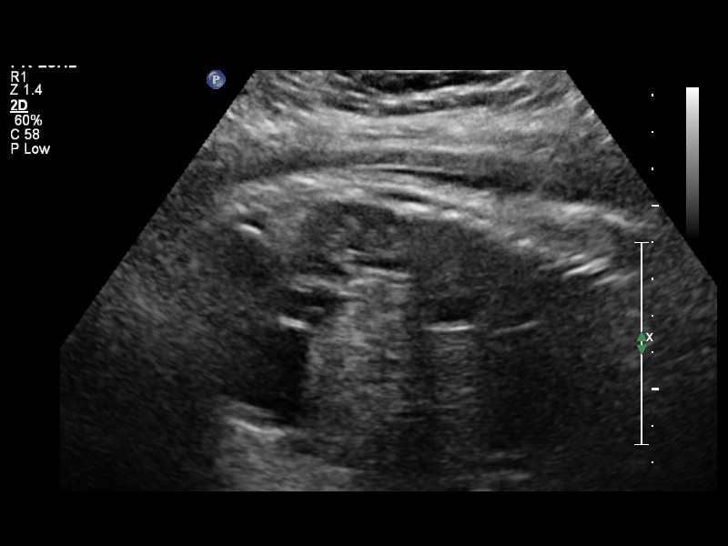
[im 10/12]
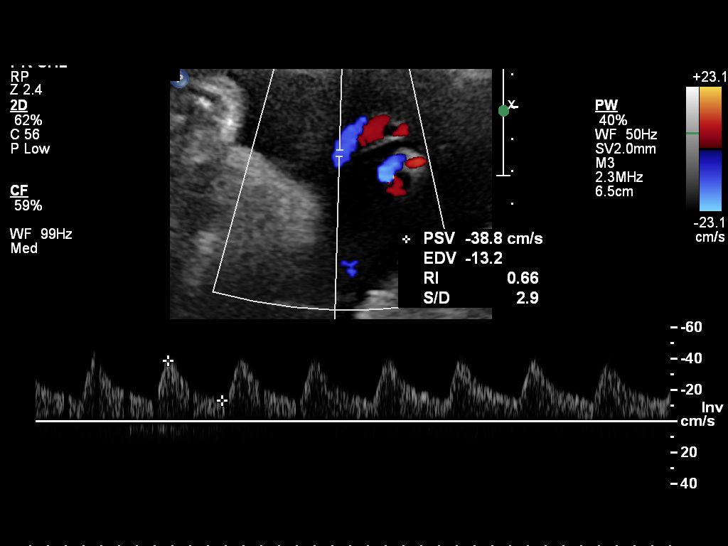
[im 11/12]
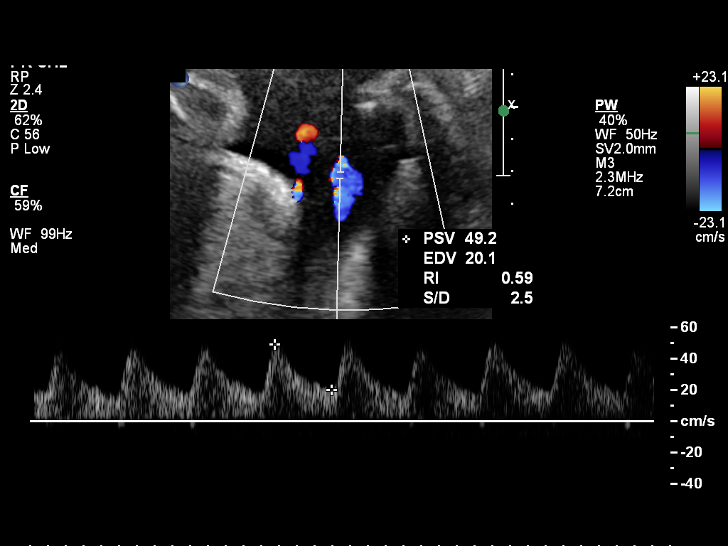
[im 12/12]
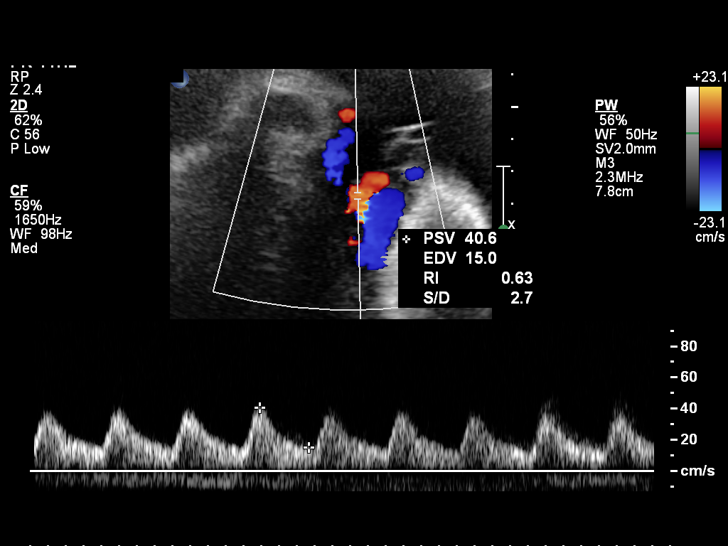

[12 of 12 positions shown; findings below may reference images not displayed]

OBSTETRICS REPORT
(Signed Final 01/30/2015 [DATE])

Service(s) Provided

Indications

Decreased fetal movement
Non-reactive NST
33 weeks gestation of pregnancy
Fetal Evaluation

Num Of Fetuses:    1
Fetal Heart Rate:  149                          bpm
Cardiac Activity:  Observed
Presentation:      Cephalic

Amniotic Fluid
AFI FV:      Subjectively within normal limits
AFI Sum:     15.45   cm       55  %Tile      Larg Pckt:   6.63  cm
RUQ:   4.39    cm   RLQ:    4.43   cm    LUQ:   6.63    cm
Biophysical Evaluation

Amniotic F.V:   Within normal limits       F. Tone:         Observed
F. Movement:    Observed                   Score:           [DATE]
F. Breathing:   Not Observed
Gestational Age

LMP:           33w 5d        Date:  06/07/14                 EDD:   03/14/15
Best:          33w 5d     Det. By:  LMP  (06/07/14)          EDD:   03/14/15
Anatomy

Stomach:          Appears normal, left   Bladder:          Appears normal
sided
Doppler - Fetal Vessels

Umbilical Artery
S/D:   2.7            57  %tile
Umbilical Artery
Absent DFV:    No     Reverse DFV:    No
Cervix Uterus Adnexa

Cervix:       Not visualized (advanced GA >63wks)
Impression

SIUP at 33+5 weeks
Normal amniotic fluid volume
BPP [DATE]  (-2 for BM)
Recommendations

Coorelate with tracing and clinical picture
If remains [DATE], repeat within 24 hours

## 2017-01-17 ENCOUNTER — Telehealth: Payer: Self-pay

## 2017-01-17 NOTE — Telephone Encounter (Signed)
Patient calling and is originally a [facility] patient. She had invitro with her last baby and had lots of complications and was wondering what Dr. Denyce RobertStinson's opinion would be on further pregnancies for her. I offered a consult but she doesn't haven insurance at this time. Patient also expressed that she would want him to deliver due to her complications last time- informed patient that there was no way to grantee that he could be delivering physician. Patient states understanding and still would like his opinion on if they should persue in vitro. Made patient aware that he is out of the office for two weeks but will route this message to him for his review when he returns. Told patient she may have to come in for consult to discuss all her concerns. Armandina StammerJennifer Alexsia Klindt RNBSN

## 2017-02-23 ENCOUNTER — Emergency Department (HOSPITAL_COMMUNITY): Payer: Self-pay

## 2017-02-23 ENCOUNTER — Emergency Department (HOSPITAL_COMMUNITY)
Admission: EM | Admit: 2017-02-23 | Discharge: 2017-02-24 | Disposition: A | Discharge status: Home / Self Care | Payer: Self-pay | Attending: Emergency Medicine | Admitting: Emergency Medicine

## 2017-02-23 ENCOUNTER — Encounter (HOSPITAL_COMMUNITY): Payer: Self-pay | Admitting: Emergency Medicine

## 2017-02-23 DIAGNOSIS — R109 Unspecified abdominal pain: Secondary | ICD-10-CM

## 2017-02-23 DIAGNOSIS — Z791 Long term (current) use of non-steroidal anti-inflammatories (NSAID): Secondary | ICD-10-CM | POA: Insufficient documentation

## 2017-02-23 DIAGNOSIS — K5289 Other specified noninfective gastroenteritis and colitis: Secondary | ICD-10-CM | POA: Insufficient documentation

## 2017-02-23 DIAGNOSIS — K625 Hemorrhage of anus and rectum: Secondary | ICD-10-CM | POA: Insufficient documentation

## 2017-02-23 DIAGNOSIS — Z79899 Other long term (current) drug therapy: Secondary | ICD-10-CM | POA: Insufficient documentation

## 2017-02-23 DIAGNOSIS — K529 Noninfective gastroenteritis and colitis, unspecified: Secondary | ICD-10-CM

## 2017-02-23 HISTORY — DX: Essential (primary) hypertension: I10

## 2017-02-23 LAB — URINALYSIS, ROUTINE W REFLEX MICROSCOPIC
Bilirubin Urine: NEGATIVE
Glucose, UA: NEGATIVE mg/dL
Ketones, ur: NEGATIVE mg/dL
Leukocytes, UA: NEGATIVE
Nitrite: NEGATIVE
Protein, ur: NEGATIVE mg/dL
Specific Gravity, Urine: 1.004 — ABNORMAL LOW (ref 1.005–1.030)
pH: 7 (ref 5.0–8.0)

## 2017-02-23 LAB — COMPREHENSIVE METABOLIC PANEL
ALT: 10 U/L — ABNORMAL LOW (ref 14–54)
AST: 22 U/L (ref 15–41)
Albumin: 4 g/dL (ref 3.5–5.0)
Alkaline Phosphatase: 81 U/L (ref 38–126)
Anion gap: 7 (ref 5–15)
BUN: 5 mg/dL — ABNORMAL LOW (ref 6–20)
CO2: 25 mmol/L (ref 22–32)
Calcium: 9.3 mg/dL (ref 8.9–10.3)
Chloride: 106 mmol/L (ref 101–111)
Creatinine, Ser: 0.93 mg/dL (ref 0.44–1.00)
GFR calc Af Amer: >60 mL/min (ref >60)
GFR calc non Af Amer: >60 mL/min (ref >60)
Glucose, Bld: 94 mg/dL (ref 65–99)
Potassium: 4.1 mmol/L (ref 3.5–5.1)
Sodium: 138 mmol/L (ref 135–145)
Total Bilirubin: 0.4 mg/dL (ref 0.3–1.2)
Total Protein: 6.8 g/dL (ref 6.5–8.1)

## 2017-02-23 LAB — CBC
HCT: 38.7 % (ref 36.0–46.0)
Hemoglobin: 12.6 g/dL (ref 12.0–15.0)
MCH: 29.2 pg (ref 26.0–34.0)
MCHC: 32.6 g/dL (ref 30.0–36.0)
MCV: 89.6 fL (ref 78.0–100.0)
Platelets: 295 10*3/uL (ref 150–400)
RBC: 4.32 MIL/uL (ref 3.87–5.11)
RDW: 13.6 % (ref 11.5–15.5)
WBC: 8.1 10*3/uL (ref 4.0–10.5)

## 2017-02-23 LAB — TYPE AND SCREEN
ABO/RH(D): O POS
Antibody Screen: NEGATIVE

## 2017-02-23 LAB — POC URINE PREG, ED: Preg Test, Ur: NEGATIVE

## 2017-02-23 LAB — ABO/RH: ABO/RH(D): O POS

## 2017-02-23 MED ORDER — MORPHINE SULFATE (PF) 4 MG/ML IV SOLN
4.0000 mg | Freq: Once | INTRAVENOUS | Status: AC
  Administered 2017-02-23: 4 mg via INTRAVENOUS
  Filled 2017-02-23: qty 1

## 2017-02-23 MED ORDER — IOPAMIDOL (ISOVUE-300) INJECTION 61%
INTRAVENOUS | Status: AC
  Administered 2017-02-23: 80 mL
  Filled 2017-02-23: qty 100

## 2017-02-23 NOTE — ED Triage Notes (Signed)
Pt reports abd cramping, flank pain, bloody diarrhea that turned into dark stools since yesterday. Pt has hx of IBS. Went to PCP and was told to come here for eval

## 2017-02-23 NOTE — ED Notes (Signed)
Patient transported to CT scan . 

## 2017-02-23 NOTE — ED Provider Notes (Signed)
[facility]-EMERGENCY DEPT Provider Note   CSN: [id number] Arrival date & time: 02/23/17  1956  By signing my name below, I, Rosario AdieWilliam Andrew Hiatt, attest that this documentation has been prepared under the direction and in the presence of Raeford RazorKohut, Palmer Fahrner, MD. Electronically Signed: Rosario AdieWilliam Andrew Hiatt, ED Scribe. 02/23/17. 10:45 PM.  History   Chief Complaint Chief Complaint  Patient presents with  . Abdominal Pain   The history is provided by the patient. No language interpreter was used.    HPI Comments: Chelsea Ferguson is a 21 y.o. female with a h/o PCOS, IBS, and obesity, who presents to the Emergency Department complaining of constant, unchanged mid-lower abdominal cramping beginning yesterday. Pt has a h/o recurrent abdominal pain at baseline; she reports that over the past two days she this has become a constant pain, and additionally yesterday and today she passed both bright-red blood and melenotic stools. She has had five bowel movements since yesterday. She also reports some associated chills, headache, and light-headedness. No measured fevers at home. Pt reports that she was being seen by her PCP's office today prior to coming into the ED for this issue; at that time she was referred into the ED for further management. She attempted taking Ibuprofen at home w/o improvement of her symptoms. No frequent NSAID usage otherwise. Pt is not currently on anticoagulant or antiplatelet therapy. No h/o peptic ulcers. No recent antibiotic usage or travel. She denies dizziness, chest pain, shortness of breath, or any other associated symptoms.   Past Medical History:  Diagnosis Date  . Headache   . IBS (irritable bowel syndrome)   . Infertility, female   . PCOS (polycystic ovarian syndrome)    Patient Active Problem List   Diagnosis Date Noted  . Severe preeclampsia 02/09/2015  . Gestational hypertension w/o significant proteinuria in 3rd trimester   . Threatened preterm labor, antepartum  01/12/2015  . Supervision of normal first pregnancy 07/28/2014  . Obesity (BMI 30.0-34.9) 07/28/2014  . Obesity affecting pregnancy, antepartum 07/28/2014  . Infertility associated with anovulation 07/28/2014   Past Surgical History:  Procedure Laterality Date  . NO PAST SURGERIES     OB History    Gravida Para Term Preterm AB Living   1 1   1   1    SAB TAB Ectopic Multiple Live Births         0 1     Home Medications    Prior to Admission medications   Medication Sig Start Date End Date Taking? Authorizing Provider  etonogestrel-ethinyl estradiol (NUVARING) 0.12-0.015 MG/24HR vaginal ring Insert vaginally and leave in place for 3 consecutive weeks, then remove for 1 week. 03/25/15   Clemmons, Lori A, CNM  fluconazole (DIFLUCAN) 150 MG tablet Take 1 tablet (150 mg total) by mouth once. 06/12/15   Reva BoresPratt, Tanya S, MD  ibuprofen (ADVIL,MOTRIN) 600 MG tablet Take 1 tablet (600 mg total) by mouth every 6 (six) hours as needed. Patient taking differently: Take 600 mg by mouth every 6 (six) hours as needed for fever or moderate pain.  02/13/15   Erasmo DownerBacigalupo, Angela M, MD  levofloxacin (LEVAQUIN) 750 MG tablet Take 1 tablet (750 mg total) by mouth daily. 05/19/15   Anyanwu, Jethro BastosUgonna A, MD  nitrofurantoin, macrocrystal-monohydrate, (MACROBID) 100 MG capsule Take 1 capsule (100 mg total) by mouth 2 (two) times daily. 04/02/15   Allie Bossierove, Myra C, MD  phenazopyridine (PYRIDIUM) 200 MG tablet Take 1 tablet (200 mg total) by mouth 3 (three) times daily as needed  for pain (urethral spasm). 05/19/15   Anyanwu, Jethro Bastos, MD  sulfamethoxazole-trimethoprim (BACTRIM DS,SEPTRA DS) 800-160 MG per tablet Take 1 tablet by mouth 2 (two) times daily. 04/16/15   Allie Bossier, MD  sulfamethoxazole-trimethoprim (BACTRIM DS,SEPTRA DS) 800-160 MG per tablet Take 1 tablet by mouth 2 (two) times daily. 04/27/15   Allie Bossier, MD   Family History Family History  Problem Relation Age of Onset  . Diabetes Maternal Grandmother   .  Hypertension Maternal Grandmother   . Hypertension Paternal Grandmother    Social History Social History  Substance Use Topics  . Smoking status: Never Smoker  . Smokeless tobacco: Never Used  . Alcohol use No   Allergies   Patient has no known allergies.  Review of Systems Review of Systems  Constitutional: Positive for chills. Negative for fever.  Respiratory: Negative for shortness of breath.   Cardiovascular: Negative for chest pain.  Gastrointestinal: Positive for abdominal pain and blood in stool. Negative for nausea and vomiting.  Neurological: Positive for light-headedness. Negative for dizziness.  All other systems reviewed and are negative.  Physical Exam Updated Vital Signs BP 126/76 (BP Location: Right Arm)   Pulse 68   Temp 98.9 F (37.2 C) (Oral)   Resp 16   Ht 5\' 1"  (1.549 m)   Wt 162 lb (73.5 kg)   SpO2 100%   BMI 30.61 kg/m   Physical Exam  Constitutional: She appears well-developed and well-nourished.  HENT:  Head: Normocephalic.  Right Ear: External ear normal.  Left Ear: External ear normal.  Nose: Nose normal.  Mouth/Throat: Oropharynx is clear and moist.  Eyes: Conjunctivae are normal. Right eye exhibits no discharge. Left eye exhibits no discharge.  Neck: Normal range of motion.  Cardiovascular: Normal rate, regular rhythm and normal heart sounds.   No murmur heard. Pulmonary/Chest: Effort normal and breath sounds normal. No respiratory distress. She has no wheezes. She has no rales.  Abdominal: Soft. She exhibits no distension. There is tenderness. There is no rebound and no guarding.  Mild lower abdominal tenderness.   Musculoskeletal: Normal range of motion. She exhibits no edema or tenderness.  Neurological: She is alert. No cranial nerve deficit. Coordination normal.  Skin: Skin is warm and dry. No rash noted. No erythema. No pallor.  Psychiatric: She has a normal mood and affect. Her behavior is normal.  Nursing note and vitals  reviewed.  ED Treatments / Results  DIAGNOSTIC STUDIES: Oxygen Saturation is 100% on RA, normal by my interpretation.   COORDINATION OF CARE: 10:45 PM-Discussed next steps with pt. Pt verbalized understanding and is agreeable with the plan.   Labs (all labs ordered are listed, but only abnormal results are displayed) Labs Reviewed  COMPREHENSIVE METABOLIC PANEL - Abnormal; Notable for the following:       Result Value   BUN 5 (*)    ALT 10 (*)    All other components within normal limits  URINALYSIS, ROUTINE W REFLEX MICROSCOPIC - Abnormal; Notable for the following:    Color, Urine STRAW (*)    Specific Gravity, Urine 1.004 (*)    Hgb urine dipstick MODERATE (*)    Bacteria, UA RARE (*)    Squamous Epithelial / LPF 0-5 (*)    All other components within normal limits  CBC  POC URINE PREG, ED  POC OCCULT BLOOD, ED  TYPE AND SCREEN  ABO/RH   EKG  EKG Interpretation None      Radiology Ct Abdomen Pelvis W  Contrast  Result Date: 02/24/2017 CLINICAL DATA:  21 year old female with rectal bleeding for several days. Lower back pain. EXAM: CT ABDOMEN AND PELVIS WITH CONTRAST TECHNIQUE: Multidetector CT imaging of the abdomen and pelvis was performed using the standard protocol following bolus administration of intravenous contrast. CONTRAST:  <See Chart> ISOVUE-300 IOPAMIDOL (ISOVUE-300) INJECTION 61% COMPARISON:  Right upper quadrant ultrasound dated 05/25/2016 FINDINGS: Lower chest: The visualized lung bases are clear. No intra-abdominal free air. Trace free fluid may be present within the pelvis Hepatobiliary: Probable mild fatty infiltration of the liver. The liver is otherwise unremarkable. No intrahepatic biliary ductal dilatation. The gallbladder is unremarkable. Pancreas: Unremarkable. No pancreatic ductal dilatation or surrounding inflammatory changes. Spleen: Normal in size without focal abnormality. Adrenals/Urinary Tract: Adrenal glands are unremarkable. Kidneys are  normal, without renal calculi, focal lesion, or hydronephrosis. Bladder is unremarkable. Stomach/Bowel: There is circumferential thickening and mild inflammatory changes of the descending colon most consistent with colitis. There is no evidence of bowel obstruction. Normal appendix. Vascular/Lymphatic: No significant vascular findings are present. No enlarged abdominal or pelvic lymph nodes. Reproductive: Uterus and bilateral adnexa are unremarkable. Other: None Musculoskeletal: No acute or significant osseous findings. IMPRESSION: Segmental colitis of the descending colon. Correlation with clinical exam and stool cultures recommended. No abscess or perforation. No bowel obstruction. Normal appendix. Electronically Signed   By: Elgie Collard M.D.   On: 02/24/2017 00:23    Procedures Procedures  Medications Ordered in ED Medications - No data to display  Initial Impression / Assessment and Plan / ED Course  I have reviewed the triage vital signs and the nursing notes.  Pertinent labs & imaging results that were available during my care of the patient were reviewed by me and considered in my medical decision making (see chart for details).  Colitis. Will tx for possible infectious etiology. It has been determined that no acute conditions requiring further emergency intervention are present at this time. The patient has been advised of the diagnosis and plan. I reviewed any labs and imaging including any potential incidental findings. We have discussed signs and symptoms that warrant return to the ED and they are listed in the discharge instructions.   Final Clinical Impressions(s) / ED Diagnoses   Final diagnoses:  Abdominal pain, unspecified abdominal location  Rectal bleeding    New Prescriptions New Prescriptions   No medications on file   I personally preformed the services scribed in my presence. The recorded information has been reviewed is accurate. Raeford Razor, MD.       Raeford Razor, MD 03/04/17 [phone]

## 2017-02-24 ENCOUNTER — Telehealth: Payer: Self-pay | Admitting: Surgery

## 2017-02-24 MED ORDER — SODIUM CHLORIDE 0.9 % IV BOLUS (SEPSIS)
1000.0000 mL | Freq: Once | INTRAVENOUS | Status: DC
  Administered 2017-02-24: 1000 mL via INTRAVENOUS

## 2017-02-24 MED ORDER — DICYCLOMINE HCL 20 MG PO TABS: 20.0000 mg | ORAL_TABLET | Freq: Four times a day (QID) | ORAL | 0 refills | Status: DC | PRN

## 2017-02-24 MED ORDER — METRONIDAZOLE IN NACL 5-0.79 MG/ML-% IV SOLN
500.0000 mg | Freq: Once | INTRAVENOUS | Status: DC
  Filled 2017-02-24: qty 100

## 2017-02-24 MED ORDER — METRONIDAZOLE 500 MG PO TABS
500.0000 mg | ORAL_TABLET | Freq: Once | ORAL | Status: AC
  Administered 2017-02-24: 500 mg via ORAL
  Filled 2017-02-24: qty 1

## 2017-02-24 MED ORDER — CIPROFLOXACIN HCL 500 MG PO TABS: 500.0000 mg | ORAL_TABLET | Freq: Two times a day (BID) | ORAL | 0 refills | Status: DC

## 2017-02-24 MED ORDER — CIPROFLOXACIN IN D5W 400 MG/200ML IV SOLN
400.0000 mg | Freq: Once | INTRAVENOUS | Status: DC
Start: 2017-02-24 — End: 2017-02-24
  Filled 2017-02-24: qty 200

## 2017-02-24 MED ORDER — CIPROFLOXACIN HCL 500 MG PO TABS
500.0000 mg | ORAL_TABLET | Freq: Once | ORAL | Status: AC
  Administered 2017-02-24: 500 mg via ORAL
  Filled 2017-02-24: qty 1

## 2017-02-24 MED ORDER — METRONIDAZOLE 500 MG PO TABS: 500.0000 mg | ORAL_TABLET | Freq: Three times a day (TID) | ORAL | 0 refills | Status: DC

## 2017-02-24 NOTE — Telephone Encounter (Signed)
ED CM received call from patient and [facility] pharmacy regarding prescription information from today's ED visit. CM read aloud clarifying prescription information from [facility].  No further questions or concerns verbalized.

## 2018-01-08 ENCOUNTER — Encounter: Payer: Self-pay | Admitting: Family Medicine

## 2018-02-02 ENCOUNTER — Institutional Professional Consult (permissible substitution): Payer: Self-pay | Admitting: Family Medicine

## 2018-05-30 ENCOUNTER — Ambulatory Visit: Payer: Self-pay | Admitting: Student

## 2018-05-30 DIAGNOSIS — Z23 Encounter for immunization: Secondary | ICD-10-CM

## 2018-05-30 NOTE — Progress Notes (Signed)
f °

## 2018-09-10 DIAGNOSIS — G44201 Tension-type headache, unspecified, intractable: Secondary | ICD-10-CM | POA: Diagnosis not present

## 2018-09-10 DIAGNOSIS — M9901 Segmental and somatic dysfunction of cervical region: Secondary | ICD-10-CM | POA: Diagnosis not present

## 2018-09-10 DIAGNOSIS — M9902 Segmental and somatic dysfunction of thoracic region: Secondary | ICD-10-CM | POA: Diagnosis not present

## 2018-09-13 DIAGNOSIS — M9901 Segmental and somatic dysfunction of cervical region: Secondary | ICD-10-CM | POA: Diagnosis not present

## 2018-09-13 DIAGNOSIS — G44201 Tension-type headache, unspecified, intractable: Secondary | ICD-10-CM | POA: Diagnosis not present

## 2018-09-13 DIAGNOSIS — M9902 Segmental and somatic dysfunction of thoracic region: Secondary | ICD-10-CM | POA: Diagnosis not present

## 2018-09-20 DIAGNOSIS — M9901 Segmental and somatic dysfunction of cervical region: Secondary | ICD-10-CM | POA: Diagnosis not present

## 2018-09-20 DIAGNOSIS — M9902 Segmental and somatic dysfunction of thoracic region: Secondary | ICD-10-CM | POA: Diagnosis not present

## 2018-09-20 DIAGNOSIS — G44201 Tension-type headache, unspecified, intractable: Secondary | ICD-10-CM | POA: Diagnosis not present

## 2018-10-31 DIAGNOSIS — J029 Acute pharyngitis, unspecified: Secondary | ICD-10-CM | POA: Diagnosis not present

## 2018-11-01 DIAGNOSIS — J029 Acute pharyngitis, unspecified: Secondary | ICD-10-CM | POA: Diagnosis not present

## 2018-11-14 DIAGNOSIS — F419 Anxiety disorder, unspecified: Secondary | ICD-10-CM | POA: Diagnosis not present

## 2018-11-14 DIAGNOSIS — J0110 Acute frontal sinusitis, unspecified: Secondary | ICD-10-CM | POA: Diagnosis not present

## 2018-11-14 DIAGNOSIS — F322 Major depressive disorder, single episode, severe without psychotic features: Secondary | ICD-10-CM | POA: Diagnosis not present

## 2018-11-20 DIAGNOSIS — M9902 Segmental and somatic dysfunction of thoracic region: Secondary | ICD-10-CM | POA: Diagnosis not present

## 2018-11-20 DIAGNOSIS — G44201 Tension-type headache, unspecified, intractable: Secondary | ICD-10-CM | POA: Diagnosis not present

## 2018-11-20 DIAGNOSIS — M9901 Segmental and somatic dysfunction of cervical region: Secondary | ICD-10-CM | POA: Diagnosis not present

## 2018-11-27 DIAGNOSIS — K602 Anal fissure, unspecified: Secondary | ICD-10-CM | POA: Diagnosis not present

## 2018-11-27 DIAGNOSIS — K641 Second degree hemorrhoids: Secondary | ICD-10-CM | POA: Diagnosis not present

## 2018-12-18 DIAGNOSIS — F419 Anxiety disorder, unspecified: Secondary | ICD-10-CM | POA: Diagnosis not present

## 2018-12-18 DIAGNOSIS — M9902 Segmental and somatic dysfunction of thoracic region: Secondary | ICD-10-CM | POA: Diagnosis not present

## 2018-12-18 DIAGNOSIS — G44201 Tension-type headache, unspecified, intractable: Secondary | ICD-10-CM | POA: Diagnosis not present

## 2018-12-18 DIAGNOSIS — M9901 Segmental and somatic dysfunction of cervical region: Secondary | ICD-10-CM | POA: Diagnosis not present

## 2018-12-18 DIAGNOSIS — F322 Major depressive disorder, single episode, severe without psychotic features: Secondary | ICD-10-CM | POA: Diagnosis not present

## 2018-12-19 DIAGNOSIS — F411 Generalized anxiety disorder: Secondary | ICD-10-CM | POA: Diagnosis not present

## 2018-12-19 DIAGNOSIS — F322 Major depressive disorder, single episode, severe without psychotic features: Secondary | ICD-10-CM | POA: Diagnosis not present

## 2018-12-24 DIAGNOSIS — R05 Cough: Secondary | ICD-10-CM | POA: Diagnosis not present

## 2018-12-24 DIAGNOSIS — L299 Pruritus, unspecified: Secondary | ICD-10-CM | POA: Diagnosis not present

## 2018-12-24 DIAGNOSIS — M9902 Segmental and somatic dysfunction of thoracic region: Secondary | ICD-10-CM | POA: Diagnosis not present

## 2018-12-24 DIAGNOSIS — X58XXXA Exposure to other specified factors, initial encounter: Secondary | ICD-10-CM | POA: Diagnosis not present

## 2018-12-24 DIAGNOSIS — M9901 Segmental and somatic dysfunction of cervical region: Secondary | ICD-10-CM | POA: Diagnosis not present

## 2018-12-24 DIAGNOSIS — R21 Rash and other nonspecific skin eruption: Secondary | ICD-10-CM | POA: Diagnosis not present

## 2018-12-24 DIAGNOSIS — G44201 Tension-type headache, unspecified, intractable: Secondary | ICD-10-CM | POA: Diagnosis not present

## 2018-12-24 DIAGNOSIS — L237 Allergic contact dermatitis due to plants, except food: Secondary | ICD-10-CM | POA: Diagnosis not present

## 2018-12-24 DIAGNOSIS — F419 Anxiety disorder, unspecified: Secondary | ICD-10-CM | POA: Diagnosis not present

## 2019-01-01 DIAGNOSIS — M9902 Segmental and somatic dysfunction of thoracic region: Secondary | ICD-10-CM | POA: Diagnosis not present

## 2019-01-01 DIAGNOSIS — G44201 Tension-type headache, unspecified, intractable: Secondary | ICD-10-CM | POA: Diagnosis not present

## 2019-01-01 DIAGNOSIS — M9901 Segmental and somatic dysfunction of cervical region: Secondary | ICD-10-CM | POA: Diagnosis not present

## 2019-01-07 DIAGNOSIS — M9901 Segmental and somatic dysfunction of cervical region: Secondary | ICD-10-CM | POA: Diagnosis not present

## 2019-01-07 DIAGNOSIS — M9902 Segmental and somatic dysfunction of thoracic region: Secondary | ICD-10-CM | POA: Diagnosis not present

## 2019-01-07 DIAGNOSIS — G44201 Tension-type headache, unspecified, intractable: Secondary | ICD-10-CM | POA: Diagnosis not present

## 2019-01-14 DIAGNOSIS — M9901 Segmental and somatic dysfunction of cervical region: Secondary | ICD-10-CM | POA: Diagnosis not present

## 2019-01-14 DIAGNOSIS — G44201 Tension-type headache, unspecified, intractable: Secondary | ICD-10-CM | POA: Diagnosis not present

## 2019-01-14 DIAGNOSIS — M9902 Segmental and somatic dysfunction of thoracic region: Secondary | ICD-10-CM | POA: Diagnosis not present

## 2019-01-17 DIAGNOSIS — Z124 Encounter for screening for malignant neoplasm of cervix: Secondary | ICD-10-CM | POA: Diagnosis not present

## 2019-01-17 DIAGNOSIS — E282 Polycystic ovarian syndrome: Secondary | ICD-10-CM | POA: Diagnosis not present

## 2019-01-17 DIAGNOSIS — N907 Vulvar cyst: Secondary | ICD-10-CM | POA: Diagnosis not present

## 2019-01-17 DIAGNOSIS — R102 Pelvic and perineal pain: Secondary | ICD-10-CM | POA: Diagnosis not present

## 2019-01-27 DIAGNOSIS — F411 Generalized anxiety disorder: Secondary | ICD-10-CM | POA: Diagnosis not present

## 2019-01-27 DIAGNOSIS — F329 Major depressive disorder, single episode, unspecified: Secondary | ICD-10-CM | POA: Diagnosis not present

## 2019-01-31 DIAGNOSIS — G44201 Tension-type headache, unspecified, intractable: Secondary | ICD-10-CM | POA: Diagnosis not present

## 2019-01-31 DIAGNOSIS — M9902 Segmental and somatic dysfunction of thoracic region: Secondary | ICD-10-CM | POA: Diagnosis not present

## 2019-01-31 DIAGNOSIS — Z8349 Family history of other endocrine, nutritional and metabolic diseases: Secondary | ICD-10-CM | POA: Diagnosis not present

## 2019-01-31 DIAGNOSIS — M9901 Segmental and somatic dysfunction of cervical region: Secondary | ICD-10-CM | POA: Diagnosis not present

## 2019-01-31 DIAGNOSIS — F419 Anxiety disorder, unspecified: Secondary | ICD-10-CM | POA: Diagnosis not present

## 2019-01-31 DIAGNOSIS — K219 Gastro-esophageal reflux disease without esophagitis: Secondary | ICD-10-CM | POA: Diagnosis not present

## 2019-02-04 DIAGNOSIS — R102 Pelvic and perineal pain: Secondary | ICD-10-CM | POA: Diagnosis not present

## 2019-02-07 DIAGNOSIS — G44201 Tension-type headache, unspecified, intractable: Secondary | ICD-10-CM | POA: Diagnosis not present

## 2019-02-07 DIAGNOSIS — M9901 Segmental and somatic dysfunction of cervical region: Secondary | ICD-10-CM | POA: Diagnosis not present

## 2019-02-07 DIAGNOSIS — M9902 Segmental and somatic dysfunction of thoracic region: Secondary | ICD-10-CM | POA: Diagnosis not present

## 2019-02-17 DIAGNOSIS — F329 Major depressive disorder, single episode, unspecified: Secondary | ICD-10-CM | POA: Diagnosis not present

## 2019-02-17 DIAGNOSIS — F411 Generalized anxiety disorder: Secondary | ICD-10-CM | POA: Diagnosis not present

## 2019-02-19 DIAGNOSIS — R002 Palpitations: Secondary | ICD-10-CM | POA: Diagnosis not present

## 2019-02-19 DIAGNOSIS — R7989 Other specified abnormal findings of blood chemistry: Secondary | ICD-10-CM | POA: Diagnosis not present

## 2019-02-19 DIAGNOSIS — R102 Pelvic and perineal pain: Secondary | ICD-10-CM | POA: Diagnosis not present

## 2019-03-07 ENCOUNTER — Encounter (HOSPITAL_COMMUNITY): Payer: Self-pay | Admitting: Emergency Medicine

## 2019-03-07 ENCOUNTER — Emergency Department (HOSPITAL_COMMUNITY)
Admission: EM | Admit: 2019-03-07 | Discharge: 2019-03-07 | Disposition: A | Discharge status: Home / Self Care | Payer: BC Managed Care – PPO | Attending: Emergency Medicine | Admitting: Emergency Medicine

## 2019-03-07 ENCOUNTER — Other Ambulatory Visit: Payer: Self-pay

## 2019-03-07 ENCOUNTER — Emergency Department (HOSPITAL_COMMUNITY): Payer: BC Managed Care – PPO

## 2019-03-07 DIAGNOSIS — R197 Diarrhea, unspecified: Secondary | ICD-10-CM | POA: Diagnosis not present

## 2019-03-07 DIAGNOSIS — R109 Unspecified abdominal pain: Secondary | ICD-10-CM | POA: Diagnosis not present

## 2019-03-07 DIAGNOSIS — R112 Nausea with vomiting, unspecified: Secondary | ICD-10-CM | POA: Insufficient documentation

## 2019-03-07 LAB — LIPASE, BLOOD: Lipase: 24 U/L (ref 11–51)

## 2019-03-07 LAB — COMPREHENSIVE METABOLIC PANEL
ALT: 16 U/L (ref 0–44)
AST: 22 U/L (ref 15–41)
Albumin: 4.3 g/dL (ref 3.5–5.0)
Alkaline Phosphatase: 74 U/L (ref 38–126)
Anion gap: 10 (ref 5–15)
BUN: 6 mg/dL (ref 6–20)
CO2: 24 mmol/L (ref 22–32)
Calcium: 9.4 mg/dL (ref 8.9–10.3)
Chloride: 103 mmol/L (ref 98–111)
Creatinine, Ser: 0.68 mg/dL (ref 0.44–1.00)
GFR calc Af Amer: >60 mL/min (ref >60)
GFR calc non Af Amer: >60 mL/min (ref >60)
Glucose, Bld: 121 mg/dL — ABNORMAL HIGH (ref 70–99)
Potassium: 3.9 mmol/L (ref 3.5–5.1)
Sodium: 137 mmol/L (ref 135–145)
Total Bilirubin: 0.6 mg/dL (ref 0.3–1.2)
Total Protein: 7.3 g/dL (ref 6.5–8.1)

## 2019-03-07 LAB — CBC WITH DIFFERENTIAL/PLATELET
Abs Immature Granulocytes: 0.03 10*3/uL (ref 0.00–0.07)
Basophils Absolute: 0 10*3/uL (ref 0.0–0.1)
Basophils Relative: 0 %
Eosinophils Absolute: 0.1 10*3/uL (ref 0.0–0.5)
Eosinophils Relative: 1 %
HCT: 42.7 % (ref 36.0–46.0)
Hemoglobin: 13.7 g/dL (ref 12.0–15.0)
Immature Granulocytes: 0 %
Lymphocytes Relative: 22 %
Lymphs Abs: 1.8 10*3/uL (ref 0.7–4.0)
MCH: 27.8 pg (ref 26.0–34.0)
MCHC: 32.1 g/dL (ref 30.0–36.0)
MCV: 86.8 fL (ref 80.0–100.0)
Monocytes Absolute: 0.4 10*3/uL (ref 0.1–1.0)
Monocytes Relative: 4 %
Neutro Abs: 6 10*3/uL (ref 1.7–7.7)
Neutrophils Relative %: 73 %
Platelets: 336 10*3/uL (ref 150–400)
RBC: 4.92 MIL/uL (ref 3.87–5.11)
RDW: 13.6 % (ref 11.5–15.5)
WBC: 8.3 10*3/uL (ref 4.0–10.5)
nRBC: 0 % (ref 0.0–0.2)

## 2019-03-07 LAB — URINALYSIS, ROUTINE W REFLEX MICROSCOPIC
Bacteria, UA: NONE SEEN
Bilirubin Urine: NEGATIVE
Glucose, UA: NEGATIVE mg/dL
Hgb urine dipstick: NEGATIVE
Ketones, ur: NEGATIVE mg/dL
Leukocytes,Ua: NEGATIVE
Nitrite: NEGATIVE
Protein, ur: 30 mg/dL — AB
Specific Gravity, Urine: 1.039 — ABNORMAL HIGH (ref 1.005–1.030)
pH: 6 (ref 5.0–8.0)

## 2019-03-07 LAB — PREGNANCY, URINE: Preg Test, Ur: NEGATIVE

## 2019-03-07 MED ORDER — SODIUM CHLORIDE 0.9 % IV BOLUS
1000.0000 mL | Freq: Once | INTRAVENOUS | Status: AC
  Administered 2019-03-07: 06:00:00 1000 mL via INTRAVENOUS

## 2019-03-07 MED ORDER — ONDANSETRON 4 MG PO TBDP: 4.0000 mg | ORAL_TABLET | Freq: Three times a day (TID) | ORAL | 0 refills | Status: DC | PRN

## 2019-03-07 MED ORDER — ONDANSETRON HCL 4 MG/2ML IJ SOLN
4.0000 mg | Freq: Once | INTRAMUSCULAR | Status: AC
  Administered 2019-03-07: 4 mg via INTRAVENOUS
  Filled 2019-03-07: qty 2

## 2019-03-07 MED ORDER — SODIUM CHLORIDE 0.9 % IV BOLUS
1000.0000 mL | Freq: Once | INTRAVENOUS | Status: AC
  Administered 2019-03-07: 1000 mL via INTRAVENOUS

## 2019-03-07 MED ORDER — IOHEXOL 300 MG/ML  SOLN
100.0000 mL | Freq: Once | INTRAMUSCULAR | Status: AC | PRN
  Administered 2019-03-07: 100 mL via INTRAVENOUS

## 2019-03-07 NOTE — ED Triage Notes (Signed)
Pt c/o emesis x 6-7 episodes in past 24 hours, diarrhea x 2 weeks and sweats x 3 weeks, denies abd pain

## 2019-03-07 NOTE — ED Notes (Signed)
Pt reports having frequent UTIs for several years and recently was on Antibiotics for her more frequent UTI a few weeks ago. Pt reports inability to eat or drink do to nausea and vomiting. This has been an on-going problem she has been dealing with.

## 2019-03-07 NOTE — ED Notes (Signed)
Pt tolerates PO Fluids without complication.

## 2019-03-07 NOTE — Discharge Instructions (Signed)
Take the nausea medication as prescribed.  Keep yourself hydrated.  Follow-up with your doctor as well as the GI doctor for further evaluation of your stomach issues.  Return to the ED with new or worsening symptoms.

## 2019-03-07 NOTE — ED Provider Notes (Signed)
[facility] EMERGENCY DEPARTMENT Provider Note   CSN: [id number] Arrival date & time: 03/07/19  0137     History   Chief Complaint Chief Complaint  Patient presents with  . Emesis    HPI Tera HelperRebekah J Reinard is a 23 y.o. female.      Patient with history of IBS presenting with nausea, vomiting, abdominal pain and diarrhea with chills and sweating.  Symptoms have been ongoing for the past 2 or 3 days.  Patient had intermittent vomiting for the past 2 weeks not every day but at least once or twice every few days.  Over the past 24 hours she is about 6 or 7 episodes of nonbloody nonbilious emesis associated with diarrhea about 6 or 7 times.  Diarrhea is nonbloody.  States she had cold sweats and chills but no fever.  Denies abdominal pain.  States she normally has some diarrhea for IBS but this is worse.  Denies any chest pain or shortness of breath.  Denies any pain with urination or blood in the urine.  No vaginal bleeding or discharge.  States she does have a history of ovarian cysts and feels "the cysts" but denies abdominal pain.  She wonders whether symptoms may be due to increasing doses of her Effexor or BuSpar.  States she had similar presentations in the past with UTIs.  The history is provided by the patient and a relative.  Emesis Associated symptoms: abdominal pain, arthralgias, chills, diarrhea and myalgias   Associated symptoms: no cough, no fever and no headaches     Past Medical History:  Diagnosis Date  . Headache   . Hypertension    during pregnancy   . IBS (irritable bowel syndrome)   . Infertility, female   . PCOS (polycystic ovarian syndrome)     Patient Active Problem List   Diagnosis Date Noted  . Severe preeclampsia 02/09/2015  . Gestational hypertension w/o significant proteinuria in 3rd trimester   . Threatened preterm labor, antepartum 01/12/2015  . Supervision of normal first pregnancy 07/28/2014  . Obesity (BMI 30.0-34.9) 07/28/2014  . Obesity  affecting pregnancy, antepartum 07/28/2014  . Infertility associated with anovulation 07/28/2014    Past Surgical History:  Procedure Laterality Date  . NO PAST SURGERIES       OB History    Gravida  1   Para  1   Term      Preterm  1   AB      Living  1     SAB      TAB      Ectopic      Multiple  0   Live Births  1            Home Medications    Prior to Admission medications   Medication Sig Start Date End Date Taking? Authorizing Provider  ciprofloxacin (CIPRO) 500 MG tablet Take 1 tablet (500 mg total) by mouth every 12 (twelve) hours. 02/24/17   Raeford RazorKohut, Hart Haas, MD  dicyclomine (BENTYL) 20 MG tablet Take 1 tablet (20 mg total) by mouth every 6 (six) hours as needed for spasms. 02/24/17   Raeford RazorKohut, Korra Christine, MD  etonogestrel-ethinyl estradiol (NUVARING) 0.12-0.015 MG/24HR vaginal ring Insert vaginally and leave in place for 3 consecutive weeks, then remove for 1 week. Patient not taking: Reported on 02/23/2017 03/25/15   Clemmons, Elmore GuiseLori A, CNM  fluconazole (DIFLUCAN) 150 MG tablet Take 1 tablet (150 mg total) by mouth once. Patient not taking: Reported on 02/23/2017 06/12/15  Reva BoresPratt, Tanya S, MD  ibuprofen (ADVIL,MOTRIN) 600 MG tablet Take 1 tablet (600 mg total) by mouth every 6 (six) hours as needed. Patient not taking: Reported on 02/23/2017 02/13/15   Erasmo DownerBacigalupo, Angela M, MD  levofloxacin (LEVAQUIN) 750 MG tablet Take 1 tablet (750 mg total) by mouth daily. Patient not taking: Reported on 02/23/2017 05/19/15   Anyanwu, Jethro BastosUgonna A, MD  metroNIDAZOLE (FLAGYL) 500 MG tablet Take 1 tablet (500 mg total) by mouth 3 (three) times daily. 02/24/17   Raeford RazorKohut, Elmer Boutelle, MD  nitrofurantoin, macrocrystal-monohydrate, (MACROBID) 100 MG capsule Take 1 capsule (100 mg total) by mouth 2 (two) times daily. Patient not taking: Reported on 02/23/2017 04/02/15   Allie Bossierove, Myra C, MD  phenazopyridine (PYRIDIUM) 200 MG tablet Take 1 tablet (200 mg total) by mouth 3 (three) times daily as needed for  pain (urethral spasm). Patient not taking: Reported on 02/23/2017 05/19/15   Tereso NewcomerAnyanwu, Ugonna A, MD  sulfamethoxazole-trimethoprim (BACTRIM DS,SEPTRA DS) 800-160 MG per tablet Take 1 tablet by mouth 2 (two) times daily. Patient not taking: Reported on 02/23/2017 04/16/15   Allie Bossierove, Myra C, MD  sulfamethoxazole-trimethoprim (BACTRIM DS,SEPTRA DS) 800-160 MG per tablet Take 1 tablet by mouth 2 (two) times daily. Patient not taking: Reported on 02/23/2017 04/27/15   Allie Bossierove, Myra C, MD    Family History Family History  Problem Relation Age of Onset  . Diabetes Maternal Grandmother   . Hypertension Maternal Grandmother   . Hypertension Paternal Grandmother     Social History Social History   Tobacco Use  . Smoking status: Never Smoker  . Smokeless tobacco: Never Used  Substance Use Topics  . Alcohol use: No  . Drug use: No     Allergies   Patient has no known allergies.   Review of Systems Review of Systems  Constitutional: Positive for activity change, appetite change, chills and diaphoresis. Negative for fever.  HENT: Negative for congestion and rhinorrhea.   Eyes: Negative for visual disturbance.  Respiratory: Negative for cough, chest tightness and shortness of breath.   Gastrointestinal: Positive for abdominal pain, diarrhea, nausea and vomiting.  Genitourinary: Negative for dysuria, hematuria, vaginal bleeding and vaginal discharge.  Musculoskeletal: Positive for arthralgias, back pain and myalgias.  Neurological: Negative for dizziness, weakness and headaches.    all other systems are negative except as noted in the HPI and PMH. \ Physical Exam Updated Vital Signs BP 122/78   Pulse 87   Temp 98.1 F (36.7 C) (Oral)   Resp 16   Ht 5\' 1"  (1.549 m)   Wt 81.6 kg   LMP 01/29/2019   SpO2 98%   BMI 34.01 kg/m   Physical Exam Vitals signs and nursing note reviewed.  Constitutional:      General: She is not in acute distress.    Appearance: She is well-developed. She is  obese.  HENT:     Head: Normocephalic and atraumatic.     Mouth/Throat:     Mouth: Mucous membranes are moist.     Pharynx: No oropharyngeal exudate.  Eyes:     Conjunctiva/sclera: Conjunctivae normal.     Pupils: Pupils are equal, round, and reactive to light.  Neck:     Musculoskeletal: Normal range of motion and neck supple.     Comments: No meningismus. Cardiovascular:     Rate and Rhythm: Normal rate and regular rhythm.     Heart sounds: Normal heart sounds. No murmur.  Pulmonary:     Effort: Pulmonary effort is normal. No respiratory distress.  Breath sounds: Normal breath sounds.  Abdominal:     Palpations: Abdomen is soft.     Tenderness: There is abdominal tenderness. There is no guarding or rebound.     Comments: Mild diffuse tenderness, no guarding or rebound  Musculoskeletal: Normal range of motion.        General: No tenderness.     Comments: No paraspinal tenderness  Skin:    General: Skin is warm.     Capillary Refill: Capillary refill takes less than 2 seconds.  Neurological:     General: No focal deficit present.     Mental Status: She is alert and oriented to person, place, and time. Mental status is at baseline.     Cranial Nerves: No cranial nerve deficit.     Motor: No abnormal muscle tone.     Coordination: Coordination normal.     Comments: No ataxia on finger to nose bilaterally. No pronator drift. 5/5 strength throughout. CN 2-12 intact.Equal grip strength. Sensation intact.   Psychiatric:        Behavior: Behavior normal.      ED Treatments / Results  Labs (all labs ordered are listed, but only abnormal results are displayed) Labs Reviewed  URINALYSIS, ROUTINE W REFLEX MICROSCOPIC - Abnormal; Notable for the following components:      Result Value   Specific Gravity, Urine 1.039 (*)    Protein, ur 30 (*)    All other components within normal limits  COMPREHENSIVE METABOLIC PANEL - Abnormal; Notable for the following components:    Glucose, Bld 121 (*)    All other components within normal limits  URINE CULTURE  PREGNANCY, URINE  CBC WITH DIFFERENTIAL/PLATELET  LIPASE, BLOOD    EKG None  Radiology Ct Abdomen Pelvis W Contrast  Result Date: 03/07/2019 CLINICAL DATA:  Acute abdominal pain. 6-7 episodes of emesis. Diarrhea for 2 weeks. EXAM: CT ABDOMEN AND PELVIS WITH CONTRAST TECHNIQUE: Multidetector CT imaging of the abdomen and pelvis was performed using the standard protocol following bolus administration of intravenous contrast. CONTRAST:  100mL OMNIPAQUE IOHEXOL 300 MG/ML  SOLN COMPARISON:  02/24/17 FINDINGS: Lower chest:  No contributory findings. Hepatobiliary: No focal liver abnormality.No evidence of biliary obstruction or stone. Pancreas: Unremarkable. Spleen: Unremarkable. Adrenals/Urinary Tract: Negative adrenals. No hydronephrosis or ureteral stone. 3 mm right lower pole calculus. Collapsed bladder which is otherwise unremarkable Stomach/Bowel:  No obstruction. No appendicitis. Vascular/Lymphatic: No acute vascular abnormality. No mass or adenopathy. Reproductive:Dominant follicle on the right. Prominent ovarian volume which is symmetric and unchanged. No detected multiplicity of follicles by CT. Other: No ascites or pneumoperitoneum. Musculoskeletal: No acute abnormalities. IMPRESSION: 1. No emergent finding.  No visible bowel inflammation. 2. Right renal calculus. Electronically Signed   By: Marnee SpringJonathon  Watts M.D.   On: 03/07/2019 05:46    Procedures Procedures (including critical care time)  Medications Ordered in ED Medications  sodium chloride 0.9 % bolus 1,000 mL (1,000 mLs Intravenous New Bag/Given 03/07/19 0403)  ondansetron (ZOFRAN) injection 4 mg (4 mg Intravenous Given 03/07/19 0403)     Initial Impression / Assessment and Plan / ED Course  I have reviewed the triage vital signs and the nursing notes.  Pertinent labs & imaging results that were available during my care of the patient were reviewed  by me and considered in my medical decision making (see chart for details).       Nausea, vomiting, abdominal pain with diarrhea and chills and diaphoresis.  Abdomen soft without peritoneal signs.  hCG negative.  Patient hydrated and given symptom control.  Abdomen is soft without peritoneal signs.  Her urinalysis is negative.  Labs show normal blood counts, LFTs and lipase.  CT scan obtained is negative for acute pathology.  Shows normal appendix.  Patient tolerating p.o.  No vomiting throughout ED course.  No episodes of diarrhea.  Unable to give a stool sample.  CT scan does not show any evidence of colitis.  We will treat symptomatically.  Have patient follow-up with PCP as well as GI for her ongoing stomach issues. Return precautions discussed.  Final Clinical Impressions(s) / ED Diagnoses   Final diagnoses:  Nausea vomiting and diarrhea    ED Discharge Orders    None       Jaymes Revels, Annie Main, MD 03/07/19 [phone]

## 2019-03-08 DIAGNOSIS — Z6832 Body mass index (BMI) 32.0-32.9, adult: Secondary | ICD-10-CM | POA: Diagnosis not present

## 2019-03-08 DIAGNOSIS — M542 Cervicalgia: Secondary | ICD-10-CM | POA: Diagnosis not present

## 2019-03-08 DIAGNOSIS — R030 Elevated blood-pressure reading, without diagnosis of hypertension: Secondary | ICD-10-CM | POA: Diagnosis not present

## 2019-03-08 DIAGNOSIS — M25512 Pain in left shoulder: Secondary | ICD-10-CM | POA: Diagnosis not present

## 2019-03-08 LAB — URINE CULTURE: Culture: <10000 — AB

## 2019-03-09 DIAGNOSIS — F411 Generalized anxiety disorder: Secondary | ICD-10-CM | POA: Diagnosis not present

## 2019-03-09 DIAGNOSIS — F329 Major depressive disorder, single episode, unspecified: Secondary | ICD-10-CM | POA: Diagnosis not present

## 2019-03-12 DIAGNOSIS — Z6830 Body mass index (BMI) 30.0-30.9, adult: Secondary | ICD-10-CM | POA: Diagnosis not present

## 2019-03-12 DIAGNOSIS — Z789 Other specified health status: Secondary | ICD-10-CM | POA: Diagnosis not present

## 2019-03-12 DIAGNOSIS — R002 Palpitations: Secondary | ICD-10-CM | POA: Diagnosis not present

## 2019-03-12 DIAGNOSIS — E282 Polycystic ovarian syndrome: Secondary | ICD-10-CM | POA: Diagnosis not present

## 2019-03-12 DIAGNOSIS — E0590 Thyrotoxicosis, unspecified without thyrotoxic crisis or storm: Secondary | ICD-10-CM | POA: Diagnosis not present

## 2019-03-12 DIAGNOSIS — R946 Abnormal results of thyroid function studies: Secondary | ICD-10-CM | POA: Diagnosis not present

## 2019-03-12 DIAGNOSIS — Z79899 Other long term (current) drug therapy: Secondary | ICD-10-CM | POA: Diagnosis not present

## 2019-03-20 DIAGNOSIS — K219 Gastro-esophageal reflux disease without esophagitis: Secondary | ICD-10-CM | POA: Diagnosis not present

## 2019-03-20 DIAGNOSIS — R7989 Other specified abnormal findings of blood chemistry: Secondary | ICD-10-CM | POA: Diagnosis not present

## 2019-03-20 DIAGNOSIS — L0100 Impetigo, unspecified: Secondary | ICD-10-CM | POA: Diagnosis not present

## 2019-03-20 DIAGNOSIS — F419 Anxiety disorder, unspecified: Secondary | ICD-10-CM | POA: Diagnosis not present

## 2019-03-28 DIAGNOSIS — R946 Abnormal results of thyroid function studies: Secondary | ICD-10-CM | POA: Diagnosis not present

## 2019-03-30 DIAGNOSIS — N979 Female infertility, unspecified: Secondary | ICD-10-CM | POA: Diagnosis not present

## 2019-03-30 DIAGNOSIS — Z8349 Family history of other endocrine, nutritional and metabolic diseases: Secondary | ICD-10-CM | POA: Diagnosis not present

## 2019-03-30 DIAGNOSIS — E282 Polycystic ovarian syndrome: Secondary | ICD-10-CM | POA: Diagnosis not present

## 2019-03-30 DIAGNOSIS — R52 Pain, unspecified: Secondary | ICD-10-CM | POA: Diagnosis not present

## 2019-03-30 DIAGNOSIS — B349 Viral infection, unspecified: Secondary | ICD-10-CM | POA: Diagnosis not present

## 2019-03-30 DIAGNOSIS — Z800 Family history of malignant neoplasm of digestive organs: Secondary | ICD-10-CM | POA: Diagnosis not present

## 2019-03-30 DIAGNOSIS — F419 Anxiety disorder, unspecified: Secondary | ICD-10-CM | POA: Diagnosis not present

## 2019-03-30 DIAGNOSIS — Z801 Family history of malignant neoplasm of trachea, bronchus and lung: Secondary | ICD-10-CM | POA: Diagnosis not present

## 2019-03-30 DIAGNOSIS — R531 Weakness: Secondary | ICD-10-CM | POA: Diagnosis not present

## 2019-03-30 DIAGNOSIS — Z98818 Other dental procedure status: Secondary | ICD-10-CM | POA: Diagnosis not present

## 2019-08-09 NOTE — L&D Delivery Note (Addendum)
OB/GYN Faculty Practice Delivery Note  Chelsea Ferguson is a 24 y.o. G2P0101 s/p spontaneous vaginal at [redacted]w[redacted]d. She was admitted for PTL.   GBS Status: unknown, ampicillin  Maximum Maternal Temperature: Temp (24hrs), Avg:98.4 F (36.9 C), Min:98.3 F (36.8 C), Max:98.5 F (36.9 C)  Labor Progress: . Admitted in preterm labor . AROM 0h 53m prior to delivery with clear fluid  . Complete dilation achieved.   Delivery Date/Time: 05/16/2020 at 1103 Delivery: Called to room and patient was complete and pushing. Head delivered LOA.  No nuchal cord present. . Shoulder and body delivered in usual fashion. Infant with spontaneous cry, dried and stimulated. Cord clamped 5 cm from umbilicus after 1-minute delay, and cut by FOB. Cord gas drawn. Cord blood obtained. Bilobed Placenta delivered spontaneously with gentle cord traction. Placenta sent to pathology. Fundus firm with massage and Pitocin. Labia, perineum, vagina, and cervix inspected with left labial laceration that required repair with 3.0, vicryl rapide. Laceration was hemostatic.  Placenta: spontaneous , intact  Complications: none  Lacerations: labial , left EBL: 25 mL  Analgesia: Epidural anesthesia  Postpartum Planning Mom to specialty care, infant to NICU  . Lactation consult . Contraception undecided   . Desires circ     Infant: Viable baby boy  APGAR: 5 & 7  1540 g  [name], M.D.  05/16/2020 11:53 AM   Midwife attestation: I was gloved and present for delivery in its entirety and I agree with the above resident's note.  Donette Larry, CNM 2:05 PM

## 2019-08-21 DIAGNOSIS — J019 Acute sinusitis, unspecified: Secondary | ICD-10-CM | POA: Diagnosis not present

## 2019-08-22 DIAGNOSIS — Z369 Encounter for antenatal screening, unspecified: Secondary | ICD-10-CM | POA: Diagnosis not present

## 2019-08-22 DIAGNOSIS — N978 Female infertility of other origin: Secondary | ICD-10-CM | POA: Diagnosis not present

## 2019-08-22 DIAGNOSIS — Z118 Encounter for screening for other infectious and parasitic diseases: Secondary | ICD-10-CM | POA: Diagnosis not present

## 2019-08-22 DIAGNOSIS — Z3202 Encounter for pregnancy test, result negative: Secondary | ICD-10-CM | POA: Diagnosis not present

## 2019-08-22 DIAGNOSIS — Z113 Encounter for screening for infections with a predominantly sexual mode of transmission: Secondary | ICD-10-CM | POA: Diagnosis not present

## 2019-08-22 DIAGNOSIS — Z1159 Encounter for screening for other viral diseases: Secondary | ICD-10-CM | POA: Diagnosis not present

## 2019-08-27 DIAGNOSIS — M542 Cervicalgia: Secondary | ICD-10-CM | POA: Diagnosis not present

## 2019-09-16 ENCOUNTER — Ambulatory Visit (INDEPENDENT_AMBULATORY_CARE_PROVIDER_SITE_OTHER): Payer: BC Managed Care – PPO | Admitting: Primary Care

## 2019-09-16 ENCOUNTER — Other Ambulatory Visit: Payer: Self-pay

## 2019-09-16 ENCOUNTER — Encounter: Payer: Self-pay | Admitting: Primary Care

## 2019-09-16 DIAGNOSIS — R202 Paresthesia of skin: Secondary | ICD-10-CM | POA: Diagnosis not present

## 2019-09-16 DIAGNOSIS — F329 Major depressive disorder, single episode, unspecified: Secondary | ICD-10-CM

## 2019-09-16 DIAGNOSIS — F419 Anxiety disorder, unspecified: Secondary | ICD-10-CM

## 2019-09-16 DIAGNOSIS — K219 Gastro-esophageal reflux disease without esophagitis: Secondary | ICD-10-CM

## 2019-09-16 MED ORDER — FAMOTIDINE 20 MG PO TABS: ORAL_TABLET | ORAL | 0 refills | Status: DC

## 2019-09-16 NOTE — Progress Notes (Signed)
Subjective:    Patient ID: Chelsea Ferguson, female    DOB: January 23, 1996, 24 y.o.   MRN: [id number]  HPI  This visit occurred during the SARS-CoV-2 public health emergency.  Safety protocols were in place, including screening questions prior to the visit, additional usage of staff PPE, and extensive cleaning of exam room while observing appropriate contact time as indicated for disinfecting solutions.   Chelsea Ferguson is a 24 year old female who presents today to establish care and discuss the problems mentioned below. Will obtain/review records.  1) GERD: Chronic and intermittent. Symptoms include chest heaviness to the substernal chest, throat burning, throat fullness that occurs after meals. She originally thought her symptoms were from anxiety, did resume Lexapro one week ago.   She's been taking omeprazole OTC daily for the last 2 months, also apple cider vinegar PRN without improvement.   2) Anxiety and Depression: Diagnosed around the age of 38. Recently resumed Lexapro 10 mg last week for symptoms of anxiety, feeling down. Overall she's doing well. She does not need refills.   Also with tingling under her left breast with radiation to left axilla that began one month ago. Intermittent daily and is mostly "tingly". She's not sure if this is secondary to anxiety or reflux. She denies changes in skin texture, breast pain.   BP Readings from Last 3 Encounters:  09/16/19 116/72  03/07/19 95/61  02/24/17 126/74    Review of Systems  HENT: Negative for congestion.   Respiratory: Negative for shortness of breath.   Gastrointestinal:       GERD symptoms   Psychiatric/Behavioral:       See HPI       Past Medical History:  Diagnosis Date  . Anxiety and depression   . Headache   . Hypertension    during pregnancy   . IBS (irritable bowel syndrome)   . Infertility, female   . PCOS (polycystic ovarian syndrome)      Social History   Socioeconomic History  . Marital status:  Married    Spouse name: Not on file  . Number of children: Not on file  . Years of education: Not on file  . Highest education level: Not on file  Occupational History  . Not on file  Tobacco Use  . Smoking status: Never Smoker  . Smokeless tobacco: Never Used  Substance and Sexual Activity  . Alcohol use: No  . Drug use: No  . Sexual activity: Not Currently    Birth control/protection: None  Other Topics Concern  . Not on file  Social History Narrative  . Not on file   Social Determinants of Health   Financial Resource Strain:   . Difficulty of Paying Living Expenses: Not on file  Food Insecurity:   . Worried About Programme researcher, broadcasting/film/video in the Last Year: Not on file  . Ran Out of Food in the Last Year: Not on file  Transportation Needs:   . Lack of Transportation (Medical): Not on file  . Lack of Transportation (Non-Medical): Not on file  Physical Activity:   . Days of Exercise per Week: Not on file  . Minutes of Exercise per Session: Not on file  Stress:   . Feeling of Stress : Not on file  Social Connections:   . Frequency of Communication with Friends and Family: Not on file  . Frequency of Social Gatherings with Friends and Family: Not on file  . Attends Religious Services: Not on file  .  Active Member of Clubs or Organizations: Not on file  . Attends Archivist Meetings: Not on file  . Marital Status: Not on file  Intimate Partner Violence:   . Fear of Current or Ex-Partner: Not on file  . Emotionally Abused: Not on file  . Physically Abused: Not on file  . Sexually Abused: Not on file    Past Surgical History:  Procedure Laterality Date  . NO PAST SURGERIES      Family History  Problem Relation Age of Onset  . Diabetes Maternal Grandmother   . Hypertension Maternal Grandmother   . Hypertension Paternal Grandmother     No Known Allergies  Current Outpatient Medications on File Prior to Visit  Medication Sig Dispense Refill  . escitalopram  (LEXAPRO) 10 MG tablet Take 10 mg by mouth daily.     No current facility-administered medications on file prior to visit.    BP 116/72   Pulse 67   Temp (!) 96.4 F (35.8 C) (Temporal)   Ht 5' 1.5" (1.562 m)   Wt 169 lb 12 oz (77 kg)   LMP 09/14/2019   SpO2 97%   BMI 31.55 kg/m    Objective:   Physical Exam  Constitutional: She appears well-nourished.  Cardiovascular: Normal rate and regular rhythm.  Respiratory: Effort normal and breath sounds normal.  Musculoskeletal:     Cervical back: Neck supple.  Skin: Skin is warm and dry.  Psychiatric: She has a normal mood and affect.           Assessment & Plan:

## 2019-09-16 NOTE — Patient Instructions (Signed)
You can take famotidine 20 mg once or twice daily for heartburn. Please update me in a few weeks.  Continue Lexapro 10 mg daily.  It was a pleasure to meet you today! Please don't hesitate to call or message me with any questions. Welcome to [facility]!

## 2019-09-16 NOTE — Assessment & Plan Note (Signed)
Intermittent, not doing well on PPI use OTC. Unclear if this is actual GERD but symptoms do represent.   Stop PPI. Start famotidine 20 mg once to twice daily, she will update.

## 2019-09-16 NOTE — Assessment & Plan Note (Signed)
Chronic and intermittent. Resumed Lexapro and is doing well. She will notify when she is needing refills.

## 2019-09-16 NOTE — Assessment & Plan Note (Signed)
Acute under left breast with radiation to left axilla. I offered a breast exam today with ultrasound, she kindly declines and will update if symptoms persist.

## 2019-09-19 DIAGNOSIS — N710 Acute inflammatory disease of uterus: Secondary | ICD-10-CM | POA: Diagnosis not present

## 2019-09-19 DIAGNOSIS — N840 Polyp of corpus uteri: Secondary | ICD-10-CM | POA: Diagnosis not present

## 2019-09-19 DIAGNOSIS — N719 Inflammatory disease of uterus, unspecified: Secondary | ICD-10-CM | POA: Diagnosis not present

## 2019-09-24 ENCOUNTER — Other Ambulatory Visit: Payer: Self-pay | Admitting: Student

## 2019-09-24 DIAGNOSIS — M542 Cervicalgia: Secondary | ICD-10-CM

## 2019-09-26 DIAGNOSIS — N907 Vulvar cyst: Secondary | ICD-10-CM | POA: Diagnosis not present

## 2019-09-26 DIAGNOSIS — E282 Polycystic ovarian syndrome: Secondary | ICD-10-CM | POA: Diagnosis not present

## 2019-09-26 DIAGNOSIS — N979 Female infertility, unspecified: Secondary | ICD-10-CM | POA: Diagnosis not present

## 2019-10-18 ENCOUNTER — Other Ambulatory Visit: Payer: BC Managed Care – PPO

## 2019-10-21 ENCOUNTER — Other Ambulatory Visit: Payer: Self-pay | Admitting: Student

## 2019-10-21 DIAGNOSIS — R079 Chest pain, unspecified: Secondary | ICD-10-CM | POA: Diagnosis not present

## 2019-10-21 DIAGNOSIS — R030 Elevated blood-pressure reading, without diagnosis of hypertension: Secondary | ICD-10-CM | POA: Diagnosis not present

## 2019-10-21 DIAGNOSIS — M542 Cervicalgia: Secondary | ICD-10-CM

## 2019-11-04 ENCOUNTER — Ambulatory Visit
Admission: RE | Admit: 2019-11-04 | Discharge: 2019-11-04 | Disposition: A | Discharge status: Home / Self Care | Payer: BC Managed Care – PPO | Source: Ambulatory Visit | Attending: Student | Admitting: Student

## 2019-11-04 ENCOUNTER — Other Ambulatory Visit: Payer: Self-pay

## 2019-11-04 ENCOUNTER — Other Ambulatory Visit: Payer: BC Managed Care – PPO

## 2019-11-04 DIAGNOSIS — M4802 Spinal stenosis, cervical region: Secondary | ICD-10-CM | POA: Diagnosis not present

## 2019-11-04 DIAGNOSIS — M542 Cervicalgia: Secondary | ICD-10-CM

## 2019-11-12 DIAGNOSIS — M4802 Spinal stenosis, cervical region: Secondary | ICD-10-CM | POA: Diagnosis not present

## 2019-11-12 DIAGNOSIS — M5020 Other cervical disc displacement, unspecified cervical region: Secondary | ICD-10-CM | POA: Diagnosis not present

## 2019-11-12 DIAGNOSIS — M5412 Radiculopathy, cervical region: Secondary | ICD-10-CM | POA: Diagnosis not present

## 2019-11-12 DIAGNOSIS — M5000 Cervical disc disorder with myelopathy, unspecified cervical region: Secondary | ICD-10-CM | POA: Diagnosis not present

## 2019-11-14 ENCOUNTER — Other Ambulatory Visit: Payer: Self-pay | Admitting: Neurosurgery

## 2019-11-14 ENCOUNTER — Encounter (HOSPITAL_COMMUNITY): Payer: Self-pay | Admitting: Vascular Surgery

## 2019-11-18 DIAGNOSIS — Z3200 Encounter for pregnancy test, result unknown: Secondary | ICD-10-CM | POA: Diagnosis not present

## 2019-11-23 DIAGNOSIS — Z3200 Encounter for pregnancy test, result unknown: Secondary | ICD-10-CM | POA: Diagnosis not present

## 2019-11-25 DIAGNOSIS — Z3200 Encounter for pregnancy test, result unknown: Secondary | ICD-10-CM | POA: Diagnosis not present

## 2019-12-03 DIAGNOSIS — O200 Threatened abortion: Secondary | ICD-10-CM | POA: Diagnosis not present

## 2019-12-04 ENCOUNTER — Inpatient Hospital Stay (HOSPITAL_COMMUNITY)
Admission: AD | Admit: 2019-12-04 | Discharge: 2019-12-05 | Disposition: A | Discharge status: Home / Self Care | Payer: BC Managed Care – PPO | Attending: Obstetrics and Gynecology | Admitting: Obstetrics and Gynecology

## 2019-12-04 DIAGNOSIS — R109 Unspecified abdominal pain: Secondary | ICD-10-CM | POA: Insufficient documentation

## 2019-12-04 DIAGNOSIS — Z3A01 Less than 8 weeks gestation of pregnancy: Secondary | ICD-10-CM | POA: Diagnosis not present

## 2019-12-04 DIAGNOSIS — Z8742 Personal history of other diseases of the female genital tract: Secondary | ICD-10-CM | POA: Diagnosis not present

## 2019-12-04 DIAGNOSIS — Z98818 Other dental procedure status: Secondary | ICD-10-CM | POA: Diagnosis not present

## 2019-12-04 DIAGNOSIS — Z8659 Personal history of other mental and behavioral disorders: Secondary | ICD-10-CM | POA: Diagnosis not present

## 2019-12-04 DIAGNOSIS — O209 Hemorrhage in early pregnancy, unspecified: Secondary | ICD-10-CM | POA: Diagnosis not present

## 2019-12-04 DIAGNOSIS — O26891 Other specified pregnancy related conditions, first trimester: Secondary | ICD-10-CM | POA: Insufficient documentation

## 2019-12-05 ENCOUNTER — Inpatient Hospital Stay (HOSPITAL_COMMUNITY): Payer: BC Managed Care – PPO

## 2019-12-05 ENCOUNTER — Encounter (HOSPITAL_COMMUNITY): Payer: Self-pay | Admitting: *Deleted

## 2019-12-05 DIAGNOSIS — O209 Hemorrhage in early pregnancy, unspecified: Secondary | ICD-10-CM

## 2019-12-05 DIAGNOSIS — Z3A01 Less than 8 weeks gestation of pregnancy: Secondary | ICD-10-CM

## 2019-12-05 DIAGNOSIS — R109 Unspecified abdominal pain: Secondary | ICD-10-CM | POA: Diagnosis not present

## 2019-12-05 DIAGNOSIS — O26891 Other specified pregnancy related conditions, first trimester: Secondary | ICD-10-CM | POA: Diagnosis not present

## 2019-12-05 LAB — URINALYSIS, ROUTINE W REFLEX MICROSCOPIC
Bilirubin Urine: NEGATIVE
Glucose, UA: NEGATIVE mg/dL
Ketones, ur: NEGATIVE mg/dL
Leukocytes,Ua: NEGATIVE
Nitrite: NEGATIVE
Protein, ur: NEGATIVE mg/dL
Specific Gravity, Urine: 1.006 (ref 1.005–1.030)
pH: 7 (ref 5.0–8.0)

## 2019-12-05 NOTE — Progress Notes (Signed)
Marie Williams CNM in earlier to discuss u/s results. Written and verbal d/c instructions given and understanding voiced 

## 2019-12-05 NOTE — MAU Note (Addendum)
Started having vag bleeding and clots tonight at 1830. Some abdominal cramping. Had blood drawn in [facility] at their hospital but did not have u/s. Was suggested to go somewhere to have u/s. IVF pregnancy in [place], [place]. BHCG today was [address]

## 2019-12-05 NOTE — MAU Provider Note (Signed)
Chief Complaint: Abdominal Pain and Vaginal Bleeding   First Provider Initiated Contact with Patient 12/05/19 0109        SUBJECTIVE HPI: Chelsea Ferguson is a 24 y.o. G2P0101 at Unknown by LMP who presents to maternity admissions reporting vaginal bleeding and pelvic cramping.  Conceived by IVF and had a normal [place] last week.  Very concerned about losing baby..  She denies vaginal itching/burning, urinary symptoms, h/a, dizziness, n/v, or fever/chills.    Abdominal Pain This is a new problem. The current episode started today. The onset quality is gradual. The problem occurs intermittently. The problem has been unchanged. The pain is mild. The quality of the pain is cramping. The abdominal pain does not radiate. Pertinent negatives include no constipation, diarrhea, dysuria, fever or headaches. Nothing aggravates the pain. The pain is relieved by nothing. She has tried nothing for the symptoms.  Vaginal Bleeding The patient's primary symptoms include pelvic pain and vaginal bleeding. The patient's pertinent negatives include no genital itching, genital lesions or genital odor. This is a new problem. The current episode started today. The problem occurs intermittently. Associated symptoms include abdominal pain. Pertinent negatives include no constipation, diarrhea, dysuria, fever or headaches. The vaginal discharge was bloody. The vaginal bleeding is typical of menses. She has been passing clots. She has not been passing tissue. Nothing aggravates the symptoms. She has tried nothing for the symptoms.   RN Note: Started having vag bleeding and clots tonight at 1830. Some abdominal cramping. Had blood drawn in [facility] at their hospital but did not have u/s. Was suggested to go somewhere to have u/s. IVF pregnancy in [place], [place]. BHCG today was [address]78   Past Medical History:  Diagnosis Date  . Anxiety and depression   . Gestational hypertension w/o significant proteinuria in 3rd trimester   .  Headache   . Hypertension    during pregnancy   . IBS (irritable bowel syndrome)   . Infertility associated with anovulation 07/28/2014   Pregnancy conceived with IVF    . Infertility, female   . Obesity affecting pregnancy, antepartum 07/28/2014  . PCOS (polycystic ovarian syndrome)   . Severe preeclampsia 02/09/2015  . Supervision of normal first pregnancy 07/28/2014    [facility] [place] Prenatal Labs  Dating  LMP c/w 6 week ultrasound Blood type: --/--/O POS (12/09 2142)  Genetic Screen 1 Screen: NT normal         AFP:  Not done           Antibody: Neg  Anatomic [place]  Normal  Rubella:  immune  GTT Third trimester: 134 RPR:   NR  TDaP vaccine 12/23/14 HBsAg:   Neg  Flu vaccine 07/14/14 HIV: NONREACTIVE (12/09 2142)   GBS  GBS: negative (01/26/15)  Contraception Undecided Pa  . Threatened preterm labor, antepartum 01/12/2015   Past Surgical History:  Procedure Laterality Date  . NO PAST SURGERIES     Social History   Socioeconomic History  . Marital status: Married    Spouse name: Not on file  . Number of children: Not on file  . Years of education: Not on file  . Highest education level: Not on file  Occupational History  . Not on file  Tobacco Use  . Smoking status: Never Smoker  . Smokeless tobacco: Never Used  Substance and Sexual Activity  . Alcohol use: No  . Drug use: No  . Sexual activity: Not Currently    Birth control/protection: None  Other Topics Concern  .  Not on file  Social History Narrative  . Not on file   Social Determinants of Health   Financial Resource Strain:   . Difficulty of Paying Living Expenses:   Food Insecurity:   . Worried About Programme researcher, broadcasting/film/video in the Last Year:   . Barista in the Last Year:   Transportation Needs:   . Freight forwarder (Medical):   Marland Kitchen Lack of Transportation (Non-Medical):   Physical Activity:   . Days of Exercise per Week:   . Minutes of Exercise per Session:   Stress:   . Feeling of Stress :   Social  Connections:   . Frequency of Communication with Friends and Family:   . Frequency of Social Gatherings with Friends and Family:   . Attends Religious Services:   . Active Member of Clubs or Organizations:   . Attends Banker Meetings:   Marland Kitchen Marital Status:   Intimate Partner Violence:   . Fear of Current or Ex-Partner:   . Emotionally Abused:   Marland Kitchen Physically Abused:   . Sexually Abused:    No current facility-administered medications on file prior to encounter.   Current Outpatient Medications on File Prior to Encounter  Medication Sig Dispense Refill  . escitalopram (LEXAPRO) 10 MG tablet Take 10 mg by mouth daily.    . famotidine (PEPCID) 20 MG tablet Take 1 tablet by mouth once to twice daily for heartburn. 60 tablet 0   No Known Allergies  I have reviewed patient's Past Medical Hx, Surgical Hx, Family Hx, Social Hx, medications and allergies.   ROS:  Review of Systems  Constitutional: Negative for fever.  Gastrointestinal: Positive for abdominal pain. Negative for constipation and diarrhea.  Genitourinary: Positive for pelvic pain and vaginal bleeding. Negative for dysuria.  Neurological: Negative for headaches.   Review of Systems  Other systems negative   Physical Exam  Physical Exam Patient Vitals for the past 24 hrs:  BP Temp Pulse Resp Height Weight  12/05/19 0050 128/72 -- 95 16 -- --  12/05/19 0016 127/72 -- 92 -- -- --  12/05/19 0015 -- 98.5 F (36.9 C) -- 18 5\' 1"  (1.549 m) 74.4 kg   Constitutional: Well-developed, well-nourished female in no acute distress.  Cardiovascular: normal rate Respiratory: normal effort GI: Abd soft, non-tender. Pos BS x 4 MS: Extremities nontender, no edema, normal ROM Neurologic: Alert and oriented x 4.  GU: Neg CVAT.  PELVIC EXAM: moderate vaginal bleeding.  Cervical check deferred due to bleeding and since I am ordering an .   LAB RESULTS Results for orders placed or performed during the hospital encounter  of 12/04/19 (from the past 24 hour(s))  Urinalysis, Routine w reflex microscopic     Status: Abnormal   Collection Time: 12/05/19 12:12 AM  Result Value Ref Range   Color, Urine YELLOW YELLOW   APPearance CLEAR CLEAR   Specific Gravity, Urine 1.006 1.005 - 1.030   pH 7.0 5.0 - 8.0   Glucose, UA NEGATIVE NEGATIVE mg/dL   Hgb urine dipstick LARGE (A) NEGATIVE   Bilirubin Urine NEGATIVE NEGATIVE   Ketones, ur NEGATIVE NEGATIVE mg/dL   Protein, ur NEGATIVE NEGATIVE mg/dL   Nitrite NEGATIVE NEGATIVE   Leukocytes,Ua NEGATIVE NEGATIVE   RBC / HPF 21-50 0 - 5 RBC/hpf   WBC, UA 0-5 0 - 5 WBC/hpf   Bacteria, UA RARE (A) NONE SEEN   Squamous Epithelial / LPF 0-5 0 - 5   Blood type O+  IMAGING No results found.  MAU Management/MDM: Ordered Ultrasound to rule out SAB  This bleeding/pain can represent a normal pregnancy with bleeding, spontaneous abortion.  The process as listed above helps to determine which of these is present.  Reviewed US findings.  They are pleased with results.  Discussed this is likely a [facility] but that since there is a heartbeat, the baby has survived thus far.  Warned there may be more bleeding. Pt to followup with OB .   ASSESSMENT 1. Vaginal bleeding in pregnancy, first trimester   2.     IVF pregnancy  PLAN Discharge home Bleeding precautions  Pt stable at time of discharge. Encouraged to return here or to other Urgent Care/ED if she develops worsening of symptoms, increase in pain, fever, or other concerning symptoms.    Hansel Feinstein CNM, MSN Certified Nurse-Midwife 12/05/2019  1:10 AM

## 2019-12-05 NOTE — Discharge Instructions (Signed)
Vaginal Bleeding During Pregnancy, First Trimester  A small amount of bleeding from the vagina (spotting) is relatively common during early pregnancy. It usually stops on its own. Various things may cause bleeding or spotting during early pregnancy. Some bleeding may be related to the pregnancy, and some may not. In many cases, the bleeding is normal and is not a problem. However, bleeding can also be a sign of something serious. Be sure to tell your health care provider about any vaginal bleeding right away. Some possible causes of vaginal bleeding during the first trimester include:  Infection or inflammation of the cervix.  Growths (polyps) on the cervix.  Miscarriage or threatened miscarriage.  Pregnancy tissue developing outside of the uterus (ectopic pregnancy).  A mass of tissue developing in the uterus due to an egg being fertilized incorrectly (molar pregnancy). Follow these instructions at home: Activity  Follow instructions from your health care provider about limiting your activity. Ask what activities are safe for you.  If needed, make plans for someone to help with your regular activities.  Do not have sex or orgasms until your health care provider says that this is safe. General instructions  Take over-the-counter and prescription medicines only as told by your health care provider.  Pay attention to any changes in your symptoms.  Do not use tampons or douche.  Write down how many pads you use each day, how often you change pads, and how soaked (saturated) they are.  If you pass any tissue from your vagina, save the tissue so you can show it to your health care provider.  Keep all follow-up visits as told by your health care provider. This is important. Contact a health care provider if:  You have vaginal bleeding during any part of your pregnancy.  You have cramps or labor pains.  You have a fever. Get help right away if:  You have severe cramps in your  back or abdomen.  You pass large clots or a large amount of tissue from your vagina.  Your bleeding increases.  You feel light-headed or weak, or you faint.  You have chills.  You are leaking fluid or have a gush of fluid from your vagina. Summary  A small amount of bleeding (spotting) from the vagina is relatively common during early pregnancy.  Various things may cause bleeding or spotting in early pregnancy.  Be sure to tell your health care provider about any vaginal bleeding right away. This information is not intended to replace advice given to you by your health care provider. Make sure you discuss any questions you have with your health care provider. Document Revised: 11/13/2018 Document Reviewed: 10/27/2016 Elsevier Patient Education  2020 Elsevier Inc.  

## 2019-12-10 DIAGNOSIS — O200 Threatened abortion: Secondary | ICD-10-CM | POA: Diagnosis not present

## 2019-12-13 DIAGNOSIS — O200 Threatened abortion: Secondary | ICD-10-CM | POA: Diagnosis not present

## 2019-12-18 ENCOUNTER — Other Ambulatory Visit (HOSPITAL_COMMUNITY)
Admission: RE | Admit: 2019-12-18 | Discharge: 2019-12-18 | Disposition: A | Discharge status: Home / Self Care | Payer: BC Managed Care – PPO | Source: Ambulatory Visit | Attending: Obstetrics & Gynecology | Admitting: Obstetrics & Gynecology

## 2019-12-18 ENCOUNTER — Other Ambulatory Visit: Payer: Self-pay

## 2019-12-18 ENCOUNTER — Encounter: Payer: Self-pay | Admitting: Obstetrics & Gynecology

## 2019-12-18 ENCOUNTER — Ambulatory Visit (INDEPENDENT_AMBULATORY_CARE_PROVIDER_SITE_OTHER): Payer: BC Managed Care – PPO | Admitting: Obstetrics & Gynecology

## 2019-12-18 VITALS — BP 134/76 | HR 70 | Wt 166.0 lb

## 2019-12-18 DIAGNOSIS — O09299 Supervision of pregnancy with other poor reproductive or obstetric history, unspecified trimester: Secondary | ICD-10-CM

## 2019-12-18 DIAGNOSIS — Z3481 Encounter for supervision of other normal pregnancy, first trimester: Secondary | ICD-10-CM | POA: Insufficient documentation

## 2019-12-18 DIAGNOSIS — O209 Hemorrhage in early pregnancy, unspecified: Secondary | ICD-10-CM | POA: Insufficient documentation

## 2019-12-18 DIAGNOSIS — Z3480 Encounter for supervision of other normal pregnancy, unspecified trimester: Secondary | ICD-10-CM | POA: Diagnosis not present

## 2019-12-18 DIAGNOSIS — Z3A08 8 weeks gestation of pregnancy: Secondary | ICD-10-CM

## 2019-12-18 HISTORY — DX: Supervision of pregnancy with other poor reproductive or obstetric history, unspecified trimester: O09.299

## 2019-12-18 NOTE — Progress Notes (Signed)
  Subjective:    Chelsea Ferguson is being seen today for her first obstetrical visit. G2P1001 conceived via IVF. Embryo transfer 11/07/2019. This is a planned pregnancy. She is at [redacted]w[redacted]d gestation. Her obstetrical history is significant for pre-eclampsia and PP hemorrhage in her pervious pregnancy. Relationship with FOB: spouse, living together. Patient does intend to breast feed. Pregnancy history fully reviewed.  Patient reports no complaints.  Review of Systems:   Review of Systems  Objective:     BP 134/76   Pulse 70   Wt 166 lb (75.3 kg)   LMP 10/17/2019   BMI 31.37 kg/m  Physical Exam  Exam General Appearance:    Alert, cooperative, no distress, appears stated age  Head:    Normocephalic, without obvious abnormality, atraumatic  Eyes:    conjunctiva/corneas clear, EOM's intact, both eyes  Ears:    Normal external ear canals, both ears  Nose:   Nares normal, septum midline, mucosa normal, no drainage    or sinus tenderness  Throat:   Lips, mucosa, and tongue normal; teeth and gums normal  Neck:   Supple, symmetrical, trachea midline, no adenopathy;    thyroid:  no enlargement/tenderness/nodules  Back:     Symmetric, no curvature, ROM normal, no CVA tenderness  Lungs:     respirations unlabored  Chest Wall:    No tenderness or deformity   Heart:    Regular rate and rhythm  Breast Exam:    No tenderness, masses, or nipple abnormality  Abdomen:     Soft, non-tender, bowel sounds active all four quadrants,    no masses, no organomegaly  Genitalia:    Normal female without lesion, discharge or tenderness   Uterus enlarged 8 weeks sized.    Extremities:   Extremities normal, atraumatic, no cyanosis or edema  Pulses:   2+ and symmetric all extremities  Skin:   Skin color, texture, turgor normal, no rashes or lesions     Assessment:    Pregnancy: G2P0101 Patient Active Problem List   Diagnosis Date Noted  . Supervision of other normal pregnancy, antepartum 12/18/2019  .  History of pre-eclampsia in prior pregnancy, currently pregnant 12/18/2019  . Antepartum bleeding, first trimester 12/18/2019  . History of postpartum hemorrhage, currently pregnant 12/18/2019  . Anxiety and depression 09/16/2019  . GERD (gastroesophageal reflux disease) 09/16/2019  . Tingling 09/16/2019  . Obesity (BMI 30.0-34.9) 07/28/2014       Plan:     Initial labs drawn. Prenatal vitamins. Problem list reviewed and updated. AFP3 discussed: requested. Role of ultrasound in pregnancy discussed; fetal survey: requested. Amniocentesis discussed: not indicated. Follow up in 4 weeks. 60% of 40 min visit spent on counseling and coordination of care.  Begin baby ASA at 12 weeks.  Stop progestins at 12 weeks     Willodean Rosenthal 12/18/2019

## 2019-12-18 NOTE — Patient Instructions (Signed)
First Trimester of Pregnancy The first trimester of pregnancy is from week 1 until the end of week 13 (months 1 through 3). A week after a sperm fertilizes an egg, the egg will implant on the wall of the uterus. This embryo will begin to develop into a baby. Genes from you and your partner will form the baby. The female genes will determine whether the baby will be a boy or a girl. At 6-8 weeks, the eyes and face will be formed, and the heartbeat can be seen on ultrasound. At the end of 12 weeks, all the baby's organs will be formed. Now that you are pregnant, you will want to do everything you can to have a healthy baby. Two of the most important things are to get good prenatal care and to follow your health care provider's instructions. Prenatal care is all the medical care you receive before the baby's birth. This care will help prevent, find, and treat any problems during the pregnancy and childbirth. Body changes during your first trimester Your body goes through many changes during pregnancy. The changes vary from woman to woman.  You may gain or lose a couple of pounds at first.  You may feel sick to your stomach (nauseous) and you may throw up (vomit). If the vomiting is uncontrollable, call your health care provider.  You may tire easily.  You may develop headaches that can be relieved by medicines. All medicines should be approved by your health care provider.  You may urinate more often. Painful urination may mean you have a bladder infection.  You may develop heartburn as a result of your pregnancy.  You may develop constipation because certain hormones are causing the muscles that push stool through your intestines to slow down.  You may develop hemorrhoids or swollen veins (varicose veins).  Your breasts may begin to grow larger and become tender. Your nipples may stick out more, and the tissue that surrounds them (areola) may become darker.  Your gums may bleed and may be  sensitive to brushing and flossing.  Dark spots or blotches (chloasma, mask of pregnancy) may develop on your face. This will likely fade after the baby is born.  Your menstrual periods will stop.  You may have a loss of appetite.  You may develop cravings for certain kinds of food.  You may have changes in your emotions from day to day, such as being excited to be pregnant or being concerned that something may go wrong with the pregnancy and baby.  You may have more vivid and strange dreams.  You may have changes in your hair. These can include thickening of your hair, rapid growth, and changes in texture. Some women also have hair loss during or after pregnancy, or hair that feels dry or thin. Your hair will most likely return to normal after your baby is born. What to expect at prenatal visits During a routine prenatal visit:  You will be weighed to make sure you and the baby are growing normally.  Your blood pressure will be taken.  Your abdomen will be measured to track your baby's growth.  The fetal heartbeat will be listened to between weeks 10 and 14 of your pregnancy.  Test results from any previous visits will be discussed. Your health care provider may ask you:  How you are feeling.  If you are feeling the baby move.  If you have had any abnormal symptoms, such as leaking fluid, bleeding, severe headaches, or abdominal   cramping.  If you are using any tobacco products, including cigarettes, chewing tobacco, and electronic cigarettes.  If you have any questions. Other tests that may be performed during your first trimester include:  Blood tests to find your blood type and to check for the presence of any previous infections. The tests will also be used to check for low iron levels (anemia) and protein on red blood cells (Rh antibodies). Depending on your risk factors, or if you previously had diabetes during pregnancy, you may have tests to check for high blood sugar  that affects pregnant women (gestational diabetes).  Urine tests to check for infections, diabetes, or protein in the urine.  An ultrasound to confirm the proper growth and development of the baby.  Fetal screens for spinal cord problems (spina bifida) and Down syndrome.  HIV (human immunodeficiency virus) testing. Routine prenatal testing includes screening for HIV, unless you choose not to have this test.  You may need other tests to make sure you and the baby are doing well. Follow these instructions at home: Medicines  Follow your health care provider's instructions regarding medicine use. Specific medicines may be either safe or unsafe to take during pregnancy.  Take a prenatal vitamin that contains at least 600 micrograms (mcg) of folic acid.  If you develop constipation, try taking a stool softener if your health care provider approves. Eating and drinking   Eat a balanced diet that includes fresh fruits and vegetables, whole grains, good sources of protein such as meat, eggs, or tofu, and low-fat dairy. Your health care provider will help you determine the amount of weight gain that is right for you.  Avoid raw meat and uncooked cheese. These carry germs that can cause birth defects in the baby.  Eating four or five small meals rather than three large meals a day may help relieve nausea and vomiting. If you start to feel nauseous, eating a few soda crackers can be helpful. Drinking liquids between meals, instead of during meals, also seems to help ease nausea and vomiting.  Limit foods that are high in fat and processed sugars, such as fried and sweet foods.  To prevent constipation: ? Eat foods that are high in fiber, such as fresh fruits and vegetables, whole grains, and beans. ? Drink enough fluid to keep your urine clear or pale yellow. Activity  Exercise only as directed by your health care provider. Most women can continue their usual exercise routine during  pregnancy. Try to exercise for 30 minutes at least 5 days a week. Exercising will help you: ? Control your weight. ? Stay in shape. ? Be prepared for labor and delivery.  Experiencing pain or cramping in the lower abdomen or lower back is a good sign that you should stop exercising. Check with your health care provider before continuing with normal exercises.  Try to avoid standing for long periods of time. Move your legs often if you must stand in one place for a long time.  Avoid heavy lifting.  Wear low-heeled shoes and practice good posture.  You may continue to have sex unless your health care provider tells you not to. Relieving pain and discomfort  Wear a good support bra to relieve breast tenderness.  Take warm sitz baths to soothe any pain or discomfort caused by hemorrhoids. Use hemorrhoid cream if your health care provider approves.  Rest with your legs elevated if you have leg cramps or low back pain.  If you develop varicose veins in   your legs, wear support hose. Elevate your feet for 15 minutes, 3-4 times a day. Limit salt in your diet. Prenatal care  Schedule your prenatal visits by the twelfth week of pregnancy. They are usually scheduled monthly at first, then more often in the last 2 months before delivery.  Write down your questions. Take them to your prenatal visits.  Keep all your prenatal visits as told by your health care provider. This is important. Safety  Wear your seat belt at all times when driving.  Make a list of emergency phone numbers, including numbers for family, friends, the hospital, and police and fire departments. General instructions  Ask your health care provider for a referral to a local prenatal education class. Begin classes no later than the beginning of month 6 of your pregnancy.  Ask for help if you have counseling or nutritional needs during pregnancy. Your health care provider can offer advice or refer you to specialists for help  with various needs.  Do not use hot tubs, steam rooms, or saunas.  Do not douche or use tampons or scented sanitary pads.  Do not cross your legs for long periods of time.  Avoid cat litter boxes and soil used by cats. These carry germs that can cause birth defects in the baby and possibly loss of the fetus by miscarriage or stillbirth.  Avoid all smoking, herbs, alcohol, and medicines not prescribed by your health care provider. Chemicals in these products affect the formation and growth of the baby.  Do not use any products that contain nicotine or tobacco, such as cigarettes and e-cigarettes. If you need help quitting, ask your health care provider. You may receive counseling support and other resources to help you quit.  Schedule a dentist appointment. At home, brush your teeth with a soft toothbrush and be gentle when you floss. Contact a health care provider if:  You have dizziness.  You have mild pelvic cramps, pelvic pressure, or nagging pain in the abdominal area.  You have persistent nausea, vomiting, or diarrhea.  You have a bad smelling vaginal discharge.  You have pain when you urinate.  You notice increased swelling in your face, hands, legs, or ankles.  You are exposed to fifth disease or chickenpox.  You are exposed to German measles (rubella) and have never had it. Get help right away if:  You have a fever.  You are leaking fluid from your vagina.  You have spotting or bleeding from your vagina.  You have severe abdominal cramping or pain.  You have rapid weight gain or loss.  You vomit blood or material that looks like coffee grounds.  You develop a severe headache.  You have shortness of breath.  You have any kind of trauma, such as from a fall or a car accident. Summary  The first trimester of pregnancy is from week 1 until the end of week 13 (months 1 through 3).  Your body goes through many changes during pregnancy. The changes vary from  woman to woman.  You will have routine prenatal visits. During those visits, your health care provider will examine you, discuss any test results you may have, and talk with you about how you are feeling. This information is not intended to replace advice given to you by your health care provider. Make sure you discuss any questions you have with your health care provider. Document Revised: 07/07/2017 Document Reviewed: 07/06/2016 Elsevier Patient Education  2020 Elsevier Inc.  

## 2019-12-19 ENCOUNTER — Encounter: Payer: BC Managed Care – PPO | Admitting: Family Medicine

## 2019-12-19 LAB — COMPREHENSIVE METABOLIC PANEL
ALT: 3 IU/L (ref 0–32)
AST: 13 IU/L (ref 0–40)
Albumin/Globulin Ratio: 1.7 (ref 1.2–2.2)
Albumin: 4 g/dL (ref 3.9–5.0)
Alkaline Phosphatase: 72 IU/L (ref 39–117)
BUN/Creatinine Ratio: 6 — ABNORMAL LOW (ref 9–23)
BUN: 4 mg/dL — ABNORMAL LOW (ref 6–20)
Bilirubin Total: <0.2 mg/dL (ref 0.0–1.2)
CO2: 20 mmol/L (ref 20–29)
Calcium: 9.3 mg/dL (ref 8.7–10.2)
Chloride: 104 mmol/L (ref 96–106)
Creatinine, Ser: 0.62 mg/dL (ref 0.57–1.00)
GFR calc Af Amer: 144 mL/min/{1.73_m2} (ref >59)
GFR calc non Af Amer: 125 mL/min/{1.73_m2} (ref >59)
Globulin, Total: 2.4 g/dL (ref 1.5–4.5)
Glucose: 83 mg/dL (ref 65–99)
Potassium: 4.4 mmol/L (ref 3.5–5.2)
Sodium: 139 mmol/L (ref 134–144)
Total Protein: 6.4 g/dL (ref 6.0–8.5)

## 2019-12-19 LAB — OBSTETRIC PANEL, INCLUDING HIV
Antibody Screen: NEGATIVE
Basophils Absolute: 0.1 10*3/uL (ref 0.0–0.2)
Basos: 1 %
EOS (ABSOLUTE): 0.2 10*3/uL (ref 0.0–0.4)
Eos: 2 %
HIV Screen 4th Generation wRfx: NONREACTIVE
Hematocrit: 37.3 % (ref 34.0–46.6)
Hemoglobin: 12.8 g/dL (ref 11.1–15.9)
Hepatitis B Surface Ag: NEGATIVE
Immature Grans (Abs): 0 10*3/uL (ref 0.0–0.1)
Immature Granulocytes: 0 %
Lymphocytes Absolute: 2.4 10*3/uL (ref 0.7–3.1)
Lymphs: 22 %
MCH: 30.5 pg (ref 26.6–33.0)
MCHC: 34.3 g/dL (ref 31.5–35.7)
MCV: 89 fL (ref 79–97)
Monocytes Absolute: 0.6 10*3/uL (ref 0.1–0.9)
Monocytes: 6 %
Neutrophils Absolute: 7.5 10*3/uL — ABNORMAL HIGH (ref 1.4–7.0)
Neutrophils: 69 %
Platelets: 296 10*3/uL (ref 150–450)
RBC: 4.19 x10E6/uL (ref 3.77–5.28)
RDW: 14.4 % (ref 11.7–15.4)
RPR Ser Ql: NONREACTIVE
Rh Factor: POSITIVE
Rubella Antibodies, IGG: 1.18 index (ref >0.99)
WBC: 10.8 10*3/uL (ref 3.4–10.8)

## 2019-12-19 LAB — HEPATITIS C ANTIBODY: Hep C Virus Ab: 0.1 s/co ratio (ref 0.0–0.9)

## 2019-12-19 LAB — HEMOGLOBIN A1C
Est. average glucose Bld gHb Est-mCnc: 103 mg/dL
Hgb A1c MFr Bld: 5.2 % (ref 4.8–5.6)

## 2019-12-20 LAB — CYTOLOGY - PAP
Chlamydia: NEGATIVE
Comment: NEGATIVE
Comment: NORMAL
Diagnosis: NEGATIVE
Neisseria Gonorrhea: NEGATIVE

## 2019-12-20 LAB — URINE CULTURE: Organism ID, Bacteria: NO GROWTH

## 2019-12-30 DIAGNOSIS — O200 Threatened abortion: Secondary | ICD-10-CM | POA: Diagnosis not present

## 2020-01-17 ENCOUNTER — Ambulatory Visit (INDEPENDENT_AMBULATORY_CARE_PROVIDER_SITE_OTHER): Payer: BC Managed Care – PPO | Admitting: Family Medicine

## 2020-01-17 ENCOUNTER — Other Ambulatory Visit: Payer: Self-pay

## 2020-01-17 VITALS — BP 127/65 | HR 66 | Wt 165.0 lb

## 2020-01-17 DIAGNOSIS — F419 Anxiety disorder, unspecified: Secondary | ICD-10-CM

## 2020-01-17 DIAGNOSIS — O99611 Diseases of the digestive system complicating pregnancy, first trimester: Secondary | ICD-10-CM

## 2020-01-17 DIAGNOSIS — F32A Depression, unspecified: Secondary | ICD-10-CM

## 2020-01-17 DIAGNOSIS — K219 Gastro-esophageal reflux disease without esophagitis: Secondary | ICD-10-CM

## 2020-01-17 DIAGNOSIS — F329 Major depressive disorder, single episode, unspecified: Secondary | ICD-10-CM

## 2020-01-17 DIAGNOSIS — Z8759 Personal history of other complications of pregnancy, childbirth and the puerperium: Secondary | ICD-10-CM

## 2020-01-17 DIAGNOSIS — O09299 Supervision of pregnancy with other poor reproductive or obstetric history, unspecified trimester: Secondary | ICD-10-CM

## 2020-01-17 DIAGNOSIS — Z3A13 13 weeks gestation of pregnancy: Secondary | ICD-10-CM

## 2020-01-17 DIAGNOSIS — Z3480 Encounter for supervision of other normal pregnancy, unspecified trimester: Secondary | ICD-10-CM

## 2020-01-17 DIAGNOSIS — O99341 Other mental disorders complicating pregnancy, first trimester: Secondary | ICD-10-CM

## 2020-01-17 DIAGNOSIS — O09291 Supervision of pregnancy with other poor reproductive or obstetric history, first trimester: Secondary | ICD-10-CM

## 2020-01-17 MED ORDER — PANTOPRAZOLE SODIUM 40 MG PO TBEC: 40.0000 mg | DELAYED_RELEASE_TABLET | Freq: Every day | ORAL | 3 refills | Status: DC

## 2020-01-17 NOTE — Progress Notes (Signed)
   PRENATAL VISIT NOTE  Subjective:  Chelsea Ferguson is a 24 y.o. G2P0101 at [redacted]w[redacted]d being seen today for ongoing prenatal care.  She is currently monitored for the following issues for this high-risk pregnancy and has Obesity (BMI 30.0-34.9); Anxiety and depression; GERD (gastroesophageal reflux disease); Tingling; Supervision of other normal pregnancy, antepartum; History of pre-eclampsia in prior pregnancy, currently pregnant; Antepartum bleeding, first trimester; and History of postpartum hemorrhage, currently pregnant on their problem list.  Patient reports that her anxiety is improved with the start of lexapro. She did have bleeding in the beginning part of the pregnancy, but this has stopped.  Contractions: Not present. Vag. Bleeding: None.  Movement: Absent. Denies leaking of fluid.   The following portions of the patient's history were reviewed and updated as appropriate: allergies, current medications, past family history, past medical history, past social history, past surgical history and problem list.   Objective:   Vitals:   01/17/20 1021  BP: 127/65  Pulse: 66  Weight: 165 lb (74.8 kg)    Fetal Status: Fetal Heart Rate (bpm): 161   Movement: Absent     General:  Alert, oriented and cooperative. Patient is in no acute distress.  Skin: Skin is warm and dry. No rash noted.   Cardiovascular: Normal heart rate noted  Respiratory: Normal respiratory effort, no problems with respiration noted  Abdomen: Soft, gravid, appropriate for gestational age.  Pain/Pressure: Absent     Pelvic: Cervical exam deferred        Extremities: Normal range of motion.  Edema: None  Mental Status: Normal mood and affect. Normal behavior. Normal judgment and thought content.   Assessment and Plan:  Pregnancy: G2P0101 at [redacted]w[redacted]d 1. Supervision of other normal pregnancy, antepartum FHT and FH normal - [place] MFM OB COMP + 14 WK; Future  2. History of pre-eclampsia in prior pregnancy, currently  pregnant Start ASA 81mg   3. History of postpartum hemorrhage, currently pregnant Will aggressively treat intrapartum: TXA at time of delivery and prophylactic cytotec.  4. Gastroesophageal reflux disease, unspecified whether esophagitis present Change to protonix as pepcid not helpful currently. May need to add pepcid back on for dual control  5. Anxiety and depression Improved on lexapro.  Preterm labor symptoms and general obstetric precautions including but not limited to vaginal bleeding, contractions, leaking of fluid and fetal movement were reviewed in detail with the patient. Please refer to After Visit Summary for other counseling recommendations.   No follow-ups on file.  Future Appointments  Date Time Provider Department Center  02/14/2020 10:15 AM 04/16/2020, DO [facility] None  03/02/2020  9:45 AM [facility]4 [facility]    03/04/2020, DO

## 2020-02-14 ENCOUNTER — Ambulatory Visit (INDEPENDENT_AMBULATORY_CARE_PROVIDER_SITE_OTHER): Payer: BC Managed Care – PPO | Admitting: Family Medicine

## 2020-02-14 ENCOUNTER — Other Ambulatory Visit: Payer: Self-pay

## 2020-02-14 VITALS — BP 110/65 | HR 71 | Wt 167.0 lb

## 2020-02-14 DIAGNOSIS — Z3480 Encounter for supervision of other normal pregnancy, unspecified trimester: Secondary | ICD-10-CM

## 2020-02-14 DIAGNOSIS — Z3A17 17 weeks gestation of pregnancy: Secondary | ICD-10-CM

## 2020-02-14 DIAGNOSIS — O09299 Supervision of pregnancy with other poor reproductive or obstetric history, unspecified trimester: Secondary | ICD-10-CM

## 2020-02-14 DIAGNOSIS — O99342 Other mental disorders complicating pregnancy, second trimester: Secondary | ICD-10-CM

## 2020-02-14 DIAGNOSIS — F419 Anxiety disorder, unspecified: Secondary | ICD-10-CM

## 2020-02-14 DIAGNOSIS — O99612 Diseases of the digestive system complicating pregnancy, second trimester: Secondary | ICD-10-CM

## 2020-02-14 DIAGNOSIS — K219 Gastro-esophageal reflux disease without esophagitis: Secondary | ICD-10-CM

## 2020-02-14 DIAGNOSIS — F329 Major depressive disorder, single episode, unspecified: Secondary | ICD-10-CM

## 2020-02-14 DIAGNOSIS — O09292 Supervision of pregnancy with other poor reproductive or obstetric history, second trimester: Secondary | ICD-10-CM

## 2020-02-14 MED ORDER — DOXYLAMINE-PYRIDOXINE 10-10 MG PO TBEC: 2.0000 | DELAYED_RELEASE_TABLET | Freq: Every day | ORAL | 5 refills | Status: DC

## 2020-02-14 NOTE — Progress Notes (Signed)
    PRENATAL VISIT NOTE  Subjective:  Chelsea Ferguson is a 24 y.o. G2P0101 at [redacted]w[redacted]d being seen today for ongoing prenatal care.  She is currently monitored for the following issues for this low-risk pregnancy and has Obesity (BMI 30.0-34.9); Anxiety and depression; GERD (gastroesophageal reflux disease); Tingling; Supervision of other normal pregnancy, antepartum; History of pre-eclampsia in prior pregnancy, currently pregnant; Antepartum bleeding, first trimester; and History of postpartum hemorrhage, currently pregnant on their problem list.  Patient reports nausea and vomiting.  Contractions: Not present. Vag. Bleeding: None.  Movement: Absent. Denies leaking of fluid.   The following portions of the patient's history were reviewed and updated as appropriate: allergies, current medications, past family history, past medical history, past social history, past surgical history and problem list.   Objective:   Vitals:   02/14/20 0951  BP: 110/65  Pulse: 71  Weight: 167 lb (75.8 kg)    Fetal Status: Fetal Heart Rate (bpm): 158 Fundal Height: 17 cm Movement: Absent     General:  Alert, oriented and cooperative. Patient is in no acute distress.  Skin: Skin is warm and dry. No rash noted.   Cardiovascular: Normal heart rate noted  Respiratory: Normal respiratory effort, no problems with respiration noted  Abdomen: Soft, gravid, appropriate for gestational age.  Pain/Pressure: Absent     Pelvic: Cervical exam deferred        Extremities: Normal range of motion.  Edema: None  Mental Status: Normal mood and affect. Normal behavior. Normal judgment and thought content.   Assessment and Plan:  Pregnancy: G2P0101 at [redacted]w[redacted]d 1. Supervision of other normal pregnancy, antepartum FHT and FH normal. Diclegis for nausea  2. History of pre-eclampsia in prior pregnancy, currently pregnant On ASA 81mg   3. History of postpartum hemorrhage, currently pregnant Aggressive ppx  4.  Gastroesophageal reflux disease, unspecified whether esophagitis present On protonix  5. Anxiety and depression   Preterm labor symptoms and general obstetric precautions including but not limited to vaginal bleeding, contractions, leaking of fluid and fetal movement were reviewed in detail with the patient. Please refer to After Visit Summary for other counseling recommendations.   No follow-ups on file.  Future Appointments  Date Time Provider Department Center  03/02/2020  9:45 AM [facility] US4 [facility]  03/12/2020 10:15 AM 05/12/2020, DO [facility] None    Levie Heritage, DO

## 2020-02-17 ENCOUNTER — Other Ambulatory Visit: Payer: Self-pay

## 2020-02-17 ENCOUNTER — Inpatient Hospital Stay (HOSPITAL_COMMUNITY)
Admission: AD | Admit: 2020-02-17 | Discharge: 2020-02-18 | Disposition: A | Discharge status: Home / Self Care | Payer: BC Managed Care – PPO | Attending: Obstetrics & Gynecology | Admitting: Obstetrics & Gynecology

## 2020-02-17 ENCOUNTER — Encounter (HOSPITAL_COMMUNITY): Payer: Self-pay | Admitting: Obstetrics & Gynecology

## 2020-02-17 DIAGNOSIS — Z8759 Personal history of other complications of pregnancy, childbirth and the puerperium: Secondary | ICD-10-CM | POA: Diagnosis not present

## 2020-02-17 DIAGNOSIS — F329 Major depressive disorder, single episode, unspecified: Secondary | ICD-10-CM | POA: Insufficient documentation

## 2020-02-17 DIAGNOSIS — O26892 Other specified pregnancy related conditions, second trimester: Secondary | ICD-10-CM | POA: Insufficient documentation

## 2020-02-17 DIAGNOSIS — R509 Fever, unspecified: Secondary | ICD-10-CM | POA: Insufficient documentation

## 2020-02-17 DIAGNOSIS — Z20822 Contact with and (suspected) exposure to covid-19: Secondary | ICD-10-CM | POA: Diagnosis not present

## 2020-02-17 DIAGNOSIS — E282 Polycystic ovarian syndrome: Secondary | ICD-10-CM | POA: Diagnosis not present

## 2020-02-17 DIAGNOSIS — R05 Cough: Secondary | ICD-10-CM

## 2020-02-17 DIAGNOSIS — Z79899 Other long term (current) drug therapy: Secondary | ICD-10-CM | POA: Insufficient documentation

## 2020-02-17 DIAGNOSIS — E669 Obesity, unspecified: Secondary | ICD-10-CM | POA: Insufficient documentation

## 2020-02-17 DIAGNOSIS — O99212 Obesity complicating pregnancy, second trimester: Secondary | ICD-10-CM | POA: Insufficient documentation

## 2020-02-17 DIAGNOSIS — O219 Vomiting of pregnancy, unspecified: Secondary | ICD-10-CM | POA: Insufficient documentation

## 2020-02-17 DIAGNOSIS — E860 Dehydration: Secondary | ICD-10-CM | POA: Diagnosis not present

## 2020-02-17 DIAGNOSIS — O99891 Other specified diseases and conditions complicating pregnancy: Secondary | ICD-10-CM | POA: Diagnosis not present

## 2020-02-17 DIAGNOSIS — O99612 Diseases of the digestive system complicating pregnancy, second trimester: Secondary | ICD-10-CM | POA: Insufficient documentation

## 2020-02-17 DIAGNOSIS — O99280 Endocrine, nutritional and metabolic diseases complicating pregnancy, unspecified trimester: Secondary | ICD-10-CM

## 2020-02-17 DIAGNOSIS — F419 Anxiety disorder, unspecified: Secondary | ICD-10-CM | POA: Diagnosis not present

## 2020-02-17 DIAGNOSIS — Z3A17 17 weeks gestation of pregnancy: Secondary | ICD-10-CM | POA: Diagnosis not present

## 2020-02-17 DIAGNOSIS — O10912 Unspecified pre-existing hypertension complicating pregnancy, second trimester: Secondary | ICD-10-CM | POA: Diagnosis not present

## 2020-02-17 DIAGNOSIS — R0602 Shortness of breath: Secondary | ICD-10-CM | POA: Diagnosis not present

## 2020-02-17 DIAGNOSIS — R112 Nausea with vomiting, unspecified: Secondary | ICD-10-CM | POA: Diagnosis not present

## 2020-02-17 DIAGNOSIS — O99342 Other mental disorders complicating pregnancy, second trimester: Secondary | ICD-10-CM | POA: Insufficient documentation

## 2020-02-17 DIAGNOSIS — R079 Chest pain, unspecified: Secondary | ICD-10-CM | POA: Insufficient documentation

## 2020-02-17 DIAGNOSIS — O99282 Endocrine, nutritional and metabolic diseases complicating pregnancy, second trimester: Secondary | ICD-10-CM | POA: Diagnosis not present

## 2020-02-17 DIAGNOSIS — O09292 Supervision of pregnancy with other poor reproductive or obstetric history, second trimester: Secondary | ICD-10-CM | POA: Insufficient documentation

## 2020-02-17 DIAGNOSIS — K589 Irritable bowel syndrome without diarrhea: Secondary | ICD-10-CM | POA: Diagnosis not present

## 2020-02-17 LAB — URINALYSIS, ROUTINE W REFLEX MICROSCOPIC
Bilirubin Urine: NEGATIVE
Glucose, UA: NEGATIVE mg/dL
Hgb urine dipstick: NEGATIVE
Ketones, ur: >80 mg/dL — AB
Nitrite: NEGATIVE
Protein, ur: 30 mg/dL — AB
Specific Gravity, Urine: 1.01 (ref 1.005–1.030)
pH: 7.5 (ref 5.0–8.0)

## 2020-02-17 LAB — URINALYSIS, MICROSCOPIC (REFLEX)

## 2020-02-17 LAB — SARS CORONAVIRUS 2 BY RT PCR (HOSPITAL ORDER, PERFORMED IN ~~LOC~~ HOSPITAL LAB): SARS Coronavirus 2: NEGATIVE

## 2020-02-17 MED ORDER — M.V.I. ADULT IV INJ
Freq: Once | INTRAVENOUS | Status: AC
  Filled 2020-02-17: qty 10

## 2020-02-17 MED ORDER — FAMOTIDINE IN NACL 20-0.9 MG/50ML-% IV SOLN
20.0000 mg | Freq: Once | INTRAVENOUS | Status: AC
  Administered 2020-02-18: 20 mg via INTRAVENOUS
  Filled 2020-02-17: qty 50

## 2020-02-17 MED ORDER — LACTATED RINGERS IV BOLUS
1000.0000 mL | Freq: Once | INTRAVENOUS | Status: AC
  Administered 2020-02-18: 1000 mL via INTRAVENOUS

## 2020-02-17 MED ORDER — ONDANSETRON HCL 4 MG/2ML IJ SOLN: 4.0000 mg | Freq: Once | INTRAMUSCULAR | Status: DC

## 2020-02-17 NOTE — MAU Provider Note (Signed)
History     CSN: [id number]  Arrival date and time: 02/17/20 1723   First Provider Initiated Contact with Patient 02/17/20 2307      Chief Complaint  Patient presents with  . Chills  . Generalized Body Aches  . Emesis   RONNESHA MESTER is a 24 y.o. G2P1 at [redacted]w[redacted]d who presents to MAU with complaints of nausea, vomiting, body aches and chills. Patient reports symptoms started occurring 2 days ago, patient presented to MAU earlier with same complaints and was sent to [facility] to r/o COVID. COVID negative and was transferred back to MAU for completion of treatment. Patient reports emesis 10-12+ times today. Patient reports feeling weak, dehydrated and has not slept in the past 2 days d/t continued emesis. Patient denies abdominal pain, cramping, contractions, discharge or bleeding. She denies diarrhea, constipation. +FM.       OB History    Gravida  2   Para  1   Term      Preterm  1   AB      Living  1     SAB      TAB      Ectopic      Multiple  0   Live Births  1           Past Medical History:  Diagnosis Date  . Anxiety and depression   . Gestational hypertension w/o significant proteinuria in 3rd trimester   . Headache   . Hypertension    during pregnancy   . IBS (irritable bowel syndrome)   . Infertility associated with anovulation 07/28/2014   Pregnancy conceived with IVF    . Infertility, female   . Obesity affecting pregnancy, antepartum 07/28/2014  . PCOS (polycystic ovarian syndrome)   . Severe preeclampsia 02/09/2015  . Supervision of normal first pregnancy 07/28/2014    Clinic [place] Prenatal Labs  Dating  LMP c/w 6 week ultrasound Blood type: --/--/O POS (12/09 2142)  Genetic Screen 1 Screen: NT normal         AFP:  Not done           Antibody: Neg  Anatomic [place]  Normal  Rubella:  immune  GTT Third trimester: 134 RPR:   NR  TDaP vaccine 12/23/14 HBsAg:   Neg  Flu vaccine 07/14/14 HIV: NONREACTIVE (12/09 2142)   GBS  GBS: negative (01/26/15)   Contraception Undecided Pa  . Threatened preterm labor, antepartum 01/12/2015    Past Surgical History:  Procedure Laterality Date  . NO PAST SURGERIES      Family History  Problem Relation Age of Onset  . Diabetes Maternal Grandmother   . Hypertension Maternal Grandmother   . Hypertension Paternal Grandmother   . Cancer Neg Hx     Social History   Tobacco Use  . Smoking status: Never Smoker  . Smokeless tobacco: Never Used  Vaping Use  . Vaping Use: Never used  Substance Use Topics  . Alcohol use: No  . Drug use: No    Allergies: No Known Allergies  Medications Prior to Admission  Medication Sig Dispense Refill Last Dose  . Doxylamine-Pyridoxine (DICLEGIS) 10-10 MG TBEC Take 2 tablets by mouth at bedtime. If symptoms persist, add one tablet in the morning and one in the afternoon 100 tablet 5 02/17/2020 at Unknown time  . escitalopram (LEXAPRO) 10 MG tablet Take 10 mg by mouth daily.   02/16/2020 at Unknown time  . famotidine (PEPCID) 20 MG tablet Take 1 tablet  by mouth once to twice daily for heartburn. 60 tablet 0 02/16/2020 at Unknown time  . pantoprazole (PROTONIX) 40 MG tablet Take 1 tablet (40 mg total) by mouth daily. 30 tablet 3 02/16/2020 at Unknown time  . Prenatal Vit-Fe Fumarate-FA (PRENATAL VITAMINS PO) Take by mouth.   02/17/2020 at Unknown time    Review of Systems  Constitutional: Positive for chills. Negative for fever.       Generalized body aches   Respiratory: Negative.   Cardiovascular: Negative.   Gastrointestinal: Positive for nausea and vomiting. Negative for abdominal pain, constipation and diarrhea.  Genitourinary: Negative.   Musculoskeletal: Negative.   Neurological: Negative.   Psychiatric/Behavioral: Negative.    Physical Exam   Blood pressure 130/67, pulse (!) 106, temperature 98 F (36.7 C), resp. rate 17, height 5\' 1"  (1.549 m), weight 74.8 kg, last menstrual period 10/17/2019, SpO2 98 %.  Physical Exam HENT:     Head:  Normocephalic.  Cardiovascular:     Rate and Rhythm: Normal rate and regular rhythm.  Pulmonary:     Effort: Pulmonary effort is normal. No respiratory distress.     Breath sounds: Normal breath sounds. No wheezing.  Abdominal:     Palpations: Abdomen is soft. There is no mass.     Tenderness: There is no abdominal tenderness. There is no guarding.  Skin:    Coloration: Skin is pale.  Neurological:     Mental Status: She is alert and oriented to person, place, and time.  Psychiatric:        Mood and Affect: Mood normal.        Behavior: Behavior normal.        Thought Content: Thought content normal.     FHR 155 by doppler   MAU Course  Procedures  MDM Orders Placed This Encounter  Procedures  . SARS Coronavirus 2 by RT PCR (hospital order, performed in [facility] hospital lab) Nasopharyngeal Nasopharyngeal Swab  . Urinalysis, Routine w reflex microscopic  . Urinalysis, Microscopic (reflex)  . CBC  . Comprehensive metabolic panel  . Insert peripheral IV   Meds ordered this encounter  Medications  . lactated ringers bolus 1,000 mL  . multivitamins adult (INFUVITE ADULT) 10 mL in dextrose 5% lactated ringers 1,000 mL infusion  . ondansetron (ZOFRAN) injection 4 mg  . famotidine (PEPCID) IVPB 20 mg premix   Treatments in MAU included IV LR bolus, phenergan IV, pepcid IV, and MVI.  Reassessment after completion of medication in MAU. Patient reports feeling a lot better, PO challenge initiated and passed.  Discussed with patient home use of medication, Rx for zofran sent to pharmacy of choice as patient has difficulty keep diclegis down.  Discussed reasons to return to MAU. Follow up as scheduled in the office. Return to MAU as needed. Pt stable at time of discharge.   Assessment and Plan   1. Dehydration during pregnancy   2. Fever and chills   3. Nausea and vomiting in adult   4. [redacted] weeks gestation of pregnancy   5. Shortness of breath    Discharge home Follow up  as scheduled in the office for prenatal care Return to MAU as needed for reasons discussed and/or emergencies  Rx for zofran    Follow-up Information    [facility] Follow up.   Specialty: Obstetrics and Gynecology Why: Follow up as scheduled in the office for prenatal care Contact information: [address] [facility]  [place] [address] [phone]             Allergies as of 02/18/2020   No Known Allergies     Medication List    TAKE these medications   Doxylamine-Pyridoxine 10-10 MG Tbec Commonly known as: Diclegis Take 2 tablets by mouth at bedtime. If symptoms persist, add one tablet in the morning and one in the afternoon   escitalopram 10 MG tablet Commonly known as: LEXAPRO Take 10 mg by mouth daily.   famotidine 20 MG tablet Commonly known as: PEPCID Take 1 tablet by mouth once to twice daily for heartburn.   ondansetron 4 MG disintegrating tablet Commonly known as: Zofran ODT Take 1 tablet (4 mg total) by mouth every 8 (eight) hours as needed for nausea or vomiting.   pantoprazole 40 MG tablet Commonly known as: Protonix Take 1 tablet (40 mg total) by mouth daily.   PRENATAL VITAMINS PO Take by mouth.       Sharyon Cable CNM 02/18/2020, 6:19 AM

## 2020-02-17 NOTE — MAU Note (Signed)
.   Chelsea Ferguson is a 24 y.o. at [redacted]w[redacted]d here in MAU reporting: chill, body aches and vomiting for 2 days. Denies any VB or abnormal discharge  Onset of complaint: 2 days  Pain score: 6 Vitals:   02/17/20 1813  BP: (!) 121/58  Pulse: (!) 107  Resp: 16     FHT:166 Lab orders placed from triage: UA

## 2020-02-17 NOTE — ED Triage Notes (Signed)
Pt reports fever, nausea, vomiting, generalized body aches x2 days. Pt seen at [facility] and sent here to rule out covid.

## 2020-02-17 NOTE — MAU Note (Signed)
Report called to RN charge nurse and transport called to take pt to ED

## 2020-02-17 NOTE — MAU Provider Note (Signed)
Chief Complaint: Chills, Generalized Body Aches, and Emesis   None     SUBJECTIVE HPI: Chelsea Ferguson is a 24 y.o. G2P0101 @ [redacted] weeks gestation who presents to maternity admissions for Fever, chest pain, shortness of breath, cough, and N/V. Symptoms started in the last 24 hours. She has no pregnancy complaints.   Past Medical History:  Diagnosis Date  . Anxiety and depression   . Gestational hypertension w/o significant proteinuria in 3rd trimester   . Headache   . Hypertension    during pregnancy   . IBS (irritable bowel syndrome)   . Infertility associated with anovulation 07/28/2014   Pregnancy conceived with IVF    . Infertility, female   . Obesity affecting pregnancy, antepartum 07/28/2014  . PCOS (polycystic ovarian syndrome)   . Severe preeclampsia 02/09/2015  . Supervision of normal first pregnancy 07/28/2014    Clinic [place] Prenatal Labs  Dating  LMP c/w 6 week ultrasound Blood type: --/--/O POS (12/09 2142)  Genetic Screen 1 Screen: NT normal         AFP:  Not done           Antibody: Neg  Anatomic [place]  Normal  Rubella:  immune  GTT Third trimester: 134 RPR:   NR  TDaP vaccine 12/23/14 HBsAg:   Neg  Flu vaccine 07/14/14 HIV: NONREACTIVE (12/09 2142)   GBS  GBS: negative (01/26/15)  Contraception Undecided Pa  . Threatened preterm labor, antepartum 01/12/2015   Past Surgical History:  Procedure Laterality Date  . NO PAST SURGERIES     Social History   Socioeconomic History  . Marital status: Married    Spouse name: Fayrene Fearing  . Number of children: 1  . Years of education: 57  . Highest education level: Associate degree: occupational, Scientist, product/process development, or vocational program  Occupational History  . Not on file  Tobacco Use  . Smoking status: Never Smoker  . Smokeless tobacco: Never Used  Vaping Use  . Vaping Use: Never used  Substance and Sexual Activity  . Alcohol use: No  . Drug use: No  . Sexual activity: Yes    Birth control/protection: None  Other Topics Concern  .  Not on file  Social History Narrative  . Not on file   Social Determinants of Health   Financial Resource Strain:   . Difficulty of Paying Living Expenses:   Food Insecurity:   . Worried About Programme researcher, broadcasting/film/video in the Last Year:   . Barista in the Last Year:   Transportation Needs:   . Freight forwarder (Medical):   Marland Kitchen Lack of Transportation (Non-Medical):   Physical Activity:   . Days of Exercise per Week:   . Minutes of Exercise per Session:   Stress:   . Feeling of Stress :   Social Connections:   . Frequency of Communication with Friends and Family:   . Frequency of Social Gatherings with Friends and Family:   . Attends Religious Services:   . Active Member of Clubs or Organizations:   . Attends Banker Meetings:   Marland Kitchen Marital Status:   Intimate Partner Violence:   . Fear of Current or Ex-Partner:   . Emotionally Abused:   Marland Kitchen Physically Abused:   . Sexually Abused:    No current facility-administered medications on file prior to encounter.   Current Outpatient Medications on File Prior to Encounter  Medication Sig Dispense Refill  . Doxylamine-Pyridoxine (DICLEGIS) 10-10 MG TBEC Take 2  tablets by mouth at bedtime. If symptoms persist, add one tablet in the morning and one in the afternoon 100 tablet 5  . escitalopram (LEXAPRO) 10 MG tablet Take 10 mg by mouth daily.    . famotidine (PEPCID) 20 MG tablet Take 1 tablet by mouth once to twice daily for heartburn. 60 tablet 0  . pantoprazole (PROTONIX) 40 MG tablet Take 1 tablet (40 mg total) by mouth daily. 30 tablet 3  . Prenatal Vit-Fe Fumarate-FA (PRENATAL VITAMINS PO) Take by mouth.     No Known Allergies  ROS:  Review of Systems  I have reviewed patient's Past Medical Hx, Surgical Hx, Family Hx, Social Hx, medications and allergies.   Physical Exam   Patient Vitals for the past 24 hrs:  BP Temp Pulse Resp  02/17/20 1822 -- 98.9 F (37.2 C) -- --  02/17/20 1813 (!) 121/58 -- (!) 107  16   Physical Exam Constitutional:      Appearance: Normal appearance.  Neurological:     Mental Status: She is alert.    MDM Patient with no pregnancy complaints. She was agreeable to be evaluated for her fever, SOB and cough in the main ED.  Dr. Annia Friendly notified of patient's arrival.   ASSESSMENT MSE Complete  PLAN   Transfer to Main ED.   Duane Lope, NP 02/17/2020 6:55 PM

## 2020-02-18 LAB — COMPREHENSIVE METABOLIC PANEL
ALT: 8 U/L (ref 0–44)
AST: 17 U/L (ref 15–41)
Albumin: 3.1 g/dL — ABNORMAL LOW (ref 3.5–5.0)
Alkaline Phosphatase: 61 U/L (ref 38–126)
Anion gap: 10 (ref 5–15)
BUN: <5 mg/dL — ABNORMAL LOW (ref 6–20)
CO2: 22 mmol/L (ref 22–32)
Calcium: 8.9 mg/dL (ref 8.9–10.3)
Chloride: 102 mmol/L (ref 98–111)
Creatinine, Ser: 0.68 mg/dL (ref 0.44–1.00)
GFR calc Af Amer: >60 mL/min (ref >60)
GFR calc non Af Amer: >60 mL/min (ref >60)
Glucose, Bld: 117 mg/dL — ABNORMAL HIGH (ref 70–99)
Potassium: 3.6 mmol/L (ref 3.5–5.1)
Sodium: 134 mmol/L — ABNORMAL LOW (ref 135–145)
Total Bilirubin: 0.4 mg/dL (ref 0.3–1.2)
Total Protein: 6 g/dL — ABNORMAL LOW (ref 6.5–8.1)

## 2020-02-18 LAB — CBC
HCT: 34.7 % — ABNORMAL LOW (ref 36.0–46.0)
Hemoglobin: 11.9 g/dL — ABNORMAL LOW (ref 12.0–15.0)
MCH: 30 pg (ref 26.0–34.0)
MCHC: 34.3 g/dL (ref 30.0–36.0)
MCV: 87.4 fL (ref 80.0–100.0)
Platelets: 250 10*3/uL (ref 150–400)
RBC: 3.97 MIL/uL (ref 3.87–5.11)
RDW: 12.3 % (ref 11.5–15.5)
WBC: 7.9 10*3/uL (ref 4.0–10.5)
nRBC: 0 % (ref 0.0–0.2)

## 2020-02-18 MED ORDER — PROMETHAZINE HCL 25 MG/ML IJ SOLN
12.5000 mg | Freq: Once | INTRAMUSCULAR | Status: AC
  Administered 2020-02-18: 12.5 mg via INTRAVENOUS
  Filled 2020-02-18: qty 1

## 2020-02-18 MED ORDER — ONDANSETRON 4 MG PO TBDP
4.0000 mg | ORAL_TABLET | Freq: Three times a day (TID) | ORAL | 2 refills | Status: DC | PRN
Start: 2020-02-18 — End: 2020-05-18

## 2020-02-18 NOTE — MAU Note (Signed)
Pt being PO challenged.

## 2020-03-02 ENCOUNTER — Other Ambulatory Visit: Payer: Self-pay | Admitting: *Deleted

## 2020-03-02 ENCOUNTER — Other Ambulatory Visit: Payer: Self-pay | Admitting: Family Medicine

## 2020-03-02 ENCOUNTER — Ambulatory Visit: Payer: BC Managed Care – PPO | Attending: Obstetrics and Gynecology

## 2020-03-02 ENCOUNTER — Other Ambulatory Visit: Payer: Self-pay

## 2020-03-02 DIAGNOSIS — O09292 Supervision of pregnancy with other poor reproductive or obstetric history, second trimester: Secondary | ICD-10-CM

## 2020-03-02 DIAGNOSIS — Z363 Encounter for antenatal screening for malformations: Secondary | ICD-10-CM | POA: Diagnosis not present

## 2020-03-02 DIAGNOSIS — O09812 Supervision of pregnancy resulting from assisted reproductive technology, second trimester: Secondary | ICD-10-CM

## 2020-03-02 DIAGNOSIS — Z3480 Encounter for supervision of other normal pregnancy, unspecified trimester: Secondary | ICD-10-CM

## 2020-03-02 DIAGNOSIS — Z3A19 19 weeks gestation of pregnancy: Secondary | ICD-10-CM

## 2020-03-02 DIAGNOSIS — O99212 Obesity complicating pregnancy, second trimester: Secondary | ICD-10-CM

## 2020-03-02 DIAGNOSIS — O99210 Obesity complicating pregnancy, unspecified trimester: Secondary | ICD-10-CM

## 2020-03-02 DIAGNOSIS — E669 Obesity, unspecified: Secondary | ICD-10-CM

## 2020-03-04 ENCOUNTER — Ambulatory Visit: Admit: 2020-03-04 | Payer: BC Managed Care – PPO | Admitting: Neurosurgery

## 2020-03-04 SURGERY — CERVICAL ANTERIOR DISC ARTHROPLASTY
Anesthesia: General

## 2020-03-12 ENCOUNTER — Encounter: Payer: BC Managed Care – PPO | Admitting: Family Medicine

## 2020-03-19 ENCOUNTER — Other Ambulatory Visit: Payer: Self-pay

## 2020-03-19 ENCOUNTER — Ambulatory Visit (INDEPENDENT_AMBULATORY_CARE_PROVIDER_SITE_OTHER): Payer: BC Managed Care – PPO | Admitting: Family Medicine

## 2020-03-19 VITALS — BP 92/51 | HR 75

## 2020-03-19 DIAGNOSIS — K219 Gastro-esophageal reflux disease without esophagitis: Secondary | ICD-10-CM

## 2020-03-19 DIAGNOSIS — O09299 Supervision of pregnancy with other poor reproductive or obstetric history, unspecified trimester: Secondary | ICD-10-CM

## 2020-03-19 DIAGNOSIS — Z3A21 21 weeks gestation of pregnancy: Secondary | ICD-10-CM

## 2020-03-19 DIAGNOSIS — Z3480 Encounter for supervision of other normal pregnancy, unspecified trimester: Secondary | ICD-10-CM

## 2020-03-19 NOTE — Progress Notes (Signed)
   PRENATAL VISIT NOTE  Subjective:  Chelsea Ferguson is a 24 y.o. G2P0101 at [redacted]w[redacted]d being seen today for ongoing prenatal care.  She is currently monitored for the following issues for this high-risk pregnancy and has Obesity (BMI 30.0-34.9); Anxiety and depression; GERD (gastroesophageal reflux disease); Tingling; Supervision of other normal pregnancy, antepartum; History of pre-eclampsia in prior pregnancy, currently pregnant; Antepartum bleeding, first trimester; and History of postpartum hemorrhage, currently pregnant on their problem list.  Patient reports nausea.  Contractions: Not present. Vag. Bleeding: None.  Movement: Present. Denies leaking of fluid.   The following portions of the patient's history were reviewed and updated as appropriate: allergies, current medications, past family history, past medical history, past social history, past surgical history and problem list.   Objective:   Vitals:   03/19/20 1106  BP: (!) 92/51  Pulse: 75    Fetal Status:     Movement: Present     General:  Alert, oriented and cooperative. Patient is in no acute distress.  Skin: Skin is warm and dry. No rash noted.   Cardiovascular: Normal heart rate noted  Respiratory: Normal respiratory effort, no problems with respiration noted  Abdomen: Soft, gravid, appropriate for gestational age.  Pain/Pressure: Absent     Pelvic: Cervical exam deferred        Extremities: Normal range of motion.  Edema: None  Mental Status: Normal mood and affect. Normal behavior. Normal judgment and thought content.   Assessment and Plan:  Pregnancy: G2P0101 at [redacted]w[redacted]d  1. [redacted] weeks gestation of pregnancy  2. Supervision of other normal pregnancy, antepartum FHT and FH normal  3. History of pre-eclampsia in prior pregnancy, currently pregnant On ASA 81mg   4. Gastroesophageal reflux disease, unspecified whether esophagitis present   Preterm labor symptoms and general obstetric precautions including but not  limited to vaginal bleeding, contractions, leaking of fluid and fetal movement were reviewed in detail with the patient. Please refer to After Visit Summary for other counseling recommendations.   No follow-ups on file.  Future Appointments  Date Time Provider Department Center  03/30/2020 12:30 PM [facility] NURSE [facility]  03/30/2020 12:45 PM [facility] US5 [facility]    04/01/2020, DO

## 2020-03-30 ENCOUNTER — Other Ambulatory Visit: Payer: Self-pay

## 2020-03-30 ENCOUNTER — Other Ambulatory Visit: Payer: Self-pay | Admitting: *Deleted

## 2020-03-30 ENCOUNTER — Ambulatory Visit: Payer: BC Managed Care – PPO | Attending: Obstetrics and Gynecology

## 2020-03-30 ENCOUNTER — Ambulatory Visit: Payer: BC Managed Care – PPO | Admitting: *Deleted

## 2020-03-30 ENCOUNTER — Encounter: Payer: Self-pay | Admitting: *Deleted

## 2020-03-30 ENCOUNTER — Other Ambulatory Visit: Payer: Self-pay | Admitting: Family Medicine

## 2020-03-30 DIAGNOSIS — O09299 Supervision of pregnancy with other poor reproductive or obstetric history, unspecified trimester: Secondary | ICD-10-CM

## 2020-03-30 DIAGNOSIS — O209 Hemorrhage in early pregnancy, unspecified: Secondary | ICD-10-CM | POA: Diagnosis not present

## 2020-03-30 DIAGNOSIS — O09292 Supervision of pregnancy with other poor reproductive or obstetric history, second trimester: Secondary | ICD-10-CM | POA: Diagnosis not present

## 2020-03-30 DIAGNOSIS — Z3480 Encounter for supervision of other normal pregnancy, unspecified trimester: Secondary | ICD-10-CM

## 2020-03-30 DIAGNOSIS — O99212 Obesity complicating pregnancy, second trimester: Secondary | ICD-10-CM | POA: Diagnosis not present

## 2020-03-30 DIAGNOSIS — O09812 Supervision of pregnancy resulting from assisted reproductive technology, second trimester: Secondary | ICD-10-CM | POA: Diagnosis not present

## 2020-03-30 DIAGNOSIS — O99210 Obesity complicating pregnancy, unspecified trimester: Secondary | ICD-10-CM | POA: Diagnosis not present

## 2020-03-30 DIAGNOSIS — E669 Obesity, unspecified: Secondary | ICD-10-CM

## 2020-03-30 DIAGNOSIS — Z3A23 23 weeks gestation of pregnancy: Secondary | ICD-10-CM

## 2020-03-30 DIAGNOSIS — O43199 Other malformation of placenta, unspecified trimester: Secondary | ICD-10-CM

## 2020-03-30 DIAGNOSIS — Z363 Encounter for antenatal screening for malformations: Secondary | ICD-10-CM

## 2020-03-30 MED ORDER — PROMETHAZINE HCL 25 MG RE SUPP: 25.0000 mg | Freq: Four times a day (QID) | RECTAL | 0 refills | Status: DC | PRN

## 2020-04-07 ENCOUNTER — Other Ambulatory Visit: Payer: Self-pay | Admitting: Family Medicine

## 2020-04-09 ENCOUNTER — Other Ambulatory Visit: Payer: Self-pay | Admitting: Family Medicine

## 2020-04-09 MED ORDER — PROMETHAZINE HCL 25 MG RE SUPP: 25.0000 mg | Freq: Four times a day (QID) | RECTAL | 6 refills | Status: DC | PRN

## 2020-04-15 ENCOUNTER — Other Ambulatory Visit (HOSPITAL_COMMUNITY): Payer: Self-pay | Admitting: Nurse Practitioner

## 2020-04-15 ENCOUNTER — Encounter (HOSPITAL_COMMUNITY): Payer: Self-pay | Admitting: Obstetrics & Gynecology

## 2020-04-15 ENCOUNTER — Encounter: Payer: Self-pay | Admitting: Nurse Practitioner

## 2020-04-15 ENCOUNTER — Inpatient Hospital Stay (HOSPITAL_COMMUNITY)
Admission: AD | Admit: 2020-04-15 | Discharge: 2020-04-15 | Disposition: A | Discharge status: Home / Self Care | Payer: BC Managed Care – PPO | Attending: Obstetrics & Gynecology | Admitting: Obstetrics & Gynecology

## 2020-04-15 DIAGNOSIS — E669 Obesity, unspecified: Secondary | ICD-10-CM | POA: Diagnosis not present

## 2020-04-15 DIAGNOSIS — F329 Major depressive disorder, single episode, unspecified: Secondary | ICD-10-CM | POA: Insufficient documentation

## 2020-04-15 DIAGNOSIS — Z3A25 25 weeks gestation of pregnancy: Secondary | ICD-10-CM | POA: Diagnosis not present

## 2020-04-15 DIAGNOSIS — U071 COVID-19: Secondary | ICD-10-CM

## 2020-04-15 DIAGNOSIS — O99212 Obesity complicating pregnancy, second trimester: Secondary | ICD-10-CM | POA: Insufficient documentation

## 2020-04-15 DIAGNOSIS — Z79899 Other long term (current) drug therapy: Secondary | ICD-10-CM | POA: Insufficient documentation

## 2020-04-15 DIAGNOSIS — R197 Diarrhea, unspecified: Secondary | ICD-10-CM

## 2020-04-15 DIAGNOSIS — R112 Nausea with vomiting, unspecified: Secondary | ICD-10-CM | POA: Diagnosis not present

## 2020-04-15 DIAGNOSIS — O98512 Other viral diseases complicating pregnancy, second trimester: Secondary | ICD-10-CM

## 2020-04-15 DIAGNOSIS — Z8632 Personal history of gestational diabetes: Secondary | ICD-10-CM | POA: Diagnosis not present

## 2020-04-15 DIAGNOSIS — O99891 Other specified diseases and conditions complicating pregnancy: Secondary | ICD-10-CM | POA: Diagnosis not present

## 2020-04-15 DIAGNOSIS — F419 Anxiety disorder, unspecified: Secondary | ICD-10-CM | POA: Insufficient documentation

## 2020-04-15 DIAGNOSIS — Z8759 Personal history of other complications of pregnancy, childbirth and the puerperium: Secondary | ICD-10-CM | POA: Diagnosis not present

## 2020-04-15 DIAGNOSIS — O99342 Other mental disorders complicating pregnancy, second trimester: Secondary | ICD-10-CM | POA: Insufficient documentation

## 2020-04-15 LAB — URINALYSIS, ROUTINE W REFLEX MICROSCOPIC
Bilirubin Urine: NEGATIVE
Glucose, UA: NEGATIVE mg/dL
Hgb urine dipstick: NEGATIVE
Ketones, ur: NEGATIVE mg/dL
Leukocytes,Ua: NEGATIVE
Nitrite: NEGATIVE
Protein, ur: NEGATIVE mg/dL
Specific Gravity, Urine: 1.01 (ref 1.005–1.030)
pH: 7 (ref 5.0–8.0)

## 2020-04-15 MED ORDER — ONDANSETRON 4 MG PO TBDP
8.0000 mg | ORAL_TABLET | Freq: Once | ORAL | Status: AC
  Administered 2020-04-15: 8 mg via ORAL
  Filled 2020-04-15: qty 2

## 2020-04-15 NOTE — Progress Notes (Signed)
I connected by phone with [name] on 04/15/2020 at 5:19 PM to discuss the potential use of an new treatment for mild to moderate COVID-19 viral infection in non-hospitalized patients.  This patient is a 24 y.o. female that meets the FDA criteria for Emergency Use Authorization of casirivimab\imdevimab.  Has a (+) direct SARS-CoV-2 viral test result  Has mild or moderate COVID-19   Is ? 26 years of age and weighs ? 40 kg  Is NOT hospitalized due to COVID-19  Is NOT requiring oxygen therapy or requiring an increase in baseline oxygen flow rate due to COVID-19  Is within 10 days of symptom onset  Has at least one of the high risk factor(s) for progression to severe COVID-19 and/or hospitalization as defined in EUA.  Specific high risk criteria : Pregnancy   Today is day 7 of symptoms. Patient is unvaccinated.   I have spoken and communicated the following to the patient or parent/caregiver:  1. FDA has authorized the emergency use of casirivimab\imdevimab for the treatment of mild to moderate COVID-19 in adults and pediatric patients with positive results of direct SARS-CoV-2 viral testing who are 83 years of age and older weighing at least 40 kg, and who are at high risk for progressing to severe COVID-19 and/or hospitalization.  2. The significant known and potential risks and benefits of casirivimab\imdevimab, and the extent to which such potential risks and benefits are unknown.  3. Information on available alternative treatments and the risks and benefits of those alternatives, including clinical trials.  4. Patients treated with casirivimab\imdevimab should continue to self-isolate and use infection control measures (e.g., wear mask, isolate, social distance, avoid sharing personal items, clean and disinfect "high touch" surfaces, and frequent handwashing) according to CDC guidelines.   5. The patient or parent/caregiver has the option to accept or refuse  casirivimab\imdevimab .  After reviewing this information with the patient, The patient agreed to proceed with receiving casirivimab\imdevimab infusion and will be provided a copy of the Fact sheet prior to receiving the infusion.[name], DNP, AGNP-C [phone] (Infusion Center Hotline)

## 2020-04-15 NOTE — MAU Note (Signed)
Pt presents to MAU with c/o fatigue and nausea and vomiting that started on Thursday. She tested positive for covid on Saturday. She denies fever, VB, LOF and pain. +FM

## 2020-04-15 NOTE — Discharge Instructions (Signed)
Nausea and Vomiting, Adult Nausea is feeling sick to your stomach or feeling that you are about to throw up (vomit). Vomiting is when food in your stomach is thrown up and out of the mouth. Throwing up can make you feel weak. It can also make you lose too much water in your body (get dehydrated). If you lose too much water in your body, you may:  Feel tired.  Feel thirsty.  Have a dry mouth.  Have cracked lips.  Go pee (urinate) less often. Older adults and people with other diseases or a weak body defense system (immune system) are at higher risk for losing too much water in the body. If you feel sick to your stomach and you throw up, it is important to follow instructions from your doctor about how to take care of yourself. Follow these instructions at home: Watch your symptoms for any changes. Tell your doctor about them. Follow these instructions to care for yourself at home. Eating and drinking      Take an ORS (oral rehydration solution). This is a drink that is sold at pharmacies and stores.  Drink clear fluids in small amounts as you are able, such as: ? Water. ? Ice chips. ? Fruit juice that has water added (diluted fruit juice). ? Low-calorie sports drinks.  Eat bland, easy-to-digest foods in small amounts as you are able, such as: ? Bananas. ? Applesauce. ? Rice. ? Low-fat (lean) meats. ? Toast. ? Crackers.  Avoid drinking fluids that have a lot of sugar or caffeine in them. This includes energy drinks, sports drinks, and soda.  Avoid alcohol.  Avoid spicy or fatty foods. General instructions  Take over-the-counter and prescription medicines only as told by your doctor.  Drink enough fluid to keep your pee (urine) pale yellow.  Wash your hands often with soap and water. If you cannot use soap and water, use hand sanitizer.  Make sure that all people in your home wash their hands well and often.  Rest at home while you get better.  Watch your condition  for any changes.  Take slow and deep breaths when you feel sick to your stomach.  Keep all follow-up visits as told by your doctor. This is important. Contact a doctor if:  Your symptoms get worse.  You have new symptoms.  You have a fever.  You cannot drink fluids without throwing up.  You feel sick to your stomach for more than 2 days.  You feel light-headed or dizzy.  You have a headache.  You have muscle cramps.  You have a rash.  You have pain while peeing. Get help right away if:  You have pain in your chest, neck, arm, or jaw.  You feel very weak or you pass out (faint).  You throw up again and again.  You have throw up that is bright red or looks like black coffee grounds.  You have bloody or black poop (stools) or poop that looks like tar.  You have a very bad headache, a stiff neck, or both.  You have very bad pain, cramping, or bloating in your belly (abdomen).  You have trouble breathing.  You are breathing very quickly.  Your heart is beating very quickly.  Your skin feels cold and clammy.  You feel confused.  You have signs of losing too much water in your body, such as: ? Dark pee, very little pee, or no pee. ? Cracked lips. ? Dry mouth. ? Sunken eyes. ?   Sleepiness. ? Weakness. These symptoms may be an emergency. Do not wait to see if the symptoms will go away. Get medical help right away. Call your local emergency services (911 in the [place] Do not drive yourself to the hospital. Summary  Nausea is feeling sick to your stomach or feeling that you are about to throw up (vomit). Vomiting is when food in your stomach is thrown up and out of the mouth.  Follow instructions from your doctor about eating and drinking to keep from losing too much water in your body.  Take over-the-counter and prescription medicines only as told by your doctor.  Contact your doctor if your symptoms get worse or you have new symptoms.  Keep all follow-up  visits as told by your doctor. This is important. This information is not intended to replace advice given to you by your health care provider. Make sure you discuss any questions you have with your health care provider. Document Revised: 11/16/2018 Document Reviewed: 01/02/2018 Elsevier Patient Education  2020 [employer] Inc.  

## 2020-04-15 NOTE — MAU Provider Note (Signed)
History     CSN: [id number]  Arrival date and time: 04/15/20 1356   First Provider Initiated Contact with Patient 04/15/20 1501      Chief Complaint  Patient presents with  . Emesis  . Fatigue   24 y.o. G2P0101 @25 .5 wks presenting with N/V/D. Sx started about 5 days ago. She tested positive for Covid 6 days ago. Denies SOB and CP. Has nonproductive cough. Reports 2 episodes of watery diarrhea yesterday and 3 today. Has vomited after eating a pop tart today. Has been able to keep some water down. Reports decreased appetite. Denies VB, LOF, and ctx. +FM. Had a fever 5 nights ago, none since.   OB History    Gravida  2   Para  1   Term      Preterm  1   AB      Living  1     SAB      TAB      Ectopic      Multiple  0   Live Births  1           Past Medical History:  Diagnosis Date  . Anxiety and depression   . Gestational hypertension w/o significant proteinuria in 3rd trimester   . Headache   . Hypertension    during pregnancy   . IBS (irritable bowel syndrome)   . Infertility associated with anovulation 07/28/2014   Pregnancy conceived with IVF    . Infertility, female   . Obesity affecting pregnancy, antepartum 07/28/2014  . PCOS (polycystic ovarian syndrome)   . Severe preeclampsia 02/09/2015  . Supervision of normal first pregnancy 07/28/2014    [facility] Prenatal Labs  Dating  LMP c/w 6 week ultrasound Blood type: --/--/O POS (12/09 2142)  Genetic Screen 1 Screen: NT normal         AFP:  Not done           Antibody: Neg  Anatomic 2143  Normal  Rubella:  immune  GTT Third trimester: 134 RPR:   NR  TDaP vaccine 12/23/14 HBsAg:   Neg  Flu vaccine 07/14/14 HIV: NONREACTIVE (12/09 2142)   GBS  GBS: negative (01/26/15)  Contraception Undecided Pa  . Threatened preterm labor, antepartum 01/12/2015    Past Surgical History:  Procedure Laterality Date  . NO PAST SURGERIES      Family History  Problem Relation Age of Onset  . Diabetes Maternal Grandmother   .  Hypertension Maternal Grandmother   . Hypertension Paternal Grandmother   . Cancer Neg Hx     Social History   Tobacco Use  . Smoking status: Never Smoker  . Smokeless tobacco: Never Used  Vaping Use  . Vaping Use: Never used  Substance Use Topics  . Alcohol use: No  . Drug use: No    Allergies: No Known Allergies  Medications Prior to Admission  Medication Sig Dispense Refill Last Dose  . escitalopram (LEXAPRO) 10 MG tablet Take 10 mg by mouth daily.   Past Week at Unknown time  . pantoprazole (PROTONIX) 40 MG tablet Take 1 tablet (40 mg total) by mouth daily. 30 tablet 3 04/14/2020 at Unknown time  . Prenatal Vit-Fe Fumarate-FA (PRENATAL VITAMINS PO) Take by mouth.   Past Week at Unknown time  . promethazine (PHENERGAN) 25 MG suppository Place 1 suppository (25 mg total) rectally every 6 (six) hours as needed for nausea or vomiting. 60 each 6 Past Week at Unknown time  . Doxylamine-Pyridoxine (DICLEGIS)  10-10 MG TBEC Take 2 tablets by mouth at bedtime. If symptoms persist, add one tablet in the morning and one in the afternoon (Patient not taking: Reported on 03/19/2020) 100 tablet 5   . famotidine (PEPCID) 20 MG tablet Take 1 tablet by mouth once to twice daily for heartburn. 60 tablet 0   . ondansetron (ZOFRAN ODT) 4 MG disintegrating tablet Take 1 tablet (4 mg total) by mouth every 8 (eight) hours as needed for nausea or vomiting. 30 tablet 2     Review of Systems  Constitutional: Negative for chills and fever.  Gastrointestinal: Positive for diarrhea, nausea and vomiting. Negative for abdominal pain and constipation.  Genitourinary: Negative for vaginal bleeding and vaginal discharge.   Physical Exam   Blood pressure 108/67, pulse (!) 102, temperature 98.1 F (36.7 C), resp. rate 18, last menstrual period 10/17/2019, SpO2 99 %.  Physical Exam Vitals and nursing note reviewed.  Constitutional:      General: She is not in acute distress.    Appearance: Normal appearance.   HENT:     Head: Normocephalic and atraumatic.  Pulmonary:     Effort: Pulmonary effort is normal. No respiratory distress.  Musculoskeletal:     Cervical back: Normal range of motion.  Neurological:     General: No focal deficit present.     Mental Status: She is alert and oriented to person, place, and time.  Psychiatric:        Mood and Affect: Mood normal.   EFM: 150 bpm, mod variability, no accels, no decels Toco: none  Results for orders placed or performed during the hospital encounter of 04/15/20 (from the past 24 hour(s))  Urinalysis, Routine w reflex microscopic Urine, Clean Catch     Status: Abnormal   Collection Time: 04/15/20  2:50 PM  Result Value Ref Range   Color, Urine YELLOW YELLOW   APPearance HAZY (A) CLEAR   Specific Gravity, Urine 1.010 1.005 - 1.030   pH 7.0 5.0 - 8.0   Glucose, UA NEGATIVE NEGATIVE mg/dL   Hgb urine dipstick NEGATIVE NEGATIVE   Bilirubin Urine NEGATIVE NEGATIVE   Ketones, ur NEGATIVE NEGATIVE mg/dL   Protein, ur NEGATIVE NEGATIVE mg/dL   Nitrite NEGATIVE NEGATIVE   Leukocytes,Ua NEGATIVE NEGATIVE   MAU Course  Procedures  MDM Labs ordered and reviewed. No signs of dehydration. Zofran ODT given. Tolerating po fluids. No emesis. Stable for discharge home. Will arrange MAB infusion if meets criteria and is deemed appropriate, pt agrees with plan.   Assessment and Plan   1. [redacted] weeks gestation of pregnancy   2. COVID-19 affecting pregnancy in second trimester   3. Nausea vomiting and diarrhea    Discharge home Follow up at [facility] as scheduled Return precautions Phenergan/Zofran prn Maintain hydration  Allergies as of 04/15/2020   No Known Allergies     Medication List    TAKE these medications   Doxylamine-Pyridoxine 10-10 MG Tbec Commonly known as: Diclegis Take 2 tablets by mouth at bedtime. If symptoms persist, add one tablet in the morning and one in the afternoon   escitalopram 10 MG tablet Commonly known as:  LEXAPRO Take 10 mg by mouth daily.   famotidine 20 MG tablet Commonly known as: PEPCID Take 1 tablet by mouth once to twice daily for heartburn.   ondansetron 4 MG disintegrating tablet Commonly known as: Zofran ODT Take 1 tablet (4 mg total) by mouth every 8 (eight) hours as needed for nausea or vomiting.   pantoprazole  40 MG tablet Commonly known as: Protonix Take 1 tablet (40 mg total) by mouth daily.   PRENATAL VITAMINS PO Take by mouth.   promethazine 25 MG suppository Commonly known as: Phenergan Place 1 suppository (25 mg total) rectally every 6 (six) hours as needed for nausea or vomiting.      Donette Larry, CNM 04/15/2020, 3:32 PM

## 2020-04-16 ENCOUNTER — Ambulatory Visit (HOSPITAL_COMMUNITY)
Admission: RE | Admit: 2020-04-16 | Discharge: 2020-04-16 | Disposition: A | Discharge status: Home / Self Care | Payer: BC Managed Care – PPO | Source: Ambulatory Visit | Attending: Pulmonary Disease | Admitting: Pulmonary Disease

## 2020-04-16 DIAGNOSIS — U071 COVID-19: Secondary | ICD-10-CM | POA: Diagnosis not present

## 2020-04-16 MED ORDER — EPINEPHRINE 0.3 MG/0.3ML IJ SOAJ: 0.3000 mg | Freq: Once | INTRAMUSCULAR | Status: DC | PRN

## 2020-04-16 MED ORDER — FAMOTIDINE IN NACL 20-0.9 MG/50ML-% IV SOLN: 20.0000 mg | Freq: Once | INTRAVENOUS | Status: DC | PRN

## 2020-04-16 MED ORDER — METHYLPREDNISOLONE SODIUM SUCC 125 MG IJ SOLR: 125.0000 mg | Freq: Once | INTRAMUSCULAR | Status: DC | PRN

## 2020-04-16 MED ORDER — ALBUTEROL SULFATE HFA 108 (90 BASE) MCG/ACT IN AERS: 2.0000 | INHALATION_SPRAY | Freq: Once | RESPIRATORY_TRACT | Status: DC | PRN

## 2020-04-16 MED ORDER — SODIUM CHLORIDE 0.9 % IV SOLN
1200.0000 mg | Freq: Once | INTRAVENOUS | Status: AC
  Administered 2020-04-16: 1200 mg via INTRAVENOUS
  Filled 2020-04-16: qty 10

## 2020-04-16 MED ORDER — DIPHENHYDRAMINE HCL 50 MG/ML IJ SOLN: 50.0000 mg | Freq: Once | INTRAMUSCULAR | Status: DC | PRN

## 2020-04-16 MED ORDER — SODIUM CHLORIDE 0.9 % IV SOLN: INTRAVENOUS | Status: DC | PRN

## 2020-04-16 NOTE — Discharge Instructions (Signed)

## 2020-04-16 NOTE — Progress Notes (Signed)
  Diagnosis: COVID-19  Physician: Dr. Patrick Wright  Procedure: Covid Infusion Clinic Med: casirivimab\imdevimab infusion - Provided patient with casirivimab\imdevimab fact sheet for patients, parents and caregivers prior to infusion.  Complications: No immediate complications noted.  Discharge: Discharged home   Ally Yow 04/16/2020   

## 2020-04-20 ENCOUNTER — Telehealth (INDEPENDENT_AMBULATORY_CARE_PROVIDER_SITE_OTHER): Payer: BC Managed Care – PPO | Admitting: Family Medicine

## 2020-04-20 ENCOUNTER — Other Ambulatory Visit: Payer: Self-pay

## 2020-04-20 DIAGNOSIS — O09292 Supervision of pregnancy with other poor reproductive or obstetric history, second trimester: Secondary | ICD-10-CM

## 2020-04-20 DIAGNOSIS — Z3480 Encounter for supervision of other normal pregnancy, unspecified trimester: Secondary | ICD-10-CM

## 2020-04-20 DIAGNOSIS — O09299 Supervision of pregnancy with other poor reproductive or obstetric history, unspecified trimester: Secondary | ICD-10-CM

## 2020-04-20 DIAGNOSIS — Z3A26 26 weeks gestation of pregnancy: Secondary | ICD-10-CM

## 2020-04-20 DIAGNOSIS — U071 COVID-19: Secondary | ICD-10-CM

## 2020-04-20 DIAGNOSIS — O98512 Other viral diseases complicating pregnancy, second trimester: Secondary | ICD-10-CM

## 2020-04-20 NOTE — Progress Notes (Signed)
OBSTETRICS PRENATAL VIRTUAL VISIT ENCOUNTER NOTE  Provider location: [facility]   I connected with [name] on 04/20/20 at 10:00 AM EDT by MyChart Video Encounter at home and verified that I am speaking with the correct person using two identifiers.   I discussed the limitations, risks, security and privacy concerns of performing an evaluation and management service virtually and the availability of in person appointments. I also discussed with the patient that there may be a patient responsible charge related to this service. The patient expressed understanding and agreed to proceed. Subjective:  Chelsea Ferguson is a 24 y.o. G2P0101 at [redacted]w[redacted]d being seen today for ongoing prenatal care.  She is currently monitored for the following issues for this high-risk pregnancy and has Obesity (BMI 30.0-34.9); Anxiety and depression; GERD (gastroesophageal reflux disease); Tingling; Supervision of other normal pregnancy, antepartum; History of pre-eclampsia in prior pregnancy, currently pregnant; Antepartum bleeding, first trimester; and History of postpartum hemorrhage, currently pregnant on their problem list.  Patient reports no complaints.  Contractions: Not present. Vag. Bleeding: None.  Movement: Present. Denies any leaking of fluid.   The following portions of the patient's history were reviewed and updated as appropriate: allergies, current medications, past family history, past medical history, past social history, past surgical history and problem list.   Objective:  There were no vitals filed for this visit.  Fetal Status:     Movement: Present     General:  Alert, oriented and cooperative. Patient is in no acute distress.  Respiratory: Normal respiratory effort, no problems with respiration noted  Mental Status: Normal mood and affect. Normal behavior. Normal judgment and thought content.  Rest of physical exam deferred due to type of  encounter  Imaging: [place] MFM OB FOLLOW UP  Result Date: 03/30/2020 ----------------------------------------------------------------------  OBSTETRICS REPORT                       (Signed Final 03/30/2020 03:04 pm) ---------------------------------------------------------------------- Patient Info  ID #:       [id number]                          D.O.B.:  10-23-1995 (24 yrs)  Name:       [name]              Visit Date: 03/30/2020 12:54 pm ---------------------------------------------------------------------- Performed By  Attending:        Ma Rings MD         Ref. Address:     [address]                                                             [address]                                                             [place]  [id number]  Performed By:     Eden Lathe BS      [facility]  Referred By:      Levie Heritage                    MD ---------------------------------------------------------------------- Orders  #  Description                           Code        Ordered By  1  [place] MFM OB FOLLOW UP                   [id number]    Noralee Space ----------------------------------------------------------------------  #  Order #                     Accession #                Episode #  1  [id number]                   [id number]                 [id number] ---------------------------------------------------------------------- Indications  Obesity complicating pregnancy, second         O99.212  trimester  Pregnancy resulting from assisted              O09.819  reproductive technology (IVF)  [redacted] weeks gestation of pregnancy                Z3A.23  Encounter for antenatal screening for          Z36.3  malformations   Prior poor obstetrical history antepartum,     O09.292  second trimester (postpartum hemorrhage)  Poor obstetric history: Previous               O09.299  preeclampsia / eclampsia/gestational HTN ---------------------------------------------------------------------- Fetal Evaluation  Num Of Fetuses:         1  Cardiac Activity:       Observed  Presentation:           Variable  Placenta:               Left lateral  Amniotic Fluid  AFI FV:      Within normal limits  Largest Pocket(cm)                              7.2 ---------------------------------------------------------------------- Biometry  BPD:      61.2  mm     G. Age:  24w 6d         90  %    CI:        80.73   %    70 - 86                                                          FL/HC:      17.1   %    19.2 - 20.8  HC:      215.1  mm     G. Age:  23w 4d         39  %    HC/AC:      1.14        1.05 - 1.21  AC:      189.5  mm     G. Age:  23w 5d         50  %    FL/BPD:     60.0   %    71 - 87  FL:       36.7  mm     G. Age:  21w 5d        3.1  %    FL/AC:      19.4   %    20 - 24  HUM:      35.2  mm     G. Age:  22w 1d         14  %  Est. FW:     552  gm      1 lb 3 oz     23  % ---------------------------------------------------------------------- OB History  Blood Type:   O+  Gravidity:    2         Term:   0        Prem:   1        SAB:   0  TOP:          0       Ectopic:  0        Living: 1 ---------------------------------------------------------------------- Gestational Age  LMP:           23w 4d        Date:  10/17/19                 EDD:   07/23/20  U/S Today:     23w 3d                                        EDD:   07/24/20  Best:          23w 3d     Det. ByMurtis Sink:  Embryo Transfer          EDD:   07/24/20 ---------------------------------------------------------------------- Anatomy  Cranium:               Appears normal  Aortic Arch:            Not well visualized  Cavum:                 Appears normal         Ductal  Arch:            Not well visualized  Ventricles:            Appears normal         Diaphragm:              Appears normal  Choroid Plexus:        Appears normal         Stomach:                Appears normal, left                                                                        sided  Cerebellum:            Appears normal         Abdomen:                Appears normal  Posterior Fossa:       Appears normal         Abdominal Wall:         Appears nml (cord                                                                        insert, abd wall)  Nuchal Fold:           Previously seen        Cord Vessels:           Appears normal (3                                                                        vessel cord)  Face:                  Appears normal         Kidneys:                Appear normal                         (orbits and profile)  Lips:                  Appears normal         Bladder:                Appears normal  Thoracic:  Appears normal         Spine:                  Appears normal  Heart:                 Previously seen        Upper Extremities:      Previously seen  RVOT:                  Previously seen        Lower Extremities:      Previously seen  LVOT:                  Appears normal  Other:  Fetus appears to be a female. Heels previously visualized. Open          hands/5th digits visualized. Nasal bone visualized. Technically difficult          due to maternal habitus and fetal position. ---------------------------------------------------------------------- Cervix Uterus Adnexa  Cervix  Length:           3.11  cm.  Normal appearance by transabdominal scan.  Uterus  No abnormality visualized.  Right Ovary  Within normal limits.  Left Ovary  Within normal limits.  Cul De Sac  No free fluid seen.  Adnexa  No abnormality visualized. ---------------------------------------------------------------------- Comments  This patient was seen for a follow up growth scan due to an  IVF  pregnancy and maternal obesity.  She denies any  problems since her last exam.  She was informed that the fetal growth and amniotic fluid  level appears appropriate for her gestational age.  Due to the IVF pregnancy, she was referred for a fetal  echocardiogram with [facility].  Due to a possible marginal placental cord insertion, a follow-  up growth scan was scheduled in 6 weeks. ----------------------------------------------------------------------                   Ma Rings, MD Electronically Signed Final Report   03/30/2020 03:04 pm ----------------------------------------------------------------------   Assessment and Plan:  Pregnancy: G2P0101 at [redacted]w[redacted]d 1. [redacted] weeks gestation of pregnancy 2. Supervision of other normal pregnancy, antepartum FHT and FH normal  3. History of pre-eclampsia in prior pregnancy, currently pregnant On ASA 81mg   4. History of postpartum hemorrhage, currently pregnant Recommend TXA at delivery  5. COVID-19 affecting pregnancy in second trimester S/p MABs infusion. Feeling much better. Ends isolation this Friday.   Preterm labor symptoms and general obstetric precautions including but not limited to vaginal bleeding, contractions, leaking of fluid and fetal movement were reviewed in detail with the patient. I discussed the assessment and treatment plan with the patient. The patient was provided an opportunity to ask questions and all were answered. The patient agreed with the plan and demonstrated an understanding of the instructions. The patient was advised to call back or seek an in-person office evaluation/go to MAU at [facility] for any urgent or concerning symptoms. Please refer to After Visit Summary for other counseling recommendations.   I provided 13 minutes of face-to-face time during this encounter.  No follow-ups on file.  Future Appointments  Date Time Provider Department Center  05/13/2020  2:30 PM [facility] NURSE  [facility]  05/13/2020  2:45 PM [facility] US5 [facility]    07/13/2020, DO [facility], [facility]

## 2020-04-20 NOTE — Progress Notes (Signed)
Patient agrees to virtual visit. Patient states that she has tested positive for covid and "just feels like a head cold". Armandina Stammer RN

## 2020-05-11 ENCOUNTER — Ambulatory Visit: Payer: BC Managed Care – PPO

## 2020-05-13 ENCOUNTER — Ambulatory Visit: Payer: BC Managed Care – PPO | Admitting: *Deleted

## 2020-05-13 ENCOUNTER — Ambulatory Visit (HOSPITAL_BASED_OUTPATIENT_CLINIC_OR_DEPARTMENT_OTHER): Payer: BC Managed Care – PPO

## 2020-05-13 ENCOUNTER — Other Ambulatory Visit: Payer: Self-pay

## 2020-05-13 DIAGNOSIS — Q256 Stenosis of pulmonary artery: Secondary | ICD-10-CM | POA: Diagnosis not present

## 2020-05-13 DIAGNOSIS — O43199 Other malformation of placenta, unspecified trimester: Secondary | ICD-10-CM

## 2020-05-13 DIAGNOSIS — J810 Acute pulmonary edema: Secondary | ICD-10-CM | POA: Diagnosis not present

## 2020-05-13 DIAGNOSIS — K219 Gastro-esophageal reflux disease without esophagitis: Secondary | ICD-10-CM | POA: Diagnosis not present

## 2020-05-13 DIAGNOSIS — O09293 Supervision of pregnancy with other poor reproductive or obstetric history, third trimester: Secondary | ICD-10-CM

## 2020-05-13 DIAGNOSIS — O209 Hemorrhage in early pregnancy, unspecified: Secondary | ICD-10-CM | POA: Insufficient documentation

## 2020-05-13 DIAGNOSIS — Z23 Encounter for immunization: Secondary | ICD-10-CM | POA: Diagnosis not present

## 2020-05-13 DIAGNOSIS — Z3480 Encounter for supervision of other normal pregnancy, unspecified trimester: Secondary | ICD-10-CM | POA: Insufficient documentation

## 2020-05-13 DIAGNOSIS — O99213 Obesity complicating pregnancy, third trimester: Secondary | ICD-10-CM

## 2020-05-13 DIAGNOSIS — O43193 Other malformation of placenta, third trimester: Secondary | ICD-10-CM | POA: Diagnosis not present

## 2020-05-13 DIAGNOSIS — Z3A29 29 weeks gestation of pregnancy: Secondary | ICD-10-CM

## 2020-05-13 DIAGNOSIS — O43899 Other placental disorders, unspecified trimester: Secondary | ICD-10-CM | POA: Diagnosis not present

## 2020-05-13 DIAGNOSIS — Z051 Observation and evaluation of newborn for suspected infectious condition ruled out: Secondary | ICD-10-CM | POA: Diagnosis not present

## 2020-05-13 DIAGNOSIS — O134 Gestational [pregnancy-induced] hypertension without significant proteinuria, complicating childbirth: Secondary | ICD-10-CM | POA: Diagnosis not present

## 2020-05-13 DIAGNOSIS — O09813 Supervision of pregnancy resulting from assisted reproductive technology, third trimester: Secondary | ICD-10-CM

## 2020-05-13 DIAGNOSIS — O4693 Antepartum hemorrhage, unspecified, third trimester: Secondary | ICD-10-CM | POA: Diagnosis not present

## 2020-05-13 DIAGNOSIS — Z363 Encounter for antenatal screening for malformations: Secondary | ICD-10-CM

## 2020-05-13 DIAGNOSIS — E669 Obesity, unspecified: Secondary | ICD-10-CM | POA: Diagnosis not present

## 2020-05-13 DIAGNOSIS — R1312 Dysphagia, oropharyngeal phase: Secondary | ICD-10-CM | POA: Diagnosis not present

## 2020-05-13 DIAGNOSIS — Z8616 Personal history of COVID-19: Secondary | ICD-10-CM | POA: Diagnosis not present

## 2020-05-13 DIAGNOSIS — O09299 Supervision of pregnancy with other poor reproductive or obstetric history, unspecified trimester: Secondary | ICD-10-CM

## 2020-05-13 DIAGNOSIS — O9942 Diseases of the circulatory system complicating childbirth: Secondary | ICD-10-CM | POA: Diagnosis not present

## 2020-05-13 DIAGNOSIS — Z3A00 Weeks of gestation of pregnancy not specified: Secondary | ICD-10-CM | POA: Diagnosis not present

## 2020-05-13 DIAGNOSIS — Z3A30 30 weeks gestation of pregnancy: Secondary | ICD-10-CM | POA: Diagnosis not present

## 2020-05-13 DIAGNOSIS — R011 Cardiac murmur, unspecified: Secondary | ICD-10-CM | POA: Diagnosis not present

## 2020-05-13 DIAGNOSIS — R000 Tachycardia, unspecified: Secondary | ICD-10-CM | POA: Diagnosis not present

## 2020-05-14 ENCOUNTER — Other Ambulatory Visit: Payer: Self-pay | Admitting: *Deleted

## 2020-05-14 ENCOUNTER — Ambulatory Visit (INDEPENDENT_AMBULATORY_CARE_PROVIDER_SITE_OTHER): Payer: BC Managed Care – PPO | Admitting: Family Medicine

## 2020-05-14 VITALS — BP 140/64 | HR 84 | Wt 176.0 lb

## 2020-05-14 DIAGNOSIS — U071 COVID-19: Secondary | ICD-10-CM

## 2020-05-14 DIAGNOSIS — O09299 Supervision of pregnancy with other poor reproductive or obstetric history, unspecified trimester: Secondary | ICD-10-CM

## 2020-05-14 DIAGNOSIS — F419 Anxiety disorder, unspecified: Secondary | ICD-10-CM

## 2020-05-14 DIAGNOSIS — Z23 Encounter for immunization: Secondary | ICD-10-CM | POA: Diagnosis not present

## 2020-05-14 DIAGNOSIS — F32A Depression, unspecified: Secondary | ICD-10-CM

## 2020-05-14 DIAGNOSIS — O43199 Other malformation of placenta, unspecified trimester: Secondary | ICD-10-CM

## 2020-05-14 DIAGNOSIS — Z3480 Encounter for supervision of other normal pregnancy, unspecified trimester: Secondary | ICD-10-CM

## 2020-05-14 DIAGNOSIS — O98512 Other viral diseases complicating pregnancy, second trimester: Secondary | ICD-10-CM

## 2020-05-14 DIAGNOSIS — Z3A29 29 weeks gestation of pregnancy: Secondary | ICD-10-CM

## 2020-05-14 LAB — POCT URINALYSIS DIPSTICK OB: Glucose, UA: NEGATIVE

## 2020-05-14 NOTE — Progress Notes (Signed)
   PRENATAL VISIT NOTE  Subjective:  Chelsea Ferguson is a 24 y.o. G2P0101 at [redacted]w[redacted]d being seen today for ongoing prenatal care.  She is currently monitored for the following issues for this high-risk pregnancy and has Obesity (BMI 30.0-34.9); Anxiety and depression; GERD (gastroesophageal reflux disease); Tingling; Supervision of other normal pregnancy, antepartum; History of pre-eclampsia in prior pregnancy, currently pregnant; Antepartum bleeding, first trimester; History of postpartum hemorrhage, currently pregnant; and COVID-19 affecting pregnancy in second trimester on their problem list.  Patient reports swelling in legs. No headache, vision changes. BP at home 130s.  Contractions: Not present. Vag. Bleeding: None.  Movement: Present. Denies leaking of fluid.   The following portions of the patient's history were reviewed and updated as appropriate: allergies, current medications, past family history, past medical history, past social history, past surgical history and problem list.   Objective:   Vitals:   05/14/20 0848  BP: 140/64  Pulse: 84  Weight: 176 lb (79.8 kg)    Fetal Status: Fetal Heart Rate (bpm): 145   Movement: Present     General:  Alert, oriented and cooperative. Patient is in no acute distress.  Skin: Skin is warm and dry. No rash noted.   Cardiovascular: Normal heart rate noted  Respiratory: Normal respiratory effort, no problems with respiration noted  Abdomen: Soft, gravid, appropriate for gestational age.  Pain/Pressure: Absent     Pelvic: Cervical exam deferred        Extremities: Normal range of motion.  Edema: None  Mental Status: Normal mood and affect. Normal behavior. Normal judgment and thought content.   Assessment and Plan:  Pregnancy: G2P0101 at [redacted]w[redacted]d 1. [redacted] weeks gestation of pregnancy - Protein / creatinine ratio, urine - POC Urinalysis Dipstick OB  2. Supervision of other normal pregnancy, antepartum Return tomorrow for 2hr GTT. Will get  CBC and CMP - Protein / creatinine ratio, urine - POC Urinalysis Dipstick OB  3. History of pre-eclampsia in prior pregnancy, currently pregnant On asa $Remo'81mg'xhNBC$ . Likely starting preeclampsia - will recheck BP tomorrow and get labs - CBC - Comp Met (CMET) - Protein / creatinine ratio, urine - POC Urinalysis Dipstick OB  4. COVID-19 affecting pregnancy in second trimester  5. History of postpartum hemorrhage, currently pregnant Aggressive management  6. Anxiety and depression   Preterm labor symptoms and general obstetric precautions including but not limited to vaginal bleeding, contractions, leaking of fluid and fetal movement were reviewed in detail with the patient. Please refer to After Visit Summary for other counseling recommendations.   No follow-ups on file.  Future Appointments  Date Time Provider [place]  05/15/2020  9:00 AM [facility] NURSE [facility] None  06/24/2020  9:15 AM [facility] US2 [facility] [place], DO

## 2020-05-14 NOTE — Progress Notes (Signed)
Patient concerned because her bps at home systolic at running in the 130s. Patient added this appointment because she is also having swelling in her legs. Armandina Stammer RN

## 2020-05-15 ENCOUNTER — Encounter (HOSPITAL_COMMUNITY): Payer: Self-pay | Admitting: Obstetrics and Gynecology

## 2020-05-15 ENCOUNTER — Other Ambulatory Visit: Payer: Self-pay

## 2020-05-15 ENCOUNTER — Other Ambulatory Visit: Payer: BC Managed Care – PPO

## 2020-05-15 ENCOUNTER — Inpatient Hospital Stay (HOSPITAL_COMMUNITY)
Admission: AD | Admit: 2020-05-15 | Discharge: 2020-05-18 | DRG: 807 | Disposition: A | Discharge status: Home / Self Care | Payer: BC Managed Care – PPO | Attending: Obstetrics and Gynecology | Admitting: Obstetrics and Gynecology

## 2020-05-15 DIAGNOSIS — O09299 Supervision of pregnancy with other poor reproductive or obstetric history, unspecified trimester: Secondary | ICD-10-CM

## 2020-05-15 DIAGNOSIS — E876 Hypokalemia: Secondary | ICD-10-CM

## 2020-05-15 DIAGNOSIS — O139 Gestational [pregnancy-induced] hypertension without significant proteinuria, unspecified trimester: Secondary | ICD-10-CM | POA: Diagnosis not present

## 2020-05-15 DIAGNOSIS — R000 Tachycardia, unspecified: Secondary | ICD-10-CM | POA: Diagnosis present

## 2020-05-15 DIAGNOSIS — O4700 False labor before 37 completed weeks of gestation, unspecified trimester: Secondary | ICD-10-CM

## 2020-05-15 DIAGNOSIS — O9942 Diseases of the circulatory system complicating childbirth: Secondary | ICD-10-CM | POA: Diagnosis present

## 2020-05-15 DIAGNOSIS — Z3480 Encounter for supervision of other normal pregnancy, unspecified trimester: Secondary | ICD-10-CM

## 2020-05-15 DIAGNOSIS — O368390 Maternal care for abnormalities of the fetal heart rate or rhythm, unspecified trimester, not applicable or unspecified: Secondary | ICD-10-CM

## 2020-05-15 DIAGNOSIS — O209 Hemorrhage in early pregnancy, unspecified: Secondary | ICD-10-CM

## 2020-05-15 DIAGNOSIS — Z3A30 30 weeks gestation of pregnancy: Secondary | ICD-10-CM

## 2020-05-15 DIAGNOSIS — Z3689 Encounter for other specified antenatal screening: Secondary | ICD-10-CM

## 2020-05-15 DIAGNOSIS — O134 Gestational [pregnancy-induced] hypertension without significant proteinuria, complicating childbirth: Principal | ICD-10-CM | POA: Diagnosis present

## 2020-05-15 DIAGNOSIS — Z8616 Personal history of COVID-19: Secondary | ICD-10-CM

## 2020-05-15 DIAGNOSIS — Z23 Encounter for immunization: Secondary | ICD-10-CM

## 2020-05-15 LAB — PROTEIN / CREATININE RATIO, URINE
Creatinine, Urine: 64.8 mg/dL
Protein, Ur: 9.5 mg/dL
Protein/Creat Ratio: 147 mg/g creat (ref 0–200)

## 2020-05-15 MED ORDER — LACTATED RINGERS IV BOLUS
1000.0000 mL | Freq: Once | INTRAVENOUS | Status: AC
  Administered 2020-05-16: 1000 mL via INTRAVENOUS

## 2020-05-15 MED ORDER — NIFEDIPINE 10 MG PO CAPS
10.0000 mg | ORAL_CAPSULE | ORAL | Status: AC | PRN
  Administered 2020-05-16 (×3): 10 mg via ORAL
  Filled 2020-05-15 (×3): qty 1

## 2020-05-15 NOTE — Progress Notes (Signed)
Chart reviewed - agree with CMA/RN documentation.  ° °

## 2020-05-15 NOTE — MAU Note (Signed)
Pt reports contractions since 1730, lower back pain, spotting. Baby moving good.

## 2020-05-15 NOTE — MAU Provider Note (Signed)
History     CSN: [id number]  Arrival date and time: 05/15/20 2230   First Provider Initiated Contact with Patient 05/15/20 2318      Chief Complaint  Patient presents with  . Contractions  . Vaginal Bleeding   [name] is a 24 y.o. G2P0101 at [redacted]w[redacted]d who presents to MAU for PTL evaluation after some cramping began this morning. Patient reports the cramping turned in to contractions, which at first felt mild and regular and not painful. Patient reports when they first started they were every 4 minutes, but then reports they have become closer together and have remained regular. Patient reports contractions are painful, but she is able to to talk through them.  Patient also endorses one episode of spotting on the toilet paper after urinating about 1 hour ago.  Patient endorses diarrhea today that was watery.  Pt denies change in vaginal discharge amount/color/consistency, new onset backache. Pt denies chest pain and SOB.  Pt denies constipation, or urinary problems. Pt denies fever, chills, fatigue, sweating or changes in appetite. Pt denies dizziness, light-headedness, weakness.  Pt denies LOF and reports good FM.  Current pregnancy problems? IVF, hx pre-clampsia, hx PPH Blood Type? O Positive Allergies? NKDA Current medications? Lexapro, low dose ASA, protonix Current PNC & next appt? [facility], 05/26/2020   OB History    Gravida  2   Para  1   Term      Preterm  1   AB      Living  1     SAB      TAB      Ectopic      Multiple  0   Live Births  1           Past Medical History:  Diagnosis Date  . Anxiety and depression   . Gestational hypertension w/o significant proteinuria in 3rd trimester   . Headache   . Hypertension    during pregnancy   . IBS (irritable bowel syndrome)   . Infertility associated with anovulation 07/28/2014   Pregnancy conceived with IVF    . Infertility, female   . Obesity affecting pregnancy, antepartum  07/28/2014  . PCOS (polycystic ovarian syndrome)   . Severe preeclampsia 02/09/2015  . Supervision of normal first pregnancy 07/28/2014    Clinic [place] Prenatal Labs  Dating  LMP c/w 6 week ultrasound Blood type: --/--/O POS (12/09 2142)  Genetic Screen 1 Screen: NT normal         AFP:  Not done           Antibody: Neg  Anatomic [place]  Normal  Rubella:  immune  GTT Third trimester: 134 RPR:   NR  TDaP vaccine 12/23/14 HBsAg:   Neg  Flu vaccine 07/14/14 HIV: NONREACTIVE (12/09 2142)   GBS  GBS: negative (01/26/15)  Contraception Undecided Pa  . Threatened preterm labor, antepartum 01/12/2015    Past Surgical History:  Procedure Laterality Date  . NO PAST SURGERIES      Family History  Problem Relation Age of Onset  . Diabetes Maternal Grandmother   . Hypertension Maternal Grandmother   . Hypertension Paternal Grandmother   . Cancer Neg Hx     Social History   Tobacco Use  . Smoking status: Never Smoker  . Smokeless tobacco: Never Used  Vaping Use  . Vaping Use: Never used  Substance Use Topics  . Alcohol use: No  . Drug use: No    Allergies: No Known  Allergies  Medications Prior to Admission  Medication Sig Dispense Refill Last Dose  . aspirin 81 MG chewable tablet Chew by mouth daily.   05/14/2020 at Unknown time  . escitalopram (LEXAPRO) 10 MG tablet Take 10 mg by mouth daily.   05/14/2020 at Unknown time  . famotidine (PEPCID) 20 MG tablet Take 1 tablet by mouth once to twice daily for heartburn. 60 tablet 0 05/15/2020 at Unknown time  . pantoprazole (PROTONIX) 40 MG tablet Take 1 tablet (40 mg total) by mouth daily. 30 tablet 3 05/15/2020 at Unknown time  . Prenatal Vit-Fe Fumarate-FA (PRENATAL VITAMINS PO) Take by mouth.   05/14/2020 at Unknown time  . Doxylamine-Pyridoxine (DICLEGIS) 10-10 MG TBEC Take 2 tablets by mouth at bedtime. If symptoms persist, add one tablet in the morning and one in the afternoon (Patient not taking: Reported on 05/14/2020) 100 tablet 5   . ondansetron  (ZOFRAN ODT) 4 MG disintegrating tablet Take 1 tablet (4 mg total) by mouth every 8 (eight) hours as needed for nausea or vomiting. 30 tablet 2   . promethazine (PHENERGAN) 25 MG suppository Place 1 suppository (25 mg total) rectally every 6 (six) hours as needed for nausea or vomiting. (Patient not taking: Reported on 05/14/2020) 60 each 6     Review of Systems  Constitutional: Negative for chills, diaphoresis, fatigue and fever.  Eyes: Negative for visual disturbance.  Respiratory: Negative for shortness of breath.   Cardiovascular: Negative for chest pain.  Gastrointestinal: Positive for abdominal pain and diarrhea. Negative for constipation, nausea and vomiting.  Genitourinary: Positive for pelvic pain and vaginal bleeding. Negative for dysuria, flank pain, frequency, urgency and vaginal discharge.  Neurological: Negative for dizziness, weakness, light-headedness and headaches.   Physical Exam   Blood pressure 117/60, pulse (!) 139, temperature 98.5 F (36.9 C), resp. rate 17, height 5\' 1"  (1.549 m), weight 78.5 kg, last menstrual period 10/17/2019, SpO2 100 %.  Patient Vitals for the past 24 hrs:  BP Temp Pulse Resp SpO2 Height Weight  05/16/20 0031 117/60 -- (!) 139 -- -- -- --  05/16/20 0001 131/77 -- (!) 106 -- -- -- --  05/15/20 2331 116/80 -- (!) 112 -- -- -- --  05/15/20 2301 132/86 -- (!) 105 -- -- -- --  05/15/20 2252 (!) 144/74 -- (!) 114 -- -- -- --  05/15/20 2242 (!) 150/85 98.5 F (36.9 C) (!) 111 17 100 % 5\' 1"  (1.549 m) 78.5 kg   Physical Exam Vitals and nursing note reviewed. Exam conducted with a chaperone present.  Constitutional:      General: She is not in acute distress.    Appearance: Normal appearance. She is not ill-appearing, toxic-appearing or diaphoretic.  HENT:     Head: Normocephalic and atraumatic.  Pulmonary:     Effort: Pulmonary effort is normal.  Abdominal:     Palpations: Abdomen is soft.  Genitourinary:    Labia:        Right: No rash,  tenderness, lesion or injury.        Left: No rash, tenderness, lesion or injury.      Vagina: No vaginal discharge, tenderness or bleeding.     Cervix: Discharge (thick mucus-like discharge at os mixed with streaks of bright red blood) and cervical bleeding present. No friability or erythema.     Comments: Dilation: 1.5 Effacement (%): 50 Cervical Position: Middle Station: Ballotable Presentation: Undeterminable Exam by:: NN Neurological:     Mental Status: She is alert and oriented  to person, place, and time.  Psychiatric:        Mood and Affect: Mood normal.        Behavior: Behavior normal.        Thought Content: Thought content normal.        Judgment: Judgment normal.    Orders Placed This Encounter  Procedures  . Wet prep, genital    Standing Status:   Standing    Number of Occurrences:   1  . Culture, OB Urine    Standing Status:   Standing    Number of Occurrences:   1  . [place] MFM OB LIMITED    Standing Status:   Standing    Number of Occurrences:   1    Order Specific Question:   Symptom/Reason for Exam    Answer:   Variable fetal heart rate decelerations, antepartum [[id number]]  . Fetal fibronectin    Standing Status:   Standing    Number of Occurrences:   1  . CBC    Standing Status:   Standing    Number of Occurrences:   1  . Comprehensive metabolic panel    Standing Status:   Standing    Number of Occurrences:   1  . Protein / creatinine ratio, urine    Standing Status:   Standing    Number of Occurrences:   1  . Urinalysis, Routine w reflex microscopic    Standing Status:   Standing    Number of Occurrences:   1  . Diet regular Room service appropriate? Yes; Fluid consistency: Thin    Standing Status:   Standing    Number of Occurrences:   1    Order Specific Question:   Room service appropriate?    Answer:   Yes    Order Specific Question:   Fluid consistency:    Answer:   Thin  . Notify physician (specify)    Standing Status:   Standing    Number  of Occurrences:   20    Order Specific Question:   Notify Physician    Answer:   for pulse less than 60 or greater than 120    Order Specific Question:   Notify Physician    Answer:   for respiratory rate less than 12 or greater than 28    Order Specific Question:   Notify Physician    Answer:   for temperature greater than 100.4    Order Specific Question:   Notify Physician    Answer:   for urinary output less than 30 ml/hr    Order Specific Question:   Notify Physician    Answer:   for systolic BP less than 80 or greater than 140    Order Specific Question:   Notify Physician    Answer:   for diastolic BP less than 40 or greater than 90  . Vital signs    While awake, respect sleep.    Standing Status:   Standing    Number of Occurrences:   1  . Defer vaginal exam for vaginal bleeding or PROM <37 weeks    Standing Status:   Standing    Number of Occurrences:   1  . Initiate Oral Care Protocol    Standing Status:   Standing    Number of Occurrences:   1  . Initiate Carrier Fluid Protocol    Standing Status:   Standing    Number of Occurrences:   1  .  Continuous tocometry    Standing Status:   Standing    Number of Occurrences:   1  . Fetal monitoring    Standing Status:   Standing    Number of Occurrences:   1  . Bed rest with bathroom privileges    Standing Status:   Standing    Number of Occurrences:   1  . Full code    Standing Status:   Standing    Number of Occurrences:   1  . Type and screen [facility]    [facility]     Standing Status:   Standing    Number of Occurrences:   1  . Insert peripheral IV    Standing Status:   Standing    Number of Occurrences:   1  . Place in observation (patient's expected length of stay will be less than 2 midnights)    Standing Status:   Standing    Number of Occurrences:   1    Order Specific Question:   Hospital Area    Answer:   [facility] [[id number]]    Order Specific  Question:   Level of Care    Answer:   Antepartum [20]    Order Specific Question:   Covid Evaluation    Answer:   Asymptomatic Screening Protocol (No Symptoms)    Order Specific Question:   Diagnosis    Answer:   Preterm uterine contractions [705013]    Order Specific Question:   Admitting Physician    Answer:   Hermina Staggers [[id number]]    Order Specific Question:   Attending Physician    Answer:   Nettie Elm L [[id number]]   Meds ordered this encounter  Medications  . NIFEdipine (PROCARDIA) capsule 10 mg  . lactated ringers bolus 1,000 mL  . acetaminophen (TYLENOL) tablet 650 mg  . zolpidem (AMBIEN) tablet 5 mg  . docusate sodium (COLACE) capsule 100 mg  . calcium carbonate (TUMS - dosed in mg elemental calcium) chewable tablet 400 mg of elemental calcium  . prenatal multivitamin tablet 1 tablet  . lactated ringers 1,000 mL with potassium chloride 20 mEq infusion  . betamethasone acetate-betamethasone sodium phosphate (CELESTONE) injection 12 mg    MAU Course  Procedures  MDM -r/o PTL -hx significant for IVF pregnancy, hx Pre-E, hx PPH -pt with cramping worsening to ctx throughout the day with some blood when wiping at home x1 -diarrhea today at home -on exam bright red blood on gloves -speculum exam limited d/t fetal heart rate decelerations, but blood mixed with mucus noted on exam, no active bleeding noted  -UA: SG1.003/mod hgb/rare bacteria, sending urine for culture based on symptoms -CE: Dilation: 1.5 Effacement (%): 50 Cervical Position: Middle Station: Ballotable Presentation: Undeterminable Exam by:: Avriana Joo,NP -fFN: POSITIVE -WetPrep: WNL -GC/CT collected -EFM: reactive with variables       -baseline: 150       -variability: moderate       -accels: present, 10x10       -decels: multiple variables into 80s/90s/60s, Dr. Alysia Penna notified at time of first occurence       -TOCO: ctx q2-79min -[place]: VTX, anterior placenta, marginal cord insertion, no abruption or  previa, AFI 18.9cm, CL 3.44cm  -elevated BP on arrival to MAU, hx pre-eclampsia, labs drawn, pressures normal after initial 2, no severe range -CBC: H/H 11.3/32.4, platelets 292 -CMP: K2.7,serum creatinine 0.63, AST/ALT 23/10 -PCr: 0.14  -cervical recheck 2 hours later unchanged  -  consulted with Dr. Leeanne Mannan [id number] [redacted]w[redacted]d here for PTL ctx, -hx significant for IVF pregnancy, hx Pre-E, hx PPH, bloody show, cervix unchanged after 2 hours, +fFN, K2.7, ctx persistent at same level after Procardia and Fluids, palpate firm at bedside, HR 120s too high for terbutaline  -per Dr. Alysia Penna, will admit to [facility]  Orders Placed This Encounter  Procedures  . Wet prep, genital    Standing Status:   Standing    Number of Occurrences:   1  . Culture, OB Urine    Standing Status:   Standing    Number of Occurrences:   1  . [place] MFM OB LIMITED    Standing Status:   Standing    Number of Occurrences:   1    Order Specific Question:   Symptom/Reason for Exam    Answer:   Variable fetal heart rate decelerations, antepartum [[id number]]  . Fetal fibronectin    Standing Status:   Standing    Number of Occurrences:   1  . CBC    Standing Status:   Standing    Number of Occurrences:   1  . Comprehensive metabolic panel    Standing Status:   Standing    Number of Occurrences:   1  . Protein / creatinine ratio, urine    Standing Status:   Standing    Number of Occurrences:   1  . Urinalysis, Routine w reflex microscopic    Standing Status:   Standing    Number of Occurrences:   1  . Diet regular Room service appropriate? Yes; Fluid consistency: Thin    Standing Status:   Standing    Number of Occurrences:   1    Order Specific Question:   Room service appropriate?    Answer:   Yes    Order Specific Question:   Fluid consistency:    Answer:   Thin  . Notify physician (specify)    Standing Status:   Standing    Number of Occurrences:   20    Order Specific Question:   Notify Physician     Answer:   for pulse less than 60 or greater than 120    Order Specific Question:   Notify Physician    Answer:   for respiratory rate less than 12 or greater than 28    Order Specific Question:   Notify Physician    Answer:   for temperature greater than 100.4    Order Specific Question:   Notify Physician    Answer:   for urinary output less than 30 ml/hr    Order Specific Question:   Notify Physician    Answer:   for systolic BP less than 80 or greater than 140    Order Specific Question:   Notify Physician    Answer:   for diastolic BP less than 40 or greater than 90  . Vital signs    While awake, respect sleep.    Standing Status:   Standing    Number of Occurrences:   1  . Defer vaginal exam for vaginal bleeding or PROM <37 weeks    Standing Status:   Standing    Number of Occurrences:   1  . Initiate Oral Care Protocol    Standing Status:   Standing    Number of Occurrences:   1  . Initiate Carrier Fluid Protocol    Standing Status:   Standing    Number of Occurrences:  1  . Continuous tocometry    Standing Status:   Standing    Number of Occurrences:   1  . Fetal monitoring    Standing Status:   Standing    Number of Occurrences:   1  . Bed rest with bathroom privileges    Standing Status:   Standing    Number of Occurrences:   1  . Full code    Standing Status:   Standing    Number of Occurrences:   1  . Type and screen [facility]    [facility]     Standing Status:   Standing    Number of Occurrences:   1  . Insert peripheral IV    Standing Status:   Standing    Number of Occurrences:   1  . Place in observation (patient's expected length of stay will be less than 2 midnights)    Standing Status:   Standing    Number of Occurrences:   1    Order Specific Question:   Hospital Area    Answer:   [facility] [[id number]]    Order Specific Question:   Level of Care    Answer:   Antepartum [20]    Order Specific  Question:   Covid Evaluation    Answer:   Asymptomatic Screening Protocol (No Symptoms)    Order Specific Question:   Diagnosis    Answer:   Preterm uterine contractions [705013]    Order Specific Question:   Admitting Physician    Answer:   Hermina Staggers [[id number]]    Order Specific Question:   Attending Physician    Answer:   Nettie Elm L [[id number]]    Meds ordered this encounter  Medications  . NIFEdipine (PROCARDIA) capsule 10 mg  . lactated ringers bolus 1,000 mL  . acetaminophen (TYLENOL) tablet 650 mg  . zolpidem (AMBIEN) tablet 5 mg  . docusate sodium (COLACE) capsule 100 mg  . calcium carbonate (TUMS - dosed in mg elemental calcium) chewable tablet 400 mg of elemental calcium  . prenatal multivitamin tablet 1 tablet  . lactated ringers 1,000 mL with potassium chloride 20 mEq infusion  . betamethasone acetate-betamethasone sodium phosphate (CELESTONE) injection 12 mg    Assessment and Plan   1. Preterm contractions   2. Supervision of other normal pregnancy, antepartum   3. History of pre-eclampsia in prior pregnancy, currently pregnant   4. Antepartum bleeding, first trimester   5. History of postpartum hemorrhage, currently pregnant   6. Variable fetal heart rate decelerations, antepartum   7. [redacted] weeks gestation of pregnancy   8. NST (non-stress test) reactive   9. Hypokalemia     -admit to [facility]  Odie Sera Aleathea Pugmire 05/16/2020, 3:04 AM

## 2020-05-15 NOTE — Progress Notes (Signed)
Pt presents for 2 hr GTT. Pt was given lab slip and sent to the lab.  Siena Poehler l Wanda Cellucci, CMA 

## 2020-05-16 ENCOUNTER — Inpatient Hospital Stay (HOSPITAL_COMMUNITY): Payer: BC Managed Care – PPO | Admitting: Anesthesiology

## 2020-05-16 ENCOUNTER — Inpatient Hospital Stay (HOSPITAL_BASED_OUTPATIENT_CLINIC_OR_DEPARTMENT_OTHER): Payer: BC Managed Care – PPO

## 2020-05-16 DIAGNOSIS — O43193 Other malformation of placenta, third trimester: Secondary | ICD-10-CM

## 2020-05-16 DIAGNOSIS — Z3A30 30 weeks gestation of pregnancy: Secondary | ICD-10-CM

## 2020-05-16 DIAGNOSIS — Z8616 Personal history of COVID-19: Secondary | ICD-10-CM | POA: Diagnosis not present

## 2020-05-16 DIAGNOSIS — O4700 False labor before 37 completed weeks of gestation, unspecified trimester: Secondary | ICD-10-CM | POA: Diagnosis present

## 2020-05-16 DIAGNOSIS — O99213 Obesity complicating pregnancy, third trimester: Secondary | ICD-10-CM

## 2020-05-16 DIAGNOSIS — O139 Gestational [pregnancy-induced] hypertension without significant proteinuria, unspecified trimester: Secondary | ICD-10-CM | POA: Diagnosis not present

## 2020-05-16 DIAGNOSIS — O134 Gestational [pregnancy-induced] hypertension without significant proteinuria, complicating childbirth: Secondary | ICD-10-CM | POA: Diagnosis present

## 2020-05-16 DIAGNOSIS — O4693 Antepartum hemorrhage, unspecified, third trimester: Secondary | ICD-10-CM | POA: Diagnosis not present

## 2020-05-16 DIAGNOSIS — E669 Obesity, unspecified: Secondary | ICD-10-CM

## 2020-05-16 DIAGNOSIS — Z23 Encounter for immunization: Secondary | ICD-10-CM | POA: Diagnosis not present

## 2020-05-16 DIAGNOSIS — O09293 Supervision of pregnancy with other poor reproductive or obstetric history, third trimester: Secondary | ICD-10-CM

## 2020-05-16 DIAGNOSIS — R000 Tachycardia, unspecified: Secondary | ICD-10-CM | POA: Diagnosis present

## 2020-05-16 DIAGNOSIS — O9942 Diseases of the circulatory system complicating childbirth: Secondary | ICD-10-CM | POA: Diagnosis present

## 2020-05-16 DIAGNOSIS — O09813 Supervision of pregnancy resulting from assisted reproductive technology, third trimester: Secondary | ICD-10-CM

## 2020-05-16 LAB — PROTEIN / CREATININE RATIO, URINE
Creatinine, Urine: 48.28 mg/dL
Protein Creatinine Ratio: 0.14 mg/mg{Cre} (ref 0.00–0.15)
Total Protein, Urine: 7 mg/dL

## 2020-05-16 LAB — FETAL FIBRONECTIN: Fetal Fibronectin: POSITIVE — AB

## 2020-05-16 LAB — GLUCOSE TOLERANCE, 2 HOURS W/ 1HR
Glucose, 1 hour: 137 mg/dL (ref 65–179)
Glucose, 2 hour: 142 mg/dL (ref 65–152)
Glucose, Fasting: 77 mg/dL (ref 65–91)

## 2020-05-16 LAB — COMPREHENSIVE METABOLIC PANEL
ALT: 10 U/L (ref 0–44)
ALT: 5 IU/L (ref 0–32)
AST: 23 U/L (ref 15–41)
AST: 15 IU/L (ref 0–40)
Albumin/Globulin Ratio: 1.6 (ref 1.2–2.2)
Albumin: 2.3 g/dL — ABNORMAL LOW (ref 3.5–5.0)
Albumin: 2.9 g/dL — ABNORMAL LOW (ref 3.9–5.0)
Alkaline Phosphatase: 88 U/L (ref 38–126)
Alkaline Phosphatase: 104 IU/L (ref 44–121)
Anion gap: 14 (ref 5–15)
BUN/Creatinine Ratio: 3 — ABNORMAL LOW (ref 9–23)
BUN: <5 mg/dL — ABNORMAL LOW (ref 6–20)
BUN: 2 mg/dL — ABNORMAL LOW (ref 6–20)
Bilirubin Total: 0.3 mg/dL (ref 0.0–1.2)
CO2: 19 mmol/L — ABNORMAL LOW (ref 22–32)
CO2: 23 mmol/L (ref 20–29)
Calcium: 8.9 mg/dL (ref 8.9–10.3)
Calcium: 8.6 mg/dL — ABNORMAL LOW (ref 8.7–10.2)
Chloride: 104 mmol/L (ref 98–111)
Chloride: 104 mmol/L (ref 96–106)
Creatinine, Ser: 0.63 mg/dL (ref 0.44–1.00)
Creatinine, Ser: 0.58 mg/dL (ref 0.57–1.00)
GFR calc Af Amer: 147 mL/min/{1.73_m2} (ref >59)
GFR calc non Af Amer: 128 mL/min/{1.73_m2} (ref >59)
GFR, Estimated: >60 mL/min (ref >60)
Globulin, Total: 1.8 g/dL (ref 1.5–4.5)
Glucose, Bld: 117 mg/dL — ABNORMAL HIGH (ref 70–99)
Glucose: 82 mg/dL (ref 65–99)
Potassium: 2.7 mmol/L — CL (ref 3.5–5.1)
Potassium: 3.5 mmol/L (ref 3.5–5.2)
Sodium: 137 mmol/L (ref 135–145)
Sodium: 141 mmol/L (ref 134–144)
Total Bilirubin: 0.3 mg/dL (ref 0.3–1.2)
Total Protein: 5.4 g/dL — ABNORMAL LOW (ref 6.5–8.1)
Total Protein: 4.7 g/dL — ABNORMAL LOW (ref 6.0–8.5)

## 2020-05-16 LAB — URINALYSIS, ROUTINE W REFLEX MICROSCOPIC
Bilirubin Urine: NEGATIVE
Glucose, UA: NEGATIVE mg/dL
Ketones, ur: NEGATIVE mg/dL
Leukocytes,Ua: NEGATIVE
Nitrite: NEGATIVE
Protein, ur: NEGATIVE mg/dL
Specific Gravity, Urine: 1.003 — ABNORMAL LOW (ref 1.005–1.030)
pH: 8 (ref 5.0–8.0)

## 2020-05-16 LAB — CBC
HCT: 29.6 % — ABNORMAL LOW (ref 36.0–46.0)
HCT: 32.4 % — ABNORMAL LOW (ref 36.0–46.0)
Hematocrit: 31.8 % — ABNORMAL LOW (ref 34.0–46.6)
Hemoglobin: 10.8 g/dL — ABNORMAL LOW (ref 11.1–15.9)
Hemoglobin: 11.3 g/dL — ABNORMAL LOW (ref 12.0–15.0)
Hemoglobin: 9.9 g/dL — ABNORMAL LOW (ref 12.0–15.0)
MCH: 29.6 pg (ref 26.0–34.0)
MCH: 30.4 pg (ref 26.6–33.0)
MCH: 31 pg (ref 26.0–34.0)
MCHC: 33.4 g/dL (ref 30.0–36.0)
MCHC: 34 g/dL (ref 31.5–35.7)
MCHC: 34.9 g/dL (ref 30.0–36.0)
MCV: 88.6 fL (ref 80.0–100.0)
MCV: 89 fL (ref 80.0–100.0)
MCV: 90 fL (ref 79–97)
Platelets: 254 10*3/uL (ref 150–450)
Platelets: 265 10*3/uL (ref 150–400)
Platelets: 292 10*3/uL (ref 150–400)
RBC: 3.34 MIL/uL — ABNORMAL LOW (ref 3.87–5.11)
RBC: 3.55 x10E6/uL — ABNORMAL LOW (ref 3.77–5.28)
RBC: 3.64 MIL/uL — ABNORMAL LOW (ref 3.87–5.11)
RDW: 15.9 % — ABNORMAL HIGH (ref 11.5–15.5)
RDW: 15.9 % — ABNORMAL HIGH (ref 11.5–15.5)
RDW: 16.4 % — ABNORMAL HIGH (ref 11.7–15.4)
WBC: 14.7 10*3/uL — ABNORMAL HIGH (ref 4.0–10.5)
WBC: 21.7 10*3/uL — ABNORMAL HIGH (ref 4.0–10.5)
WBC: 8.9 10*3/uL (ref 3.4–10.8)
nRBC: 0 % (ref 0.0–0.2)
nRBC: 0 % (ref 0.0–0.2)

## 2020-05-16 LAB — PREPARE RBC (CROSSMATCH)

## 2020-05-16 LAB — HIV ANTIBODY (ROUTINE TESTING W REFLEX): HIV Screen 4th Generation wRfx: NONREACTIVE

## 2020-05-16 LAB — RPR: RPR Ser Ql: NONREACTIVE

## 2020-05-16 LAB — WET PREP, GENITAL
Clue Cells Wet Prep HPF POC: NONE SEEN
Sperm: NONE SEEN
Trich, Wet Prep: NONE SEEN
WBC, Wet Prep HPF POC: NONE SEEN
Yeast Wet Prep HPF POC: NONE SEEN

## 2020-05-16 MED ORDER — PHENYLEPHRINE 40 MCG/ML (10ML) SYRINGE FOR IV PUSH (FOR BLOOD PRESSURE SUPPORT): 80.0000 ug | PREFILLED_SYRINGE | INTRAVENOUS | Status: DC | PRN

## 2020-05-16 MED ORDER — ONDANSETRON HCL 4 MG PO TABS: 4.0000 mg | ORAL_TABLET | ORAL | Status: DC | PRN

## 2020-05-16 MED ORDER — EPHEDRINE 5 MG/ML INJ: 10.0000 mg | INTRAVENOUS | Status: DC | PRN

## 2020-05-16 MED ORDER — SODIUM CHLORIDE 0.9 % IV BOLUS: 500.0000 mL | Freq: Once | INTRAVENOUS | Status: DC

## 2020-05-16 MED ORDER — POTASSIUM CHLORIDE CRYS ER 20 MEQ PO TBCR
40.0000 meq | EXTENDED_RELEASE_TABLET | Freq: Two times a day (BID) | ORAL | Status: DC
  Administered 2020-05-16 – 2020-05-18 (×4): 40 meq via ORAL
  Filled 2020-05-16 (×4): qty 2

## 2020-05-16 MED ORDER — DIPHENHYDRAMINE HCL 25 MG PO CAPS: 25.0000 mg | ORAL_CAPSULE | Freq: Four times a day (QID) | ORAL | Status: DC | PRN

## 2020-05-16 MED ORDER — ESCITALOPRAM OXALATE 10 MG PO TABS
10.0000 mg | ORAL_TABLET | Freq: Every day | ORAL | Status: DC
  Administered 2020-05-16 – 2020-05-18 (×3): 10 mg via ORAL
  Filled 2020-05-16 (×3): qty 1

## 2020-05-16 MED ORDER — WITCH HAZEL-GLYCERIN EX PADS: 1.0000 "application " | MEDICATED_PAD | CUTANEOUS | Status: DC | PRN

## 2020-05-16 MED ORDER — SOD CITRATE-CITRIC ACID 500-334 MG/5ML PO SOLN: 30.0000 mL | ORAL | Status: DC | PRN

## 2020-05-16 MED ORDER — ONDANSETRON HCL 4 MG/2ML IJ SOLN: 4.0000 mg | Freq: Four times a day (QID) | INTRAMUSCULAR | Status: DC | PRN

## 2020-05-16 MED ORDER — FENTANYL-BUPIVACAINE-NACL 0.5-0.125-0.9 MG/250ML-% EP SOLN: 12.0000 mL/h | EPIDURAL | Status: DC | PRN

## 2020-05-16 MED ORDER — SIMETHICONE 80 MG PO CHEW: 80.0000 mg | CHEWABLE_TABLET | ORAL | Status: DC | PRN

## 2020-05-16 MED ORDER — ACETAMINOPHEN 325 MG PO TABS: 650.0000 mg | ORAL_TABLET | ORAL | Status: DC | PRN

## 2020-05-16 MED ORDER — LIDOCAINE HCL (PF) 1 % IJ SOLN: 30.0000 mL | INTRAMUSCULAR | Status: DC | PRN

## 2020-05-16 MED ORDER — LACTATED RINGERS IV SOLN: 500.0000 mL | INTRAVENOUS | Status: DC | PRN

## 2020-05-16 MED ORDER — ZOLPIDEM TARTRATE 5 MG PO TABS
5.0000 mg | ORAL_TABLET | Freq: Every evening | ORAL | Status: DC | PRN
  Administered 2020-05-16 – 2020-05-18 (×2): 5 mg via ORAL
  Filled 2020-05-16 (×2): qty 1

## 2020-05-16 MED ORDER — TRANEXAMIC ACID-NACL 1000-0.7 MG/100ML-% IV SOLN
1000.0000 mg | Freq: Once | INTRAVENOUS | Status: AC | PRN
  Administered 2020-05-16: 1000 mg via INTRAVENOUS
  Filled 2020-05-16: qty 100

## 2020-05-16 MED ORDER — SODIUM CHLORIDE 0.9 % IV SOLN
1.0000 g | INTRAVENOUS | Status: DC
  Filled 2020-05-16 (×3): qty 1000

## 2020-05-16 MED ORDER — IBUPROFEN 600 MG PO TABS
600.0000 mg | ORAL_TABLET | Freq: Four times a day (QID) | ORAL | Status: DC
  Administered 2020-05-16 – 2020-05-18 (×5): 600 mg via ORAL
  Filled 2020-05-16 (×5): qty 1

## 2020-05-16 MED ORDER — LIDOCAINE HCL (PF) 1 % IJ SOLN
INTRAMUSCULAR | Status: AC
  Filled 2020-05-16: qty 30

## 2020-05-16 MED ORDER — NIFEDIPINE ER OSMOTIC RELEASE 30 MG PO TB24: 30.0000 mg | ORAL_TABLET | Freq: Every day | ORAL | Status: DC

## 2020-05-16 MED ORDER — OXYCODONE-ACETAMINOPHEN 5-325 MG PO TABS: 2.0000 | ORAL_TABLET | ORAL | Status: DC | PRN

## 2020-05-16 MED ORDER — BENZOCAINE-MENTHOL 20-0.5 % EX AERO
1.0000 "application " | INHALATION_SPRAY | CUTANEOUS | Status: DC | PRN
  Administered 2020-05-17: 1 via TOPICAL
  Filled 2020-05-16: qty 56

## 2020-05-16 MED ORDER — SODIUM CHLORIDE 0.9 % IV SOLN: 250.0000 mL | INTRAVENOUS | Status: DC | PRN

## 2020-05-16 MED ORDER — COCONUT OIL OIL: 1.0000 "application " | TOPICAL_OIL | Status: DC | PRN

## 2020-05-16 MED ORDER — ZOLPIDEM TARTRATE 5 MG PO TABS: 5.0000 mg | ORAL_TABLET | Freq: Every evening | ORAL | Status: DC | PRN

## 2020-05-16 MED ORDER — MAGNESIUM SULFATE BOLUS VIA INFUSION
4.0000 g | Freq: Once | INTRAVENOUS | Status: AC
  Administered 2020-05-16: 4 g via INTRAVENOUS
  Filled 2020-05-16: qty 1000

## 2020-05-16 MED ORDER — SODIUM CHLORIDE 0.9 % IV SOLN
2.0000 g | Freq: Once | INTRAVENOUS | Status: AC
  Administered 2020-05-16: 2 g via INTRAVENOUS
  Filled 2020-05-16: qty 2000

## 2020-05-16 MED ORDER — OXYCODONE-ACETAMINOPHEN 5-325 MG PO TABS
1.0000 | ORAL_TABLET | Freq: Once | ORAL | Status: AC
  Administered 2020-05-16: 1 via ORAL
  Filled 2020-05-16: qty 1

## 2020-05-16 MED ORDER — LACTATED RINGERS IV SOLN
500.0000 mL | Freq: Once | INTRAVENOUS | Status: AC
  Administered 2020-05-16: 500 mL via INTRAVENOUS

## 2020-05-16 MED ORDER — POTASSIUM CHLORIDE 2 MEQ/ML IV SOLN
INTRAVENOUS | Status: DC
  Filled 2020-05-16 (×2): qty 1000

## 2020-05-16 MED ORDER — OXYCODONE-ACETAMINOPHEN 5-325 MG PO TABS: 1.0000 | ORAL_TABLET | ORAL | Status: DC | PRN

## 2020-05-16 MED ORDER — FENTANYL CITRATE (PF) 100 MCG/2ML IJ SOLN
100.0000 ug | Freq: Once | INTRAMUSCULAR | Status: AC
  Administered 2020-05-16: 100 ug via INTRAVENOUS
  Filled 2020-05-16: qty 2

## 2020-05-16 MED ORDER — MEASLES, MUMPS & RUBELLA VAC IJ SOLR: 0.5000 mL | Freq: Once | INTRAMUSCULAR | Status: DC

## 2020-05-16 MED ORDER — MISOPROSTOL 200 MCG PO TABS
800.0000 ug | ORAL_TABLET | Freq: Once | ORAL | Status: AC | PRN
  Administered 2020-05-16: 800 ug via VAGINAL
  Filled 2020-05-16: qty 4

## 2020-05-16 MED ORDER — SENNOSIDES-DOCUSATE SODIUM 8.6-50 MG PO TABS
1.0000 | ORAL_TABLET | Freq: Two times a day (BID) | ORAL | Status: DC
  Administered 2020-05-16 – 2020-05-18 (×4): 1 via ORAL
  Filled 2020-05-16 (×4): qty 1

## 2020-05-16 MED ORDER — DIPHENHYDRAMINE HCL 50 MG/ML IJ SOLN: 12.5000 mg | INTRAMUSCULAR | Status: DC | PRN

## 2020-05-16 MED ORDER — LIDOCAINE-EPINEPHRINE (PF) 2 %-1:200000 IJ SOLN
INTRAMUSCULAR | Status: DC | PRN
  Administered 2020-05-16 (×2): 3 mL via EPIDURAL

## 2020-05-16 MED ORDER — OXYTOCIN BOLUS FROM INFUSION: 333.0000 mL | Freq: Once | INTRAVENOUS | Status: DC

## 2020-05-16 MED ORDER — BETAMETHASONE SOD PHOS & ACET 6 (3-3) MG/ML IJ SUSP: 12.0000 mg | Freq: Once | INTRAMUSCULAR | Status: DC

## 2020-05-16 MED ORDER — OXYTOCIN-SODIUM CHLORIDE 30-0.9 UT/500ML-% IV SOLN: 2.5000 [IU]/h | INTRAVENOUS | Status: DC

## 2020-05-16 MED ORDER — MAGNESIUM SULFATE 40 GM/1000ML IV SOLN
INTRAVENOUS | Status: AC
  Filled 2020-05-16: qty 1000

## 2020-05-16 MED ORDER — FENTANYL-BUPIVACAINE-NACL 0.5-0.125-0.9 MG/250ML-% EP SOLN
EPIDURAL | Status: AC
  Filled 2020-05-16: qty 250

## 2020-05-16 MED ORDER — MAGNESIUM SULFATE 40 GM/1000ML IV SOLN
2.0000 g/h | INTRAVENOUS | Status: DC
  Administered 2020-05-16: 2 g/h via INTRAVENOUS

## 2020-05-16 MED ORDER — ONDANSETRON HCL 4 MG/2ML IJ SOLN: 4.0000 mg | INTRAMUSCULAR | Status: DC | PRN

## 2020-05-16 MED ORDER — CALCIUM CARBONATE ANTACID 500 MG PO CHEW: 2.0000 | CHEWABLE_TABLET | ORAL | Status: DC | PRN

## 2020-05-16 MED ORDER — DOCUSATE SODIUM 100 MG PO CAPS
100.0000 mg | ORAL_CAPSULE | Freq: Every day | ORAL | Status: DC
  Administered 2020-05-16: 100 mg via ORAL
  Filled 2020-05-16: qty 1

## 2020-05-16 MED ORDER — TETANUS-DIPHTH-ACELL PERTUSSIS 5-2.5-18.5 LF-MCG/0.5 IM SUSP
0.5000 mL | Freq: Once | INTRAMUSCULAR | Status: AC
  Administered 2020-05-17: 0.5 mL via INTRAMUSCULAR
  Filled 2020-05-16: qty 0.5

## 2020-05-16 MED ORDER — ASPIRIN 81 MG PO CHEW
81.0000 mg | CHEWABLE_TABLET | Freq: Every day | ORAL | Status: DC
  Administered 2020-05-16: 81 mg via ORAL
  Filled 2020-05-16: qty 1

## 2020-05-16 MED ORDER — LACTATED RINGERS IV SOLN: INTRAVENOUS | Status: DC

## 2020-05-16 MED ORDER — BUPIVACAINE HCL (PF) 0.75 % IJ SOLN
INTRAMUSCULAR | Status: DC | PRN
Start: 2020-05-16 — End: 2020-05-16
  Administered 2020-05-16: 12 mL/h via EPIDURAL

## 2020-05-16 MED ORDER — SODIUM CHLORIDE 0.9% FLUSH: 3.0000 mL | INTRAVENOUS | Status: DC | PRN

## 2020-05-16 MED ORDER — SODIUM CHLORIDE 0.9% FLUSH
3.0000 mL | Freq: Two times a day (BID) | INTRAVENOUS | Status: DC
  Administered 2020-05-16 – 2020-05-17 (×2): 3 mL via INTRAVENOUS

## 2020-05-16 MED ORDER — PRENATAL MULTIVITAMIN CH
1.0000 | ORAL_TABLET | Freq: Every day | ORAL | Status: DC
  Administered 2020-05-16: 1 via ORAL
  Filled 2020-05-16: qty 1

## 2020-05-16 MED ORDER — BETAMETHASONE SOD PHOS & ACET 6 (3-3) MG/ML IJ SUSP
12.0000 mg | INTRAMUSCULAR | Status: DC
  Administered 2020-05-16: 12 mg via INTRAMUSCULAR
  Filled 2020-05-16: qty 5

## 2020-05-16 MED ORDER — DIBUCAINE (PERIANAL) 1 % EX OINT: 1.0000 "application " | TOPICAL_OINTMENT | CUTANEOUS | Status: DC | PRN

## 2020-05-16 NOTE — Progress Notes (Signed)
Labor Progress Note Chelsea Ferguson is a 24 y.o. G2P0101 at [redacted]w[redacted]d presented for PTL, recently sent from ante for cervical change  S:  Comfortable with epidural  O:  BP 130/70   Pulse (!) 149   Temp 98.3 F (36.8 C) (Oral)   Resp 20   Ht 5\' 1"  (1.549 m)   Wt 78.5 kg   LMP 10/17/2019   SpO2 100%   BMI 32.69 kg/m  EFM: baseline 155 bpm/ mod variability/ no accels/ occ variable decels  Toco/IUPC: 2-3 SVE: 6.5 per RN Vtx by BSUS by Dr. 12/17/2019  A/P: 24 y.o. G2P0101 [redacted]w[redacted]d  1. Labor: active 2. FWB: Cat II 3. Pain: epidural 4. GBS unknown 5. Maternal tachycardia  Start Ampicillin. NICU consult. EKG. Anticipate labor progression and SVD, NICU for delivery.  [redacted]w[redacted]d, CNM 9:14 AM

## 2020-05-16 NOTE — Anesthesia Preprocedure Evaluation (Addendum)
Anesthesia Evaluation  Patient identified by MRN, date of birth, ID band Patient awake    Reviewed: Allergy & Precautions, NPO status , Patient's Chart, lab work & pertinent test results  Airway Mallampati: II  TM Distance: >3 FB Neck ROM: Full    Dental no notable dental hx.    Pulmonary neg pulmonary ROS,    Pulmonary exam normal breath sounds clear to auscultation       Cardiovascular hypertension (gHTN), Normal cardiovascular exam Rhythm:Regular Rate:Normal     Neuro/Psych  Headaches, PSYCHIATRIC DISORDERS Anxiety Depression    GI/Hepatic Neg liver ROS, GERD  ,  Endo/Other  negative endocrine ROSPCOS  Renal/GU negative Renal ROS  negative genitourinary   Musculoskeletal negative musculoskeletal ROS (+)   Abdominal   Peds  Hematology negative hematology ROS (+)   Anesthesia Other Findings Preterm labor at [redacted] weeks gestation on mag, h/o PPH requiring 2U RBCs  Reproductive/Obstetrics (+) Pregnancy                            Anesthesia Physical Anesthesia Plan  ASA: III  Anesthesia Plan: Epidural   Post-op Pain Management:    Induction:   PONV Risk Score and Plan: Treatment may vary due to age or medical condition  Airway Management Planned: Natural Airway  Additional Equipment:   Intra-op Plan:   Post-operative Plan:   Informed Consent: I have reviewed the patients History and Physical, chart, labs and discussed the procedure including the risks, benefits and alternatives for the proposed anesthesia with the patient or authorized representative who has indicated his/her understanding and acceptance.       Plan Discussed with: Anesthesiologist  Anesthesia Plan Comments: (Patient identified. Risks, benefits, options discussed with patient including but not limited to bleeding, infection, nerve damage, paralysis, failed block, incomplete pain control, headache, blood  pressure changes, nausea, vomiting, reactions to medication, itching, and post partum back pain. Confirmed with bedside nurse the patient's most recent platelet count. Confirmed with the patient that they are not taking any anticoagulation, have any bleeding history or any family history of bleeding disorders. Patient expressed understanding and wishes to proceed. All questions were answered. )        Anesthesia Quick Evaluation

## 2020-05-16 NOTE — H&P (Signed)
[name] is a 24 y.o. G2P0101 at 5859w0d who presents to MAU for PTL evaluation after some cramping began this morning. Patient reports the cramping turned in to contractions, which at first felt mild and regular and not painful. Patient reports when they first started they were every 4 minutes, but then reports they have become closer together and have remained regular. Patient reports contractions are painful, but she is able to to talk through them.  Patient also endorses one episode of spotting on the toilet paper after urinating about 1 hour ago.  Patient endorses diarrhea today that was watery.  Pt denies change in vaginal discharge amount/color/consistency, new onset backache. Pt denies chest pain and SOB.  Pt denies constipation, or urinary problems. Pt denies fever, chills, fatigue, sweating or changes in appetite. Pt denies dizziness, light-headedness, weakness.  Pt denies LOF and reports good FM.  Current pregnancy problems? IVF, hx pre-clampsia, hx PPH Blood Type? O Positive Allergies? NKDA Current medications? Lexapro, low dose ASA, protonix Current PNC & next appt? [facility], 05/26/2020  OB History    Gravida  2   Para  1   Term      Preterm  1   AB      Living  1     SAB      TAB      Ectopic      Multiple  0   Live Births  1          Past Medical History:  Diagnosis Date  . Anxiety and depression   . Gestational hypertension w/o significant proteinuria in 3rd trimester   . Headache   . Hypertension    during pregnancy   . IBS (irritable bowel syndrome)   . Infertility associated with anovulation 07/28/2014   Pregnancy conceived with IVF    . Infertility, female   . Obesity affecting pregnancy, antepartum 07/28/2014  . PCOS (polycystic ovarian syndrome)   . Severe preeclampsia 02/09/2015  . Supervision of normal first pregnancy 07/28/2014    [facility] [place] Prenatal Labs  Dating  LMP c/w 6 week ultrasound Blood type: --/--/O POS  (12/09 2142)  Genetic Screen 1 Screen: NT normal         AFP:  Not done           Antibody: Neg  Anatomic US  Normal  Rubella:  immune  GTT Third trimester: 134 RPR:   NR  TDaP vaccine 12/23/14 HBsAg:   Neg  Flu vaccine 07/14/14 HIV: NONREACTIVE (12/09 2142)   GBS  GBS: negative (01/26/15)  Contraception Undecided Pa  . Threatened preterm labor, antepartum 01/12/2015   Past Surgical History:  Procedure Laterality Date  . NO PAST SURGERIES     Family History: family history includes Diabetes in her maternal grandmother; Hypertension in her maternal grandmother and paternal grandmother. Social History:  reports that she has never smoked. She has never used smokeless tobacco. She reports that she does not drink alcohol and does not use drugs.     Maternal Diabetes: No - unknown, has GTT yesterday Genetic Screening: Normal Maternal Ultrasounds/Referrals: Normal  Fetal Ultrasounds or other Referrals:  Fetal echo Maternal Substance Abuse:  No Significant Maternal Medications:  Meds include: Other: Lexapro, Protonix, low dose ASA Significant Maternal Lab Results:  Other:  K 2.7 Other Comments:  None  Review of Systems   Constitutional: Negative for chills, diaphoresis, fatigue and fever.  Eyes: Negative for visual disturbance.  Respiratory: Negative for shortness of breath.  Cardiovascular: Negative for chest pain.  Gastrointestinal: Positive for abdominal pain and diarrhea. Negative for constipation, nausea and vomiting.  Genitourinary: Positive for pelvic pain and vaginal bleeding. Negative for dysuria, flank pain, frequency, urgency and vaginal discharge.  Neurological: Negative for dizziness, weakness, light-headedness and headaches.   History Dilation: 1.5 Effacement (%): 50 Station: Ballotable Exam by:: Alanny Rivers,NP Blood pressure 117/60, pulse (!) 139, temperature 98.5 F (36.9 C), resp. rate 17, height 5\' 1"  (1.549 m), weight 78.5 kg, last menstrual period 10/17/2019, SpO2 100 %.    Patient Vitals for the past 24 hrs:  BP Temp Pulse Resp SpO2 Height Weight  05/16/20 0031 117/60 -- (!) 139 -- -- -- --  05/16/20 0001 131/77 -- (!) 106 -- -- -- --  05/15/20 2331 116/80 -- (!) 112 -- -- -- --  05/15/20 2301 132/86 -- (!) 105 -- -- -- --  05/15/20 2252 (!) 144/74 -- (!) 114 -- -- -- --  05/15/20 2242 (!) 150/85 98.5 F (36.9 C) (!) 111 17 100 % 5\' 1"  (1.549 m) 78.5 kg    Exam Physical Exam   Physical Exam Vitals and nursing note reviewed. Exam conducted with a chaperone present.  Constitutional:      General: She is not in acute distress.    Appearance: Normal appearance. She is not ill-appearing, toxic-appearing or diaphoretic.  HENT:     Head: Normocephalic and atraumatic.  Pulmonary:     Effort: Pulmonary effort is normal.  Abdominal:     Palpations: Abdomen is soft.  Genitourinary:    Labia:        Right: No rash, tenderness, lesion or injury.        Left: No rash, tenderness, lesion or injury.      Vagina: No vaginal discharge, tenderness or bleeding.     Cervix: Discharge (thick mucus-like discharge at os mixed with streaks of bright red blood) and cervical bleeding present. No friability or erythema.     Comments: Dilation: 1.5 Effacement (%): 50 Cervical Position: Middle Station: Ballotable Presentation: Undeterminable Exam by:: NN Neurological:     Mental Status: She is alert and oriented to person, place, and time.  Psychiatric:        Mood and Affect: Mood normal.        Behavior: Behavior normal.        Thought Content: Thought content normal.        Judgment: Judgment normal.   Prenatal labs: ABO, Rh: O/Positive/-- (05/12 1348) Antibody: Negative (05/12 1348) Rubella: 1.18 (05/12 1348) RPR: Non Reactive (05/12 1348)  HBsAg: Negative (05/12 1348)  HIV: Non Reactive (05/12 1348)  GBS:   not yet performed  Assessment/Plan: 1. Preterm contractions   2. Supervision of other normal pregnancy, antepartum   3. History of pre-eclampsia  in prior pregnancy, currently pregnant   4. Antepartum bleeding, first trimester   5. History of postpartum hemorrhage, currently pregnant   6. Variable fetal heart rate decelerations, antepartum   7. [redacted] weeks gestation of pregnancy   8. NST (non-stress test) reactive   9. Hypokalemia     -admit to [facility]  Orders Placed This Encounter  Procedures  . Wet prep, genital    Standing Status:   Standing    Number of Occurrences:   1  . Culture, OB Urine    Standing Status:   Standing    Number of Occurrences:   1  . 11-03-1990 MFM OB LIMITED    Standing Status:  Standing    Number of Occurrences:   1    Order Specific Question:   Symptom/Reason for Exam    Answer:   Variable fetal heart rate decelerations, antepartum [270623]  . Fetal fibronectin    Standing Status:   Standing    Number of Occurrences:   1  . CBC    Standing Status:   Standing    Number of Occurrences:   1  . Comprehensive metabolic panel    Standing Status:   Standing    Number of Occurrences:   1  . Protein / creatinine ratio, urine    Standing Status:   Standing    Number of Occurrences:   1  . Urinalysis, Routine w reflex microscopic    Standing Status:   Standing    Number of Occurrences:   1  . Diet regular Room service appropriate? Yes; Fluid consistency: Thin    Standing Status:   Standing    Number of Occurrences:   1    Order Specific Question:   Room service appropriate?    Answer:   Yes    Order Specific Question:   Fluid consistency:    Answer:   Thin  . Notify physician (specify)    Standing Status:   Standing    Number of Occurrences:   20    Order Specific Question:   Notify Physician    Answer:   for pulse less than 60 or greater than 120    Order Specific Question:   Notify Physician    Answer:   for respiratory rate less than 12 or greater than 28    Order Specific Question:   Notify Physician    Answer:   for temperature greater than 100.4    Order Specific Question:   Notify  Physician    Answer:   for urinary output less than 30 ml/hr    Order Specific Question:   Notify Physician    Answer:   for systolic BP less than 80 or greater than 140    Order Specific Question:   Notify Physician    Answer:   for diastolic BP less than 40 or greater than 90  . Vital signs    While awake, respect sleep.    Standing Status:   Standing    Number of Occurrences:   1  . Defer vaginal exam for vaginal bleeding or PROM <37 weeks    Standing Status:   Standing    Number of Occurrences:   1  . Initiate Oral Care Protocol    Standing Status:   Standing    Number of Occurrences:   1  . Initiate Carrier Fluid Protocol    Standing Status:   Standing    Number of Occurrences:   1  . Continuous tocometry    Standing Status:   Standing    Number of Occurrences:   1  . Fetal monitoring    Standing Status:   Standing    Number of Occurrences:   1  . Bed rest with bathroom privileges    Standing Status:   Standing    Number of Occurrences:   1  . Full code    Standing Status:   Standing    Number of Occurrences:   1  . Type and screen [facility]    [facility]     Standing Status:   Standing    Number of Occurrences:  1  . Insert peripheral IV    Standing Status:   Standing    Number of Occurrences:   1  . Place in observation (patient's expected length of stay will be less than 2 midnights)    Standing Status:   Standing    Number of Occurrences:   1    Order Specific Question:   Hospital Area    Answer:   [facility] [[id number]]    Order Specific Question:   Level of Care    Answer:   Antepartum [20]    Order Specific Question:   Covid Evaluation    Answer:   Asymptomatic Screening Protocol (No Symptoms)    Order Specific Question:   Diagnosis    Answer:   Preterm uterine contractions [705013]    Order Specific Question:   Admitting Physician    Answer:   Hermina Staggers [[id number]]    Order Specific Question:    Attending Physician    Answer:   Nettie Elm L [[id number]]    Meds ordered this encounter  Medications  . NIFEdipine (PROCARDIA) capsule 10 mg  . lactated ringers bolus 1,000 mL  . acetaminophen (TYLENOL) tablet 650 mg  . zolpidem (AMBIEN) tablet 5 mg  . docusate sodium (COLACE) capsule 100 mg  . calcium carbonate (TUMS - dosed in mg elemental calcium) chewable tablet 400 mg of elemental calcium  . prenatal multivitamin tablet 1 tablet  . lactated ringers 1,000 mL with potassium chloride 20 mEq infusion  . betamethasone acetate-betamethasone sodium phosphate (CELESTONE) injection 12 mg      Jacub Waiters E Nabila Albarracin 05/16/2020, 3:10 AM

## 2020-05-16 NOTE — Discharge Summary (Signed)
   Postpartum Discharge Summary     Patient Name: Chelsea Ferguson DOB: 12/23/1995 MRN: [id number]  Date of admission: 05/15/2020 Delivery date:05/16/2020  Delivering provider: KIM, RACHEL E  Date of discharge: 05/18/2020  Admitting diagnosis: Preterm uterine contractions [O47.00] Preterm labor [O60.00] Intrauterine pregnancy: [redacted]w[redacted]d     Secondary diagnosis:  Active Problems:   Preterm uterine contractions   Preterm delivery   SVD (spontaneous vaginal delivery)   Gestational hypertension   Preterm labor  Additional problems: Covid infection in second trimester, hx of pre-e, anxiety, IVF pregnancy    Discharge diagnosis: Preterm Pregnancy Delivered                                              Post partum procedures:None Augmentation: AROM Complications: None  Hospital course: Onset of Labor With Vaginal Delivery      24 y.o. yo G2P0101 at [redacted]w[redacted]d was admitted in Active Labor on 05/15/2020. Patient had an uncomplicated labor course as follows:  Membrane Rupture Time/Date: 10:20 AM ,05/16/2020   Delivery Method:Vaginal, Spontaneous  Episiotomy: None  Lacerations:  1st degree  Patient had an uncomplicated postpartum course.  She is ambulating, tolerating a regular diet, passing flatus, and urinating well. Patient is discharged home in stable condition on 05/18/20. Stable BP.  Newborn Data: Birth date:05/16/2020  Birth time:11:03 AM  Gender:Female  Living status:Living  Apgars: ,5  Weight:1540 g   Magnesium Sulfate received: Yes: Neuroprotection and PTL BMZ received: Yes Rhophylac:N/A MMR:N/A T-DaP:no Flu: No Transfusion:No  Physical exam  Vitals:   05/17/20 0741 05/17/20 1135 05/17/20 1509 05/18/20 0132  BP: 115/73 118/73 133/75 125/68  Pulse: 94 75 73 62  Resp: 18 18 18 18  Temp: 98.1 F (36.7 C) 97.9 F (36.6 C) 97.8 F (36.6 C) 97.6 F (36.4 C)  TempSrc: Oral Oral Oral Oral  SpO2: 99% 98% 100% 100%  Weight:      Height:       General: alert, cooperative and  no distress Lochia: appropriate Uterine Fundus: firm Incision: N/A DVT Evaluation: No evidence of DVT seen on physical exam. Negative Homan's sign. No cords or calf tenderness. Labs: Lab Results  Component Value Date   WBC 15.4 (H) 05/17/2020   HGB 8.3 (L) 05/17/2020   HCT 24.5 (L) 05/17/2020   MCV 89.4 05/17/2020   PLT 223 05/17/2020   CMP Latest Ref Rng & Units 05/17/2020  Glucose 70 - 99 mg/dL 102(H)  BUN 6 - 20 mg/dL <5(L)  Creatinine 0.44 - 1.00 mg/dL 0.69  Sodium 135 - 145 mmol/L 139  Potassium 3.5 - 5.1 mmol/L 3.2(L)  Chloride 98 - 111 mmol/L 107  CO2 22 - 32 mmol/L 21(L)  Calcium 8.9 - 10.3 mg/dL 8.1(L)  Total Protein 6.5 - 8.1 g/dL -  Total Bilirubin 0.3 - 1.2 mg/dL -  Alkaline Phos 38 - 126 U/L -  AST 15 - 41 U/L -  ALT 0 - 44 U/L -   Edinburgh Score: No flowsheet data found.   After visit meds:  Allergies as of 05/18/2020   No Known Allergies     Medication List    STOP taking these medications   aspirin 81 MG chewable tablet   Doxylamine-Pyridoxine 10-10 MG Tbec Commonly known as: Diclegis   famotidine 20 MG tablet Commonly known as: PEPCID   ondansetron 4 MG disintegrating tablet Commonly known as:   Zofran ODT   pantoprazole 40 MG tablet Commonly known as: Protonix   promethazine 25 MG suppository Commonly known as: Phenergan     TAKE these medications   escitalopram 10 MG tablet Commonly known as: LEXAPRO Take 10 mg by mouth daily.   ibuprofen 600 MG tablet Commonly known as: ADVIL Take 1 tablet (600 mg total) by mouth every 6 (six) hours as needed for moderate pain or cramping.   PRENATAL VITAMINS PO Take by mouth.            Discharge Care Instructions  (From admission, onward)         Start     Ordered   05/18/20 0000  Discharge wound care:       Comments: As per discharge handout and nursing instructions   05/18/20 0723           Discharge home in stable condition Infant Feeding: Breast Infant  Disposition:NICU Discharge instruction: per After Visit Summary and Postpartum booklet. Activity: Advance as tolerated. Pelvic rest for 6 weeks.  Diet: routine diet Follow up Visit:  Follow-up Information    [facility] [place] Follow up in 1 week(s).   Specialty: Obstetrics and Gynecology Why: BP check Contact information: [address] [place]  [id number] [phone]             Anticipated Birth Control:  Unsure   05/18/2020 Ugonna Anyanwu, MD   

## 2020-05-16 NOTE — Progress Notes (Signed)
4 g bolus of magnesium is in progress. Carmelina Dane, RN

## 2020-05-16 NOTE — Progress Notes (Signed)
Patient ID: Chelsea Ferguson, female   DOB: [birth year], 24 y.o.   MRN: [id number] Called by nursing reporting pt very uncomfortable/ SVE by nursing 5 cm Will start magnesium and transfer to L & D for further eval and management

## 2020-05-16 NOTE — MAU Note (Signed)
Critical value  Potassium 2.7 Reported to N. Nugent,NP

## 2020-05-16 NOTE — Lactation Note (Signed)
This note was copied from a baby's chart. Lactation Consultation Note  Patient Name: Chelsea Ferguson [id number] Date: 05/16/2020 Reason for consult: Initial assessment;NICU baby;Preterm <34wks;Infant < 6lbs LC went to visit mom in her room and she was at baby's bedside. Mom with hx infertility and PCOS. Luanna Salk IVF pregnancy.  Baby Chelsea Kinnie Scales now 6 hours old and mom initiating pumping for first time by Titus's bedside.   Mom pumping one breast at a time to stay covered so she can watch watch is being done to [place]. Urged mom to Double pump when able.  Discussed adding massage and hand expression to pumping. Urged to pump 8-12 times day. Mom has a Medela pump for home use.  Mom reports it is the same pump she used with her last baby and it was actually given to her. Discussed pump options.  Mom has private insurance.  Discussed seeing if her insurance provider would rent her a multiuser/hospital DEBP. Discussed Medicaid/WIC.  Mom reports her last baby who was also born early got medicaid.  Did not discuss cleaning/washing pump parts at this time.  Mom very distracted.  Asked mom how pumping felt.  Moms nipple really not moving in flange.  Using 24 mm.  Urged her to try 27 mm. Mom reports more comfort.  Reviewed pump settings.  Discussed coconut oil and colostrum containers.  Mom reports she would like some.  LC let her know she would leave at moms bedside.  LC also left [facility] handout.   Maternal Data Has patient been taught Hand Expression?: Yes Does the patient have breastfeeding experience prior to this delivery?: Yes  Feeding    LATCH Score                   Interventions Interventions: Breast massage;Hand express;Expressed milk;Coconut oil;Hand pump;DEBP  Lactation Tools Discussed/Used WIC Program: No Pump Review: Setup, frequency, and cleaning Initiated by:: RN Date initiated:: 05/16/20   Consult Status Consult Status:  Follow-up Date: 05/17/20 Follow-up type: In-patient    [facility] Michaelle Copas 05/16/2020, 5:55 PM

## 2020-05-16 NOTE — Anesthesia Procedure Notes (Signed)
Epidural Patient location during procedure: OB Start time: 05/16/2020 8:33 AM End time: 05/16/2020 8:43 AM  Staffing Anesthesiologist: Elmer Picker, MD Performed: anesthesiologist   Preanesthetic Checklist Completed: patient identified, IV checked, risks and benefits discussed, monitors and equipment checked, pre-op evaluation and timeout performed  Epidural Patient position: sitting Prep: DuraPrep and site prepped and draped Patient monitoring: continuous pulse ox, blood pressure, heart rate and cardiac monitor Approach: midline Location: L3-L4 Injection technique: LOR air  Needle:  Needle type: Tuohy  Needle gauge: 17 G Needle length: 9 cm Needle insertion depth: 6 cm Catheter type: closed end flexible Catheter size: 19 Gauge Catheter at skin depth: 12 cm Test dose: negative  Assessment Sensory level: T8 Events: blood not aspirated, injection not painful, no injection resistance, no paresthesia and negative IV test  Additional Notes Patient identified. Risks/Benefits/Options discussed with patient including but not limited to bleeding, infection, nerve damage, paralysis, failed block, incomplete pain control, headache, blood pressure changes, nausea, vomiting, reactions to medication both or allergic, itching and postpartum back pain. Confirmed with bedside nurse the patient's most recent platelet count. Confirmed with patient that they are not currently taking any anticoagulation, have any bleeding history or any family history of bleeding disorders. Patient expressed understanding and wished to proceed. All questions were answered. Sterile technique was used throughout the entire procedure. Please see nursing notes for vital signs. Test dose was given through epidural catheter and negative prior to continuing to dose epidural or start infusion. Warning signs of high block given to the patient including shortness of breath, tingling/numbness in hands, complete motor block,  or any concerning symptoms with instructions to call for help. Patient was given instructions on fall risk and not to get out of bed. All questions and concerns addressed with instructions to call with any issues or inadequate analgesia.  Reason for block:procedure for pain

## 2020-05-16 NOTE — Progress Notes (Signed)
Called to patient's room for increased pain and pelvic pressure. Patient requesting pain medication. Dr. Alysia Penna notified and this RN was instructed to do a vaginal exam. Pt is 5cm and reported back to Dr. Alysia Penna. Instructed to start Magnesium Sulfate, 4g bolus and 2g per hour. Pt transferred to Labor & delivery. Carmelina Dane, RN

## 2020-05-16 NOTE — Progress Notes (Signed)
Pt is experiencing pulse rates of 130' and 140's. Dr. Alysia Penna notified. No new orders given, Carmelina Dane, RN

## 2020-05-16 NOTE — Progress Notes (Signed)
FHR and maternal HR 140s, recommend to AROM and FSE for accurate assessment of FHR.  AROM: clear fluid.  SVE: 8.5/100/+1 D/c MgSO4, anticipate rapid delivery FSE: 130 bpm, mod variability, no accels, +variable decels EKG: sinus tachycardia Afebrile but Leukocytosis, suspect infectious process. Continue Amp, if meets criteria for Triple will start [facility].

## 2020-05-17 LAB — CULTURE, OB URINE: Culture: NO GROWTH

## 2020-05-17 LAB — BASIC METABOLIC PANEL
Anion gap: 11 (ref 5–15)
BUN: <5 mg/dL — ABNORMAL LOW (ref 6–20)
CO2: 21 mmol/L — ABNORMAL LOW (ref 22–32)
Calcium: 8.1 mg/dL — ABNORMAL LOW (ref 8.9–10.3)
Chloride: 107 mmol/L (ref 98–111)
Creatinine, Ser: 0.69 mg/dL (ref 0.44–1.00)
GFR, Estimated: >60 mL/min (ref >60)
Glucose, Bld: 102 mg/dL — ABNORMAL HIGH (ref 70–99)
Potassium: 3.2 mmol/L — ABNORMAL LOW (ref 3.5–5.1)
Sodium: 139 mmol/L (ref 135–145)

## 2020-05-17 LAB — CBC
HCT: 24.5 % — ABNORMAL LOW (ref 36.0–46.0)
Hemoglobin: 8.3 g/dL — ABNORMAL LOW (ref 12.0–15.0)
MCH: 30.3 pg (ref 26.0–34.0)
MCHC: 33.9 g/dL (ref 30.0–36.0)
MCV: 89.4 fL (ref 80.0–100.0)
Platelets: 223 10*3/uL (ref 150–400)
RBC: 2.74 MIL/uL — ABNORMAL LOW (ref 3.87–5.11)
RDW: 16.1 % — ABNORMAL HIGH (ref 11.5–15.5)
WBC: 15.4 10*3/uL — ABNORMAL HIGH (ref 4.0–10.5)
nRBC: 0 % (ref 0.0–0.2)

## 2020-05-17 LAB — RPR: RPR Ser Ql: NONREACTIVE

## 2020-05-17 NOTE — Progress Notes (Signed)
Post Partum Day 1 Subjective: Patient is doing well without complaints. She is ambulating, tolerating a regular diet and voiding.  Objective: Blood pressure 122/62, pulse 89, temperature 97.8 F (36.6 C), temperature source Oral, resp. rate 17, height 5\' 1"  (1.549 m), weight 78.5 kg, last menstrual period 10/17/2019, SpO2 98 %.  Physical Exam:  General: alert, cooperative and no distress Lochia: appropriate Uterine Fundus: firm DVT Evaluation: No evidence of DVT seen on physical exam.  Recent Labs    05/16/20 1351 05/17/20 0450  HGB 9.9* 8.3*  HCT 29.6* 24.5*    Assessment/Plan: Patient is doing well PPD#1 Continue current postpartum care   LOS: 1 day   Roniyah Llorens 05/17/2020, 7:38 AM

## 2020-05-17 NOTE — Anesthesia Postprocedure Evaluation (Signed)
Anesthesia Post Note  Patient: Chelsea Ferguson  Procedure(s) Performed: AN AD HOC LABOR EPIDURAL     Patient location during evaluation: Mother Baby Anesthesia Type: Epidural Level of consciousness: awake and alert Pain management: pain level controlled Vital Signs Assessment: post-procedure vital signs reviewed and stable Respiratory status: spontaneous breathing, nonlabored ventilation and respiratory function stable Cardiovascular status: stable Postop Assessment: no headache, no backache and epidural receding Anesthetic complications: no   No complications documented.  Last Vitals:  Vitals:   05/17/20 0533 05/17/20 0741  BP: 122/62 115/73  Pulse: 89 94  Resp: 17 18  Temp: 36.6 C 36.7 C  SpO2: 98% 99%    Last Pain:  Vitals:   05/17/20 0741  TempSrc: Oral  PainSc:    Pain Goal: Patients Stated Pain Goal: 2 (05/16/20 1331)                 Rica Records

## 2020-05-18 LAB — GC/CHLAMYDIA PROBE AMP (~~LOC~~) NOT AT ARMC
Chlamydia: NEGATIVE
Comment: NEGATIVE
Comment: NORMAL
Neisseria Gonorrhea: NEGATIVE

## 2020-05-18 MED ORDER — IBUPROFEN 600 MG PO TABS: 600.0000 mg | ORAL_TABLET | Freq: Four times a day (QID) | ORAL | 2 refills | Status: DC | PRN

## 2020-05-18 NOTE — Discharge Instructions (Signed)

## 2020-05-19 LAB — SURGICAL PATHOLOGY

## 2020-05-20 LAB — TYPE AND SCREEN
ABO/RH(D): O POS
Antibody Screen: NEGATIVE
Unit division: 0
Unit division: 0

## 2020-05-20 LAB — BPAM RBC
Blood Product Expiration Date: 202111022359
Blood Product Expiration Date: 202111022359
ISSUE DATE / TIME: 202110090321
Unit Type and Rh: 5100
Unit Type and Rh: 5100

## 2020-05-21 ENCOUNTER — Encounter: Payer: BC Managed Care – PPO | Admitting: Family Medicine

## 2020-05-26 ENCOUNTER — Ambulatory Visit: Payer: BC Managed Care – PPO | Admitting: Advanced Practice Midwife

## 2020-06-03 ENCOUNTER — Ambulatory Visit (INDEPENDENT_AMBULATORY_CARE_PROVIDER_SITE_OTHER): Payer: BC Managed Care – PPO | Admitting: Family Medicine

## 2020-06-03 ENCOUNTER — Ambulatory Visit: Payer: Self-pay

## 2020-06-03 ENCOUNTER — Encounter: Payer: Self-pay | Admitting: Family Medicine

## 2020-06-03 ENCOUNTER — Other Ambulatory Visit: Payer: Self-pay

## 2020-06-03 VITALS — BP 136/81 | HR 76 | Ht 61.0 in | Wt 157.0 lb

## 2020-06-03 DIAGNOSIS — F32A Depression, unspecified: Secondary | ICD-10-CM

## 2020-06-03 DIAGNOSIS — O139 Gestational [pregnancy-induced] hypertension without significant proteinuria, unspecified trimester: Secondary | ICD-10-CM

## 2020-06-03 DIAGNOSIS — F419 Anxiety disorder, unspecified: Secondary | ICD-10-CM

## 2020-06-03 NOTE — Progress Notes (Signed)
   Subjective:    Patient ID: Chelsea Ferguson, female    DOB: 11-17-1995, 24 y.o.   MRN: [id number]  HPI Patient is about 2 weeks postpartum and seen for BP and mood check. Her Edinburgh score was 10. She is on lexapro 10mg  daily and has increased it to 20mg  about 3 days ago. Denies headache.  Review of Systems     Objective:   Physical Exam Vitals reviewed.  Constitutional:      Appearance: Normal appearance.  Cardiovascular:     Pulses: Normal pulses.     Heart sounds: Normal heart sounds.  Pulmonary:     Effort: Pulmonary effort is normal.     Breath sounds: Normal breath sounds.  Neurological:     Mental Status: She is alert.  Psychiatric:        Mood and Affect: Mood normal.        Thought Content: Thought content normal.        Judgment: Judgment normal.        Assessment & Plan:  1. Gestational hypertension, antepartum BP normal. Will continue to watch closely.  2. Anxiety and depression Continue lexapro 20mg  - it will take about 2-3 weeks for her to see a change. Discussed that this is the max dose and if it doesn't cover her, then may need to add another agent or switch.

## 2020-06-03 NOTE — Lactation Note (Signed)
This note was copied from a baby's chart. Lactation Consultation Note  Patient Name: Chelsea Ferguson [id number] Date: 06/03/2020 Reason for consult: Follow-up assessment;NICU baby;Preterm <34wks   LC to room for f/u visit. Mom continues to pump with increasing volumes. Mom believes that baby is demonstrating readiness cues to bf. Reviewed infant driven feeding and encouraged mom to reach out to RN for further information on scoring and infant's progress. Mom offered opportunity to ask questions and all concerns were addressed. LC will plan f/u care prn.  Consult Status Consult Status: Follow-up Date: 06/04/20 Follow-up type: In-patient    [name] 06/03/2020, 2:17 PM

## 2020-06-10 ENCOUNTER — Ambulatory Visit: Payer: Self-pay

## 2020-06-10 NOTE — Lactation Note (Addendum)
This note was copied from a baby's chart. Lactation Consultation Note  Patient Name: Chelsea Ferguson FGHWE'X Date: 06/10/2020  [facility] to room for f/u visit. Mother continues to pump with yield sufficient for baby's needs. Mother would like to challenge at the breast tomorrow. Spoke with RN: infant is under 34 weeks and readiness scores have not begun. Infant can practice "nuzzling" and sts at this time. Will plan visit accordingly.  Consult Status Consult Status: Follow-up Date: 06/11/20 Follow-up type: In-patient    [name] 06/10/2020, 2:43 PM

## 2020-06-11 ENCOUNTER — Ambulatory Visit: Payer: Self-pay

## 2020-06-11 NOTE — Lactation Note (Signed)
This note was copied from a baby's chart. Lactation Consultation Note  Patient Name: Chelsea Ferguson XTAVW'P Date: 06/11/2020 Reason for consult: Follow-up assessment;Preterm <34wks;NICU baby;Mother's request  LC to room for assistance with sts. No attempts to bf because baby is under 34 weeks CGA and IDF has not been initiated. Mom in recliner and baby placed on her chest with legs flexed and head turned to the side for clear airway. He demonstrated a few hand to mouth cues and head bobbing. Reviewed with mother that these are likely early feeding cues and bf attempts may be initiated in the coming weeks according to his prescribed feeding plan. Mother continues to pump and yields volume sufficient for baby's needs. Mother is aware of LC services and will request further assistance prn.    Consult Status Consult Status: Follow-up Date: 06/12/20 Follow-up type: In-patient    Chelsea Ferguson 06/11/2020, 11:31 AM

## 2020-06-16 ENCOUNTER — Ambulatory Visit: Payer: Self-pay

## 2020-06-16 NOTE — Lactation Note (Signed)
This note was copied from a baby's chart. Lactation Consultation Note  Patient Name: Boy Jeyda Siebel FWYOV'Z Date: 06/16/2020 Reason for consult: Follow-up assessment   LC Follow UP Visit:  RN requested assistance; mother with questions regarding a NS  This is a preterm baby born at 30+1 weeks with a CGA of 34+4 weeks.  Mother has purchased a NS of [employer] and brought it to the hospital.  After observing this shield, I explained why this may not be the best choice to start baby on.  I showed her our nipple shields and mother very willing to try our shield.  She stated that she had to use a NS with her first child.  At this time, baby is not ready to go to the breast.  SLP in room and discussed pacifier dips and initiating sucking on an empty nipple with explanation provided to mother.  Mother had asked SLP about beginning to bottle feed.  Baby will continue to be evaluated for nipple readiness.  I encouraged lots of STS and nuzzling at the breast as desired.    Mother also has a small scabbed area to her right nipple.  Discussed nipple care and provided comfort gels with instructions for use.  Observed mother pumping with #27 flanges and these appear to be the correct size at this time.  Stressed the importance of diligent breast/nipple observation and to let [facility] know if problems occur or mother has painful pumping.  She denies pain with pumping at this time.  Mother appreciative.  RN in room during my visit.   Maternal Data    Feeding Feeding Type: Breast Milk  LATCH Score                   Interventions    Lactation Tools Discussed/Used     Consult Status Consult Status: Follow-up Date: 06/17/20 Follow-up type: In-patient    Kania Regnier R Demaurion Dicioccio 06/16/2020, 12:21 PM

## 2020-06-17 ENCOUNTER — Ambulatory Visit: Payer: Self-pay

## 2020-06-17 NOTE — Lactation Note (Addendum)
This note was copied from a baby's chart. Lactation Consultation Note  Patient Name: Chelsea Ferguson POEUM'P Date: 06/17/2020 Reason for consult: Follow-up assessment  [phone] - [phone] - Lactation followed up with Chelsea Ferguson. She was pumping upon entry. The pumping flanges appeared tight. Her nipples extended well into the flange. We discussed sizing up toe 30 to see if the fit would be better. She states that she is not in any pain. I recommended that if her nipples are not healing today and tomorrow that we try a 30 (she had some trauma from a few days ago that is healing).  Chelsea Ferguson states that she pumps about an ounce/breast. At this session, it appeared that she was pumping a bit more (possibly 1.5 - 2 ounce) per breast.  Chelsea Ferguson appeared tearful upon entry. She states that last night she used the nipple shield provided by the [facility] yesterday. She states that it really helped and that things "clicked" with baby last night. He breast fed on one breast for 30 minutes. She states that she and her RN heard audible swallows and noted milk residue in the NS upon release.  After breast feeding, baby was gavage fed full amount, per Chelsea Ferguson. She stated that he had reflux, and as a result she was told "to stop breast feeding." Mom expressed that she is very upset to stop breast feeding. She feels his gavage amount is too high after he breast feeds.  The RN entered during our conversation. She stated that baby could "nuzzle" the breast today. Chelsea Ferguson had just pumped, and we discussed allowing baby to latch to the pumped breast until she received more guidance from her NP.  She had some excellent questions about feeding amounts; I asked her to save those for the NP today.  Follow up tomorrow recommended. I encouraged her to continue to pumping and tried to provide reassurance. Chelsea Ferguson was visibly upset and indicated it was due to being discouraged from breast  feeding.  Interventions Interventions: Breast feeding basics reviewed;DEBP  Lactation Tools Discussed/Used Pump Review: Setup, frequency, and cleaning   Consult Status Consult Status: Follow-up Date: 06/18/20 Follow-up type: In-patient    Walker Shadow 06/17/2020, 8:41 AM

## 2020-06-23 ENCOUNTER — Ambulatory Visit: Payer: Self-pay

## 2020-06-23 NOTE — Lactation Note (Signed)
This note was copied from a baby's chart. Lactation Consultation Note  Patient Name: Chelsea Ferguson [id number] Date: 06/23/2020 Reason for consult: Follow-up assessment;NICU baby  LC to room at 1200 per RN request. Baby sleeping soundly. Spoke with mother. She states that they bf well yesterday. RN to call [facility] when baby is ready to feed. LC to plan f/u visit at that time.     Consult Status Consult Status: Follow-up Date: 06/24/20    Chelsea Ferguson 06/23/2020, 12:07 PM

## 2020-06-23 NOTE — Lactation Note (Signed)
This note was copied from a baby's chart. Lactation Consultation Note  Patient Name: Boy Sigrid Schwebach FTDDU'[id number] Date: 06/23/2020 Reason for consult: Follow-up assessment;NICU baby   Returned to observe baby at breast. Baby sleeping at breast when [facility] arrived. Mom stated that baby bf for about 15 minutes with audible swallows. Mom prepumped. No milk visible in shield. Offered to return at next feeding opportunity.   Consult Status Consult Status: Follow-up Date: 06/24/20    [name] 06/23/2020, 2:19 PM

## 2020-06-24 ENCOUNTER — Ambulatory Visit: Payer: BC Managed Care – PPO

## 2020-06-24 ENCOUNTER — Ambulatory Visit (INDEPENDENT_AMBULATORY_CARE_PROVIDER_SITE_OTHER): Payer: BC Managed Care – PPO | Admitting: Family Medicine

## 2020-06-24 ENCOUNTER — Other Ambulatory Visit: Payer: Self-pay

## 2020-06-24 ENCOUNTER — Encounter: Payer: Self-pay | Admitting: Family Medicine

## 2020-06-24 DIAGNOSIS — O99893 Other specified diseases and conditions complicating puerperium: Secondary | ICD-10-CM

## 2020-06-24 DIAGNOSIS — N809 Endometriosis, unspecified: Secondary | ICD-10-CM

## 2020-06-24 DIAGNOSIS — O99285 Endocrine, nutritional and metabolic diseases complicating the puerperium: Secondary | ICD-10-CM

## 2020-06-24 DIAGNOSIS — E282 Polycystic ovarian syndrome: Secondary | ICD-10-CM

## 2020-06-24 MED ORDER — NORETHINDRONE 0.35 MG PO TABS: 1.0000 | ORAL_TABLET | Freq: Every day | ORAL | 3 refills | Status: AC

## 2020-06-24 MED ORDER — ESCITALOPRAM OXALATE 20 MG PO TABS: 20.0000 mg | ORAL_TABLET | Freq: Every day | ORAL | 3 refills | Status: AC

## 2020-06-24 NOTE — Progress Notes (Signed)
Post Partum Visit Note  Chelsea Ferguson is a 24 y.o. G59P0101 female who presents for a postpartum visit. She is 5 weeks postpartum following a normal spontaneous vaginal delivery.  I have fully reviewed the prenatal and intrapartum course. The delivery was at 30.1 gestational weeks.  Anesthesia: epidural. Postpartum course has been uneventful. Baby is doing well- still in NICU . Baby is feeding by breast (pumpin and feeding in NICU). Bleeding no bleeding. Bowel function is normal. Bladder function is normal. Patient is not sexually active. Contraception method is undecided (this was IVFpregnancy). Postpartum depression screening: positive (score 11- on lexapro currently).   The pregnancy intention screening data noted above was reviewed. Potential methods of contraception were discussed. The patient elected to proceed with Oral Contraceptive.    Edinburgh Postnatal Depression Scale - 06/24/20 1025      Edinburgh Postnatal Depression Scale:  In the Past 7 Days   I have been able to laugh and see the funny side of things. 0    I have looked forward with enjoyment to things. 1    I have blamed myself unnecessarily when things went wrong. 2    I have been anxious or worried for no good reason. 2    I have felt scared or panicky for no good reason. 1    Things have been getting on top of me. 2    I have been so unhappy that I have had difficulty sleeping. 1    I have felt sad or miserable. 1    I have been so unhappy that I have been crying. 1    The thought of harming myself has occurred to me. 0    Edinburgh Postnatal Depression Scale Total 11            The following portions of the patient's history were reviewed and updated as appropriate: allergies, current medications, past family history, past medical history, past social history, past surgical history and problem list.  Review of Systems Pertinent items are noted in HPI.    Objective:  BP 125/72   Pulse 76   Ht 5\' 1"   (1.549 m)   Wt 161 lb (73 kg)   LMP 10/17/2019   Breastfeeding Unknown   BMI 30.42 kg/m   General:  alert, cooperative and no distress  Lungs: clear to auscultation bilaterally  Heart:  regular rate and rhythm, S1, S2 normal, no murmur, click, rub or gallop  Abdomen: soft, non-tender; bowel sounds normal; no masses,  no organomegaly        Assessment:    Normal postpartum exam. Pap smear not done at today's visit.   Plan:   Essential components of care per ACOG recommendations:  1.  Mood and well being: Patient with positive depression screening today. Already on medication.  - Patient does not use tobacco. - hx of drug use? No    2. Infant care and feeding:  -Patient currently breastmilk feeding? Yes  -Social determinants of health (SDOH) reviewed in EPIC. No concerns  3. Sexuality, contraception and birth spacing - Patient does not want a pregnancy in the next year.  Desired family size is 2 children.  - Reviewed forms of contraception in tiered fashion. Patient desired oral progesterone-only contraceptive today.   - Discussed birth spacing of 18 months  4. Sleep and fatigue -Encouraged family/partner/community support of 4 hrs of uninterrupted sleep to help with mood and fatigue  5. Physical Recovery  - Discussed patients delivery and  complications - Patient had a left labial laceration, perineal healing reviewed. Patient expressed understanding - Patient is safe to resume physical and sexual activity  6.  Health Maintenance - Last pap smear done 12/18/19 and was normal with negative HPV.  7. Chronic disease - Patient has endometriosis and PCOS (polycstic ovaries, possibly elevated testosterone, but had normal cycles after first child). With breastfeeding and POP, will likely suppress menses. Switch to OCPs after breastfeeding is done.  Chelsea Heritage, DO [facility] [employer], [facility]

## 2020-06-26 MED ORDER — PREDNISONE 10 MG PO TABS: 10.0000 mg | ORAL_TABLET | ORAL | 0 refills | Status: DC

## 2020-06-27 ENCOUNTER — Ambulatory Visit: Payer: Self-pay

## 2020-06-27 NOTE — Lactation Note (Signed)
This note was copied from a baby's chart. Lactation Consultation Note  Patient Name: Chelsea Ferguson EUMPN'T Date: 06/27/2020    [facility] made 2 attempts to see mother and baby bf today. Will plan f/u tomorrow.    [name] 06/27/2020, 6:22 PM

## 2020-06-30 ENCOUNTER — Ambulatory Visit: Payer: Self-pay

## 2020-06-30 NOTE — Lactation Note (Signed)
This note was copied from a baby's chart. Lactation Consultation Note  Patient Name: Chelsea Ferguson CYELY'H Date: 06/30/2020 Reason for consult: Follow-up assessment;NICU baby  LC to room for follow up visit.  Baby has progressed to ad lib feedings. Mom is no longer pumping. Baby is efficiently removing milk, per mom, and she denies engorgement. Patient was provided with the opportunity to ask questions. All concerns were addressed.  Will plan follow up visit.   Interventions Interventions: Breast feeding basics reviewed  Lactation Tools Discussed/Used     Consult Status Consult Status: Follow-up Date: 07/01/20 Follow-up type: In-patient    [name] 06/30/2020, 5:18 PM

## 2020-07-03 ENCOUNTER — Ambulatory Visit: Payer: Self-pay

## 2020-07-03 NOTE — Lactation Note (Signed)
This note was copied from a baby's chart. Lactation Consultation Note  Patient Name: Chelsea Ferguson [id number]'U Date: 07/03/2020 Reason for consult: Follow-up assessment;NICU baby;Preterm <34wks  1410 - 1425 - I followed up with Chelsea Ferguson and her son, Chelsea Ferguson. She was breast feeding him in cradle hold upon entry. She reports that he's breast feeding well.   She may be discharged this weekend. I conducted discharge education and recommended that she continue to feed baby 8-12 times a day. She states that she will continue to supplement initially and see how it goes. She would like to exclusively breast feed.  I encouraged her to follow up by OP to continue on the course of breast feeding. I reviewed our support resources. Chelsea Ferguson is interested in a follow up call after discharge.  Chelsea Ferguson is doing some occasional pumping. Yesterday she states that she pumped one time and obtain 4 ounces/combined. Baby takes approximately 2 ounces by bottle at this time.   Plan: Breast feed 8-12 times a day on demand. Offer both breasts in a feeding. Supplement as per provider indications.  Feeding Feeding Type: Breast Fed  LATCH Score Latch: Grasps breast easily, tongue down, lips flanged, rhythmical sucking.  Audible Swallowing: Spontaneous and intermittent  Type of Nipple: Everted at rest and after stimulation  Comfort (Breast/Nipple): Soft / non-tender  Hold (Positioning): No assistance needed to correctly position infant at breast.  LATCH Score: 10  Interventions Interventions: Breast feeding basics reviewed (discharge education)  Lactation Tools Discussed/Used     Consult Status Consult Status: PRN Follow-up type: Call as needed    Walker Shadow 07/03/2020, 2:27 PM

## 2020-07-10 ENCOUNTER — Telehealth (INDEPENDENT_AMBULATORY_CARE_PROVIDER_SITE_OTHER): Payer: BC Managed Care – PPO | Admitting: Internal Medicine

## 2020-07-10 ENCOUNTER — Encounter: Payer: Self-pay | Admitting: Internal Medicine

## 2020-07-10 ENCOUNTER — Other Ambulatory Visit: Payer: Self-pay

## 2020-07-10 DIAGNOSIS — J0110 Acute frontal sinusitis, unspecified: Secondary | ICD-10-CM | POA: Diagnosis not present

## 2020-07-10 NOTE — Assessment & Plan Note (Signed)
Clearly has sinus symptoms but only 3-4 days Discussed that this is usually viral  Recommended COVID testing since this is possible--she plans to go to pharmacy to get this Discussed supportive care---tylenol, fluids Can try fluticasone nasal spray If worsening next week, would try empiric amoxil

## 2020-07-10 NOTE — Progress Notes (Signed)
Subjective:    Patient ID: Chelsea Ferguson, female    DOB: 1995-09-13, 24 y.o.   MRN: [id number]  HPI Video virtual visit for respiratory symptoms and possible sinus infection Identification done Reviewed billing and limitations and she gave consent Participants--patient in her home and I am in my office  Usually gets sinus infection this time of year Coughing, yellow mucus, headache, congestion Started about 3 days ago Has post nasal drip Temporal headache No SOB No fever  Has tried mucinex and sudafed--not helping  Current Outpatient Medications on File Prior to Visit  Medication Sig Dispense Refill  . escitalopram (LEXAPRO) 20 MG tablet Take 1 tablet (20 mg total) by mouth daily. 90 tablet 3  . norethindrone (ORTHO MICRONOR) 0.35 MG tablet Take 1 tablet (0.35 mg total) by mouth daily. 84 tablet 3   No current facility-administered medications on file prior to visit.    No Known Allergies  Past Medical History:  Diagnosis Date  . Anxiety and depression   . Gestational hypertension w/o significant proteinuria in 3rd trimester   . Headache   . History of postpartum hemorrhage, currently pregnant 12/18/2019  . History of pre-eclampsia in prior pregnancy, currently pregnant 12/18/2019   Begin baby ASA at 12 weeks (had bleeding in 1st trimester)  . Hypertension    during pregnancy   . IBS (irritable bowel syndrome)   . Infertility associated with anovulation 07/28/2014   Pregnancy conceived with IVF    . Infertility, female   . Obesity affecting pregnancy, antepartum 07/28/2014  . PCOS (polycystic ovarian syndrome)   . Preterm delivery 05/16/2020  . Severe preeclampsia 02/09/2015  . Supervision of normal first pregnancy 07/28/2014    [facility]renatal Labs  Dating  LMP c/w 6 week ultrasound Blood type: --/--/O POS (12/09 2142)  Genetic Screen 1 Screen: NT normal         AFP:  Not done           Antibody: Neg  Anatomic [place]  Normal  Rubella:  immune  GTT Third trimester:  134 RPR:   NR  TDaP vaccine 12/23/14 HBsAg:   Neg  Flu vaccine 07/14/14 HIV: NONREACTIVE (12/09 2142)   GBS  GBS: negative (01/26/15)  Contraception Undecided Pa  . Threatened preterm labor, antepartum 01/12/2015    Past Surgical History:  Procedure Laterality Date  . NO PAST SURGERIES      Family History  Problem Relation Age of Onset  . Diabetes Maternal Grandmother   . Hypertension Maternal Grandmother   . Hypertension Paternal Grandmother   . Cancer Neg Hx     Social History   Socioeconomic History  . Marital status: Married    Spouse name: Fayrene Fearing  . Number of children: 1  . Years of education: 69  . Highest education level: Associate degree: occupational, Scientist, product/process development, or vocational program  Occupational History  . Not on file  Tobacco Use  . Smoking status: Never Smoker  . Smokeless tobacco: Never Used  Vaping Use  . Vaping Use: Never used  Substance and Sexual Activity  . Alcohol use: No  . Drug use: No  . Sexual activity: Not Currently    Birth control/protection: None  Other Topics Concern  . Not on file  Social History Narrative  . Not on file   Social Determinants of Health   Financial Resource Strain:   . Difficulty of Paying Living Expenses: Not on file  Food Insecurity:   . Worried About [employer] of  Food in the Last Year: Not on file  . Ran Out of Food in the Last Year: Not on file  Transportation Needs:   . Lack of Transportation (Medical): Not on file  . Lack of Transportation (Non-Medical): Not on file  Physical Activity:   . Days of Exercise per Week: Not on file  . Minutes of Exercise per Session: Not on file  Stress:   . Feeling of Stress : Not on file  Social Connections:   . Frequency of Communication with Friends and Family: Not on file  . Frequency of Social Gatherings with Friends and Family: Not on file  . Attends Religious Services: Not on file  . Active Member of Clubs or Organizations: Not on file  . Attends Banker  Meetings: Not on file  . Marital Status: Not on file  Intimate Partner Violence:   . Fear of Current or Ex-Partner: Not on file  . Emotionally Abused: Not on file  . Physically Abused: Not on file  . Sexually Abused: Not on file   Review of Systems No N/V No loss of smell or taste No general pollen sensitivity Did have COVID during pregnancy--had monoclonal antibody infusion    Objective:   Physical Exam Constitutional:      Appearance: Normal appearance.  Pulmonary:     Effort: Pulmonary effort is normal. No respiratory distress.  Neurological:     Mental Status: She is alert.  Psychiatric:        Mood and Affect: Mood normal.            Assessment & Plan:

## 2020-07-14 ENCOUNTER — Telehealth: Payer: Self-pay | Admitting: Primary Care

## 2020-07-14 DIAGNOSIS — J0110 Acute frontal sinusitis, unspecified: Secondary | ICD-10-CM

## 2020-07-14 NOTE — Telephone Encounter (Signed)
Pt called back and stated that she is still feeling the same.

## 2020-07-14 NOTE — Telephone Encounter (Addendum)
Please thank her for the update.  What symptoms is she experiencing now? Is she feeling worse?

## 2020-07-14 NOTE — Telephone Encounter (Signed)
Patient states that she is following up from her virtual visit from last week. She states that she is not feeling any better. She is wanting something for cold. EM

## 2020-07-14 NOTE — Telephone Encounter (Signed)
Called not able to leave v/m will call back later.

## 2020-07-14 NOTE — Telephone Encounter (Signed)
Please advise 

## 2020-07-15 MED ORDER — BUSPIRONE HCL 5 MG PO TABS: 5.0000 mg | ORAL_TABLET | Freq: Two times a day (BID) | ORAL | 1 refills | Status: AC

## 2020-07-15 NOTE — Telephone Encounter (Signed)
Pt said her symptoms are congestion, headache, everything with sinus infection. She said she feels about the same. No improvement.

## 2020-07-15 NOTE — Telephone Encounter (Signed)
Pt called back wanted to know about the antibiotic

## 2020-07-16 DIAGNOSIS — B9789 Other viral agents as the cause of diseases classified elsewhere: Secondary | ICD-10-CM | POA: Diagnosis not present

## 2020-07-16 DIAGNOSIS — J988 Other specified respiratory disorders: Secondary | ICD-10-CM | POA: Diagnosis not present

## 2020-07-16 MED ORDER — AMOXICILLIN-POT CLAVULANATE 875-125 MG PO TABS: 1.0000 | ORAL_TABLET | Freq: Two times a day (BID) | ORAL | 0 refills | Status: AC

## 2020-07-16 NOTE — Telephone Encounter (Signed)
Noted, it looks like Dr. Alphonsus Sias evaluated her virtually.  Will send in Rx for Augmentin course to pharmacy.

## 2020-07-16 NOTE — Telephone Encounter (Signed)
Patient called in checking on status of antibiotic again. Please advise.

## 2020-07-16 NOTE — Addendum Note (Signed)
Addended by: Doreene Nest on: 07/16/2020 01:57 PM   Modules accepted: Orders

## 2020-08-08 IMAGING — CT CT ABDOMEN AND PELVIS WITH CONTRAST
2 of 4 series · 16 of 46 positions shown, 18 images · IV contrast (Isovue)
Comparison: 02/24/17

CLINICAL DATA: Acute abdominal pain. 6-7 episodes of emesis.
Diarrhea for 2 weeks.

EXAM:
CT ABDOMEN AND PELVIS WITH CONTRAST
TECHNIQUE: Multidetector CT imaging of the abdomen and pelvis was performed
using the standard protocol following bolus administration of
intravenous contrast.
CONTRAST:  100mL OMNIPAQUE IOHEXOL 300 MG/ML  SOLN

[Series 2: axial st · axial · 0.64mm/px · z∈[+78,+468]mm · 13 of 88 slices shown, 15 images]
[im 5/88  soft-tissue]
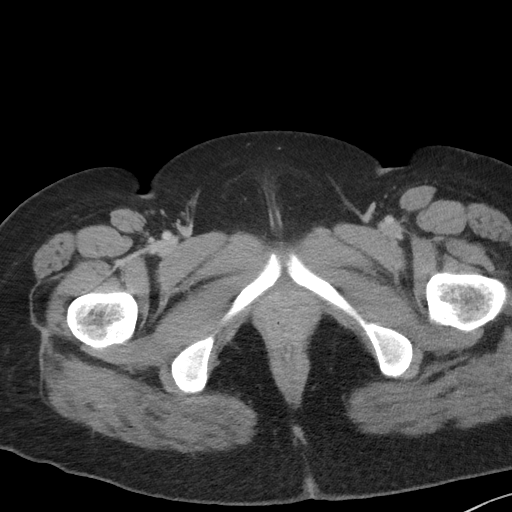
[im 5/88  bone]
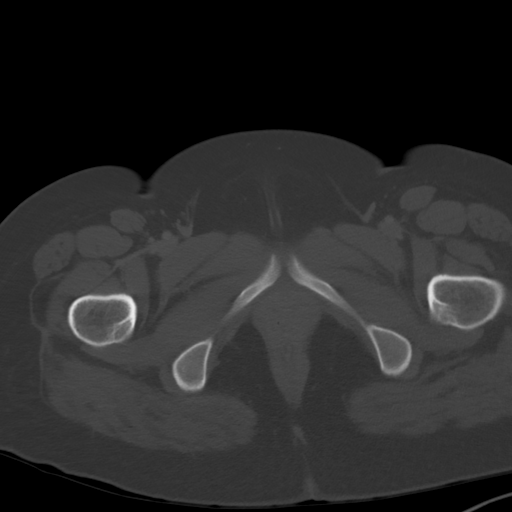
[im 10/88  soft-tissue]
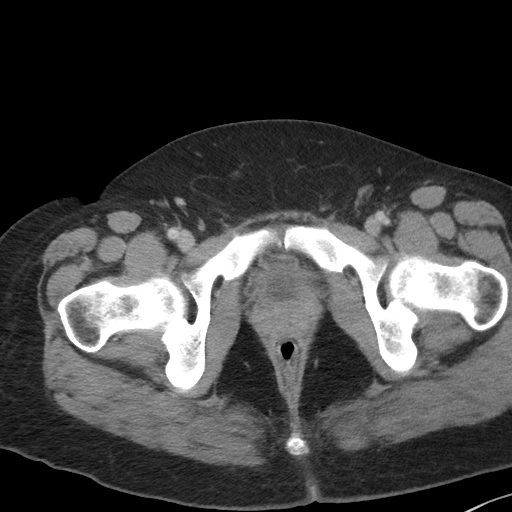
[im 20/88  soft-tissue]
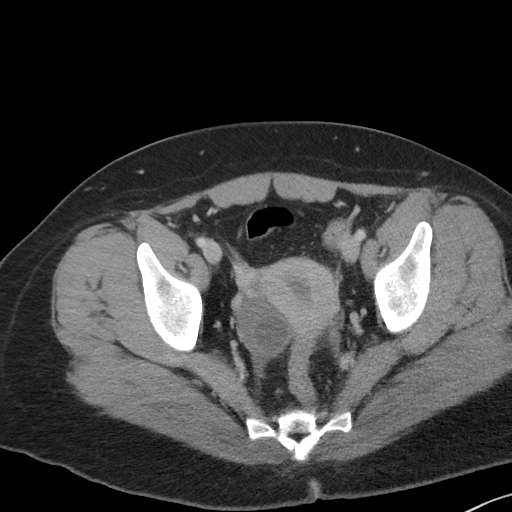
[im 25/88  soft-tissue]
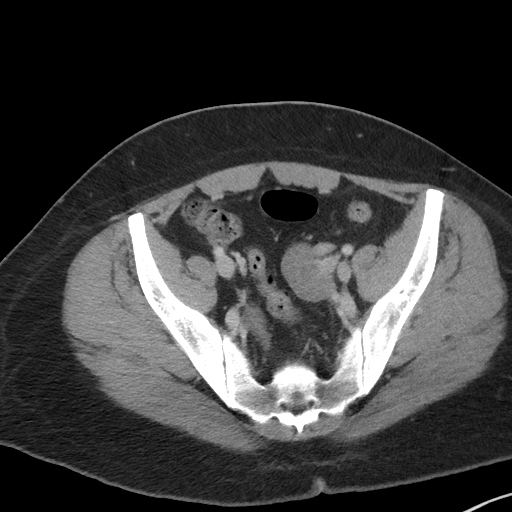
[im 30/88  soft-tissue]
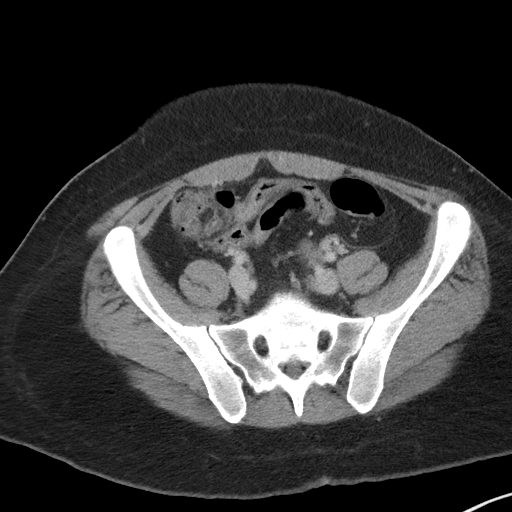
[im 39/88  soft-tissue]
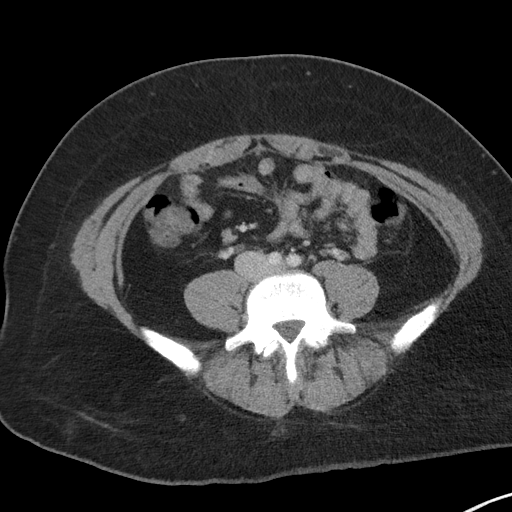
[im 44/88  soft-tissue]
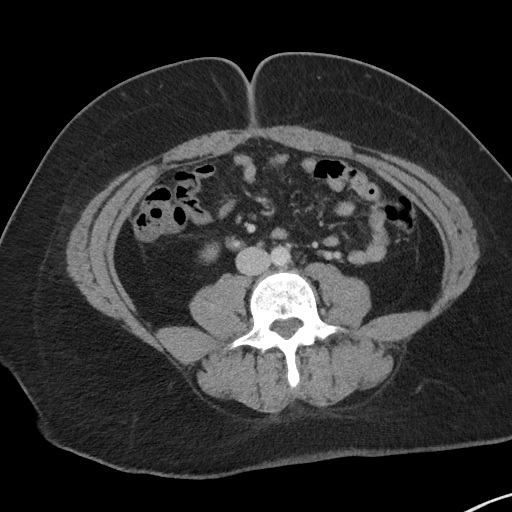
[im 49/88  soft-tissue]
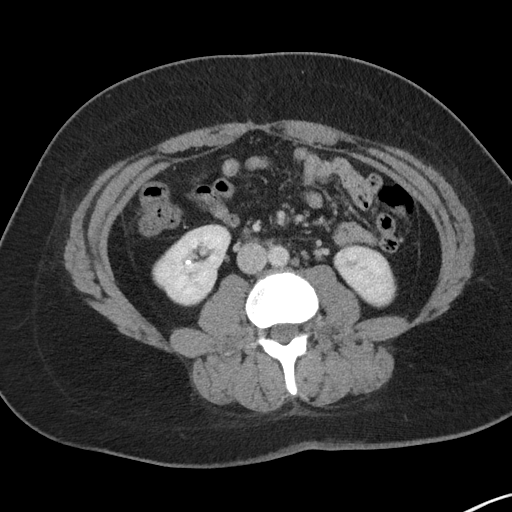
[im 59/88  soft-tissue]
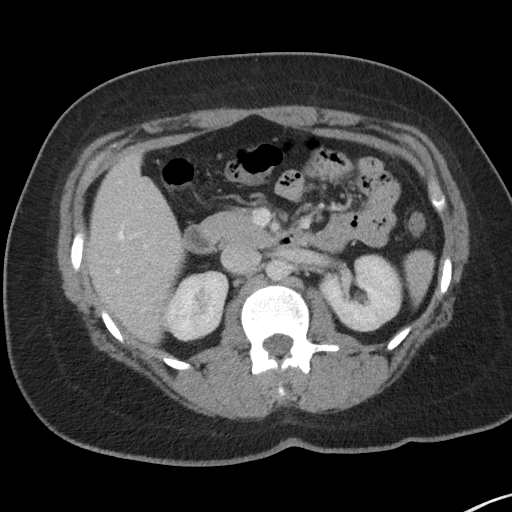
[im 59/88  bone]
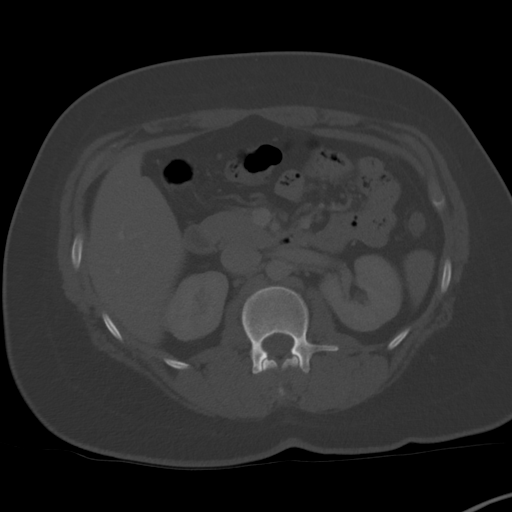
[im 63/88  soft-tissue]
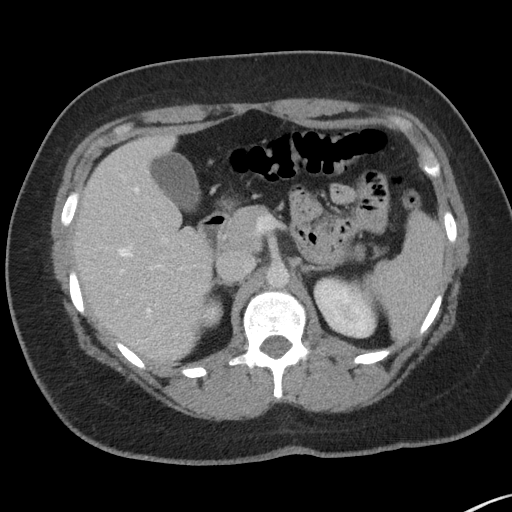
[im 68/88  soft-tissue]
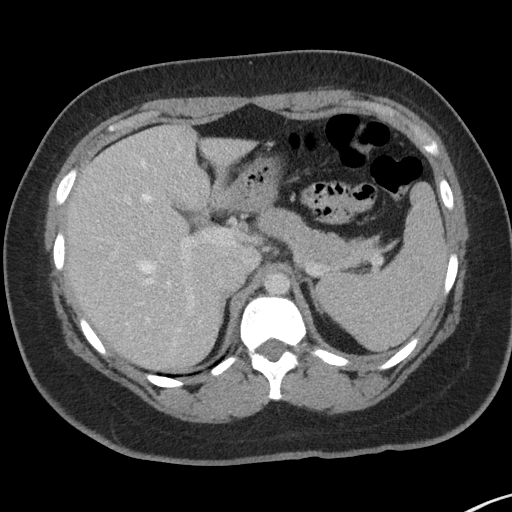
[im 78/88  soft-tissue]
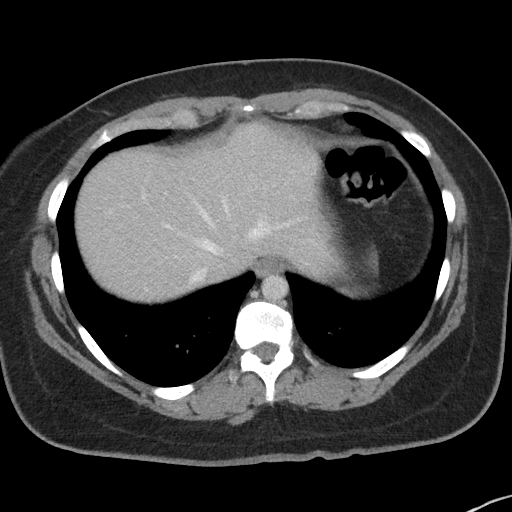
[im 83/88  soft-tissue]
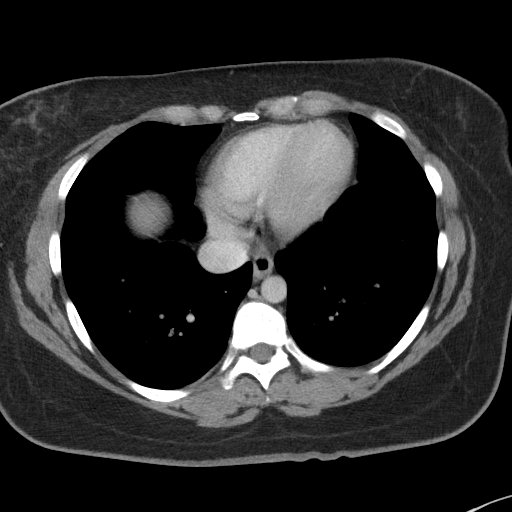

[Series 5: coronal st · coronal · 0.68mm/px · 3 of 85 slices shown]
[im 29/85  soft-tissue]
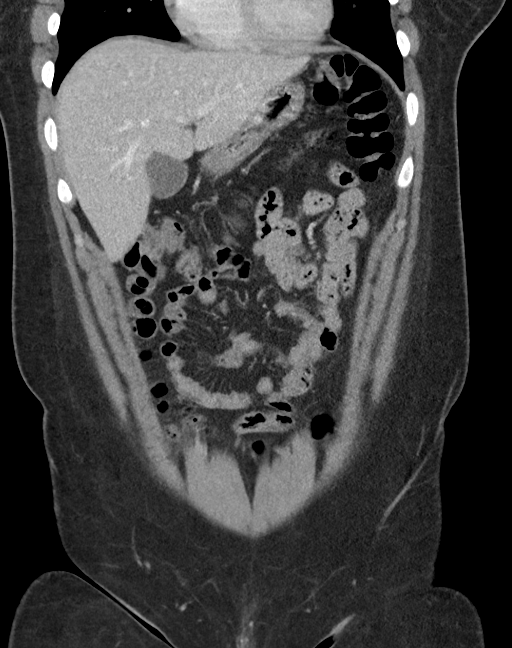
[im 38/85  soft-tissue]
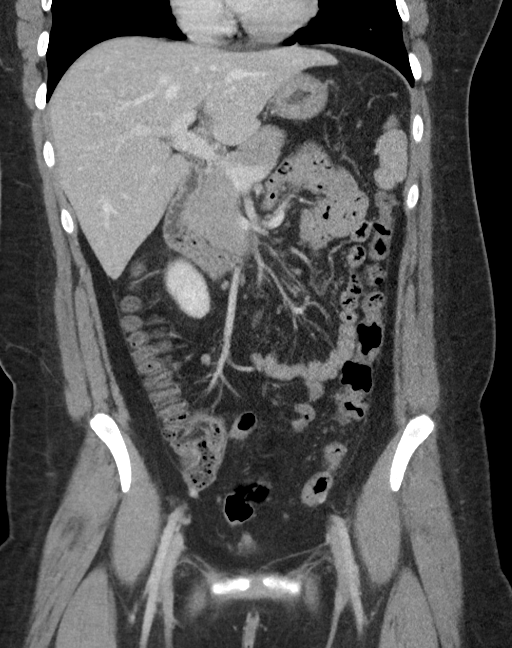
[im 47/85  soft-tissue]
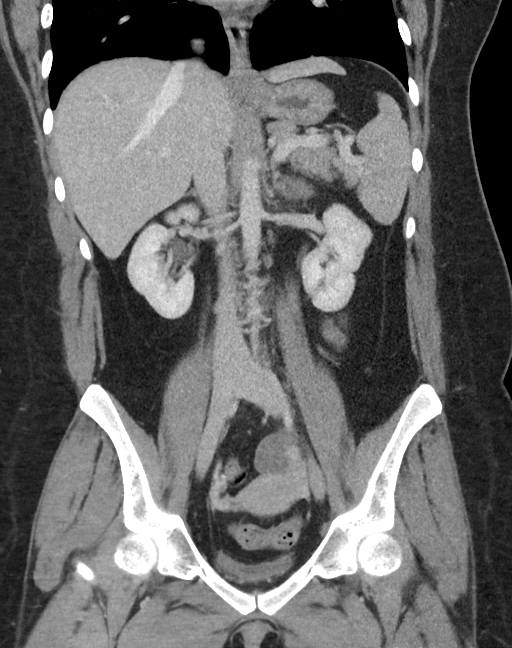

[16 of 46 positions shown; findings below may reference images not displayed]

FINDINGS: Lower chest:  No contributory findings.

Hepatobiliary: No focal liver abnormality.No evidence of biliary
obstruction or stone.

Pancreas: Unremarkable.

Spleen: Unremarkable.

Adrenals/Urinary Tract: Negative adrenals. No hydronephrosis or
ureteral stone. 3 mm right lower pole calculus. Collapsed bladder
which is otherwise unremarkable

Stomach/Bowel:  No obstruction. No appendicitis.

Vascular/Lymphatic: No acute vascular abnormality. No mass or
adenopathy.

Reproductive:Dominant follicle on the right. Prominent ovarian
volume which is symmetric and unchanged. No detected multiplicity of
follicles by CT.

Other: No ascites or pneumoperitoneum.

Musculoskeletal: No acute abnormalities.
IMPRESSION: 1. No emergent finding.  No visible bowel inflammation.
2. Right renal calculus.

## 2020-08-13 ENCOUNTER — Telehealth: Payer: Self-pay | Admitting: Primary Care

## 2020-08-13 NOTE — Telephone Encounter (Signed)
Pt called in wanted to know about why she hasnt received any medication and no one has called her back.

## 2020-08-13 NOTE — Telephone Encounter (Signed)
I saw her a month ago--and then she got augmentin the next week for persistent sinus symptoms What medication is she asking about??

## 2020-08-14 NOTE — Telephone Encounter (Signed)
Okay; thanks.

## 2020-08-14 NOTE — Telephone Encounter (Signed)
Advised pt if she did not pick up the script when it was sent then the pharmacy may have put it back on the shelf but she can ask them to fill it again. A new script does not need to be sent in again. Pt verbalized understanding.

## 2020-08-14 NOTE — Telephone Encounter (Signed)
Patient states she never picked up the augmentin. She is wondering if you can resend to the pharmacy. [facility] in [place], [place]

## 2020-10-14 DIAGNOSIS — M542 Cervicalgia: Secondary | ICD-10-CM | POA: Diagnosis not present

## 2020-10-14 DIAGNOSIS — M9901 Segmental and somatic dysfunction of cervical region: Secondary | ICD-10-CM | POA: Diagnosis not present

## 2020-10-14 DIAGNOSIS — M9902 Segmental and somatic dysfunction of thoracic region: Secondary | ICD-10-CM | POA: Diagnosis not present

## 2020-10-14 DIAGNOSIS — M9903 Segmental and somatic dysfunction of lumbar region: Secondary | ICD-10-CM | POA: Diagnosis not present

## 2020-10-15 DIAGNOSIS — M542 Cervicalgia: Secondary | ICD-10-CM | POA: Diagnosis not present

## 2020-10-15 DIAGNOSIS — M9902 Segmental and somatic dysfunction of thoracic region: Secondary | ICD-10-CM | POA: Diagnosis not present

## 2020-10-15 DIAGNOSIS — M9901 Segmental and somatic dysfunction of cervical region: Secondary | ICD-10-CM | POA: Diagnosis not present

## 2020-10-15 DIAGNOSIS — M9903 Segmental and somatic dysfunction of lumbar region: Secondary | ICD-10-CM | POA: Diagnosis not present

## 2020-11-17 DIAGNOSIS — M5000 Cervical disc disorder with myelopathy, unspecified cervical region: Secondary | ICD-10-CM | POA: Diagnosis not present

## 2020-11-19 ENCOUNTER — Other Ambulatory Visit: Payer: Self-pay | Admitting: Student

## 2020-11-19 DIAGNOSIS — M5000 Cervical disc disorder with myelopathy, unspecified cervical region: Secondary | ICD-10-CM

## 2020-12-04 ENCOUNTER — Ambulatory Visit
Admission: RE | Admit: 2020-12-04 | Discharge: 2020-12-04 | Disposition: A | Discharge status: Home / Self Care | Payer: BC Managed Care – PPO | Source: Ambulatory Visit | Attending: Student | Admitting: Student

## 2020-12-04 DIAGNOSIS — M4802 Spinal stenosis, cervical region: Secondary | ICD-10-CM | POA: Diagnosis not present

## 2020-12-04 DIAGNOSIS — M5000 Cervical disc disorder with myelopathy, unspecified cervical region: Secondary | ICD-10-CM

## 2021-01-27 DIAGNOSIS — R059 Cough, unspecified: Secondary | ICD-10-CM | POA: Diagnosis not present

## 2021-01-27 DIAGNOSIS — J069 Acute upper respiratory infection, unspecified: Secondary | ICD-10-CM | POA: Diagnosis not present

## 2021-01-27 DIAGNOSIS — J029 Acute pharyngitis, unspecified: Secondary | ICD-10-CM | POA: Diagnosis not present

## 2021-02-16 DIAGNOSIS — K219 Gastro-esophageal reflux disease without esophagitis: Secondary | ICD-10-CM | POA: Diagnosis not present

## 2021-02-16 DIAGNOSIS — M542 Cervicalgia: Secondary | ICD-10-CM | POA: Diagnosis not present

## 2021-02-16 DIAGNOSIS — E282 Polycystic ovarian syndrome: Secondary | ICD-10-CM | POA: Diagnosis not present

## 2021-02-16 DIAGNOSIS — M5412 Radiculopathy, cervical region: Secondary | ICD-10-CM | POA: Diagnosis not present

## 2021-03-08 DIAGNOSIS — M5414 Radiculopathy, thoracic region: Secondary | ICD-10-CM | POA: Diagnosis not present

## 2021-03-08 DIAGNOSIS — M5413 Radiculopathy, cervicothoracic region: Secondary | ICD-10-CM | POA: Diagnosis not present

## 2021-03-08 DIAGNOSIS — M9902 Segmental and somatic dysfunction of thoracic region: Secondary | ICD-10-CM | POA: Diagnosis not present

## 2021-03-08 DIAGNOSIS — M9901 Segmental and somatic dysfunction of cervical region: Secondary | ICD-10-CM | POA: Diagnosis not present

## 2021-03-16 DIAGNOSIS — M9902 Segmental and somatic dysfunction of thoracic region: Secondary | ICD-10-CM | POA: Diagnosis not present

## 2021-03-16 DIAGNOSIS — M9901 Segmental and somatic dysfunction of cervical region: Secondary | ICD-10-CM | POA: Diagnosis not present

## 2021-03-16 DIAGNOSIS — M5413 Radiculopathy, cervicothoracic region: Secondary | ICD-10-CM | POA: Diagnosis not present

## 2021-03-16 DIAGNOSIS — M5414 Radiculopathy, thoracic region: Secondary | ICD-10-CM | POA: Diagnosis not present

## 2021-03-19 DIAGNOSIS — F419 Anxiety disorder, unspecified: Secondary | ICD-10-CM | POA: Diagnosis not present

## 2021-03-19 DIAGNOSIS — E282 Polycystic ovarian syndrome: Secondary | ICD-10-CM | POA: Diagnosis not present

## 2021-03-19 DIAGNOSIS — F32A Depression, unspecified: Secondary | ICD-10-CM | POA: Diagnosis not present

## 2021-03-19 DIAGNOSIS — M5412 Radiculopathy, cervical region: Secondary | ICD-10-CM | POA: Diagnosis not present

## 2021-03-19 DIAGNOSIS — K219 Gastro-esophageal reflux disease without esophagitis: Secondary | ICD-10-CM | POA: Diagnosis not present

## 2021-03-22 DIAGNOSIS — R102 Pelvic and perineal pain: Secondary | ICD-10-CM | POA: Diagnosis not present

## 2021-03-23 DIAGNOSIS — Z124 Encounter for screening for malignant neoplasm of cervix: Secondary | ICD-10-CM | POA: Diagnosis not present

## 2021-03-23 DIAGNOSIS — N911 Secondary amenorrhea: Secondary | ICD-10-CM | POA: Diagnosis not present

## 2021-03-23 DIAGNOSIS — E282 Polycystic ovarian syndrome: Secondary | ICD-10-CM | POA: Diagnosis not present

## 2021-03-23 DIAGNOSIS — R102 Pelvic and perineal pain: Secondary | ICD-10-CM | POA: Diagnosis not present

## 2021-03-24 DIAGNOSIS — Z113 Encounter for screening for infections with a predominantly sexual mode of transmission: Secondary | ICD-10-CM | POA: Diagnosis not present

## 2021-03-24 DIAGNOSIS — Z118 Encounter for screening for other infectious and parasitic diseases: Secondary | ICD-10-CM | POA: Diagnosis not present

## 2021-03-24 DIAGNOSIS — Z124 Encounter for screening for malignant neoplasm of cervix: Secondary | ICD-10-CM | POA: Diagnosis not present

## 2021-03-24 DIAGNOSIS — R102 Pelvic and perineal pain: Secondary | ICD-10-CM | POA: Diagnosis not present

## 2021-03-24 DIAGNOSIS — N898 Other specified noninflammatory disorders of vagina: Secondary | ICD-10-CM | POA: Diagnosis not present

## 2021-04-19 DIAGNOSIS — M9901 Segmental and somatic dysfunction of cervical region: Secondary | ICD-10-CM | POA: Diagnosis not present

## 2021-04-19 DIAGNOSIS — M5413 Radiculopathy, cervicothoracic region: Secondary | ICD-10-CM | POA: Diagnosis not present

## 2021-04-19 DIAGNOSIS — M5414 Radiculopathy, thoracic region: Secondary | ICD-10-CM | POA: Diagnosis not present

## 2021-04-19 DIAGNOSIS — M9902 Segmental and somatic dysfunction of thoracic region: Secondary | ICD-10-CM | POA: Diagnosis not present

## 2021-04-21 DIAGNOSIS — K219 Gastro-esophageal reflux disease without esophagitis: Secondary | ICD-10-CM | POA: Diagnosis not present

## 2021-04-21 DIAGNOSIS — F419 Anxiety disorder, unspecified: Secondary | ICD-10-CM | POA: Diagnosis not present

## 2021-04-21 DIAGNOSIS — F32A Depression, unspecified: Secondary | ICD-10-CM | POA: Diagnosis not present

## 2021-04-21 DIAGNOSIS — M5412 Radiculopathy, cervical region: Secondary | ICD-10-CM | POA: Diagnosis not present

## 2021-05-10 DIAGNOSIS — N390 Urinary tract infection, site not specified: Secondary | ICD-10-CM | POA: Diagnosis not present

## 2021-05-10 DIAGNOSIS — H1031 Unspecified acute conjunctivitis, right eye: Secondary | ICD-10-CM | POA: Diagnosis not present

## 2021-05-10 DIAGNOSIS — R300 Dysuria: Secondary | ICD-10-CM | POA: Diagnosis not present

## 2021-05-24 DIAGNOSIS — M5412 Radiculopathy, cervical region: Secondary | ICD-10-CM | POA: Diagnosis not present

## 2021-05-24 DIAGNOSIS — Z79899 Other long term (current) drug therapy: Secondary | ICD-10-CM | POA: Diagnosis not present

## 2021-05-24 DIAGNOSIS — F419 Anxiety disorder, unspecified: Secondary | ICD-10-CM | POA: Diagnosis not present

## 2021-05-24 DIAGNOSIS — N390 Urinary tract infection, site not specified: Secondary | ICD-10-CM | POA: Diagnosis not present

## 2021-06-16 DIAGNOSIS — M5000 Cervical disc disorder with myelopathy, unspecified cervical region: Secondary | ICD-10-CM | POA: Diagnosis not present

## 2021-06-16 DIAGNOSIS — M50021 Cervical disc disorder at C4-C5 level with myelopathy: Secondary | ICD-10-CM | POA: Diagnosis not present

## 2021-07-09 DIAGNOSIS — M5000 Cervical disc disorder with myelopathy, unspecified cervical region: Secondary | ICD-10-CM | POA: Diagnosis not present

## 2021-07-20 DIAGNOSIS — M5413 Radiculopathy, cervicothoracic region: Secondary | ICD-10-CM | POA: Diagnosis not present

## 2021-07-20 DIAGNOSIS — M9902 Segmental and somatic dysfunction of thoracic region: Secondary | ICD-10-CM | POA: Diagnosis not present

## 2021-07-20 DIAGNOSIS — M5414 Radiculopathy, thoracic region: Secondary | ICD-10-CM | POA: Diagnosis not present

## 2021-07-20 DIAGNOSIS — M9901 Segmental and somatic dysfunction of cervical region: Secondary | ICD-10-CM | POA: Diagnosis not present

## 2021-07-26 DIAGNOSIS — M5414 Radiculopathy, thoracic region: Secondary | ICD-10-CM | POA: Diagnosis not present

## 2021-07-26 DIAGNOSIS — M5413 Radiculopathy, cervicothoracic region: Secondary | ICD-10-CM | POA: Diagnosis not present

## 2021-07-26 DIAGNOSIS — M9901 Segmental and somatic dysfunction of cervical region: Secondary | ICD-10-CM | POA: Diagnosis not present

## 2021-07-26 DIAGNOSIS — M9902 Segmental and somatic dysfunction of thoracic region: Secondary | ICD-10-CM | POA: Diagnosis not present

## 2024-10-25 ENCOUNTER — Ambulatory Visit: Admit: 2024-10-25 | Admitting: Obstetrics and Gynecology
